# Patient Record
Sex: Female | Born: 1990 | Race: White | Hispanic: No | Marital: Single | Smoking: Never smoker
Health system: Southern US, Community
[De-identification: ages and names within clinical notes are randomized; demographics above are authoritative.]

## PROBLEM LIST (undated history)

## (undated) ENCOUNTER — Inpatient Hospital Stay (HOSPITAL_COMMUNITY): Payer: Self-pay

## (undated) DIAGNOSIS — E119 Type 2 diabetes mellitus without complications: Secondary | ICD-10-CM

## (undated) DIAGNOSIS — F32A Depression, unspecified: Secondary | ICD-10-CM

## (undated) DIAGNOSIS — F419 Anxiety disorder, unspecified: Secondary | ICD-10-CM

## (undated) DIAGNOSIS — I10 Essential (primary) hypertension: Secondary | ICD-10-CM

## (undated) DIAGNOSIS — F319 Bipolar disorder, unspecified: Secondary | ICD-10-CM

## (undated) HISTORY — PX: ADENOIDECTOMY: SUR15

## (undated) HISTORY — PX: TONSILLECTOMY: SUR1361

## (undated) HISTORY — DX: Depression, unspecified: F32.A

## (undated) HISTORY — DX: Bipolar disorder, unspecified: F31.9

---
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
## 2013-11-16 ENCOUNTER — Ambulatory Visit: Payer: Self-pay | Admitting: *Deleted

## 2014-05-12 ENCOUNTER — Encounter (HOSPITAL_COMMUNITY): Payer: Self-pay | Admitting: Emergency Medicine

## 2014-05-12 ENCOUNTER — Emergency Department (HOSPITAL_COMMUNITY)
Admission: EM | Admit: 2014-05-12 | Discharge: 2014-05-12 | Disposition: A | Discharge status: Home / Self Care | Payer: BC Managed Care – PPO | Attending: Emergency Medicine | Admitting: Emergency Medicine

## 2014-05-12 ENCOUNTER — Emergency Department (HOSPITAL_COMMUNITY): Payer: BC Managed Care – PPO

## 2014-05-12 DIAGNOSIS — R059 Cough, unspecified: Secondary | ICD-10-CM

## 2014-05-12 DIAGNOSIS — J45909 Unspecified asthma, uncomplicated: Secondary | ICD-10-CM | POA: Insufficient documentation

## 2014-05-12 DIAGNOSIS — J159 Unspecified bacterial pneumonia: Secondary | ICD-10-CM | POA: Diagnosis not present

## 2014-05-12 DIAGNOSIS — I10 Essential (primary) hypertension: Secondary | ICD-10-CM

## 2014-05-12 DIAGNOSIS — E119 Type 2 diabetes mellitus without complications: Secondary | ICD-10-CM | POA: Insufficient documentation

## 2014-05-12 DIAGNOSIS — R197 Diarrhea, unspecified: Secondary | ICD-10-CM | POA: Diagnosis not present

## 2014-05-12 DIAGNOSIS — J189 Pneumonia, unspecified organism: Secondary | ICD-10-CM

## 2014-05-12 DIAGNOSIS — R05 Cough: Secondary | ICD-10-CM

## 2014-05-12 DIAGNOSIS — R1110 Vomiting, unspecified: Secondary | ICD-10-CM | POA: Insufficient documentation

## 2014-05-12 DIAGNOSIS — R590 Localized enlarged lymph nodes: Secondary | ICD-10-CM | POA: Insufficient documentation

## 2014-05-12 DIAGNOSIS — R509 Fever, unspecified: Secondary | ICD-10-CM | POA: Diagnosis present

## 2014-05-12 HISTORY — DX: Type 2 diabetes mellitus without complications: E11.9

## 2014-05-12 HISTORY — DX: Essential (primary) hypertension: I10

## 2014-05-12 MED ORDER — ALBUTEROL SULFATE HFA 108 (90 BASE) MCG/ACT IN AERS
2.0000 | INHALATION_SPRAY | Freq: Once | RESPIRATORY_TRACT | Status: AC
  Administered 2014-05-12: 2 via RESPIRATORY_TRACT
  Filled 2014-05-12: qty 6.7

## 2014-05-12 MED ORDER — AMOXICILLIN-POT CLAVULANATE 875-125 MG PO TABS: 2.0000 | ORAL_TABLET | Freq: Two times a day (BID) | ORAL | Status: DC

## 2014-05-12 MED ORDER — KETOROLAC TROMETHAMINE 30 MG/ML IJ SOLN
30.0000 mg | Freq: Once | INTRAMUSCULAR | Status: AC
  Administered 2014-05-12: 30 mg via INTRAVENOUS
  Filled 2014-05-12: qty 1

## 2014-05-12 MED ORDER — DOXYCYCLINE HYCLATE 100 MG PO TABS
100.0000 mg | ORAL_TABLET | Freq: Once | ORAL | Status: AC
  Administered 2014-05-12: 100 mg via ORAL
  Filled 2014-05-12: qty 1

## 2014-05-12 MED ORDER — SODIUM CHLORIDE 0.9 % IV BOLUS (SEPSIS)
1000.0000 mL | Freq: Once | INTRAVENOUS | Status: AC
  Administered 2014-05-12: 1000 mL via INTRAVENOUS

## 2014-05-12 MED ORDER — AMOXICILLIN-POT CLAVULANATE 875-125 MG PO TABS
2.0000 | ORAL_TABLET | Freq: Once | ORAL | Status: AC
  Administered 2014-05-12: 2 via ORAL
  Filled 2014-05-12: qty 2

## 2014-05-12 MED ORDER — DOXYCYCLINE HYCLATE 100 MG PO CAPS: 100.0000 mg | ORAL_CAPSULE | Freq: Two times a day (BID) | ORAL | Status: DC

## 2014-05-12 NOTE — Discharge Instructions (Signed)
Return to the emergency room with worsening of symptoms, new symptoms or with symptoms that are concerning , especially getting worse not better, developed chest pain, fevers, stiff neck, worsening headache, nausea/vomiting, visual changes or slurred speech, chest pain, shortness of breath, cough with thick colored mucous or blood Drink plenty of fluids with electrolytes especially Gatorade. OTC cold medications such as mucinex, nyquil, dayquil are recommended. Chloraseptic for sore throat. Make appointment with her primary care provider and one week for recheck and hypertension management. Take your lisinopril when you get home.

## 2014-05-12 NOTE — ED Provider Notes (Signed)
CSN: [id number]     Arrival date & time 05/12/14  [id number] History   First MD Initiated Contact with Patient 05/12/14 [phone]     Chief Complaint  Patient presents with  . Fever  . Emesis     (Consider location/radiation/quality/duration/timing/severity/associated sxs/prior Treatment) HPI  Summer Campbell is a 23 y.o. female with PMH of hypertension, diabetes presenting with 2 days of tactile fevers, chills, emesis and diarrhea. Emesis and diarrhea nonbloody. Stools non-tarry. No hematemesis. She also notes productive cough of thick mucus. No hemoptysis. Mother was similar symptoms. Patient has taken over-the-counter cold medicines with minimal relief. Patient with remote history of asthma but none of COPD and patient does not smoke. Patient denies chest pain, shortness of breath, abdominal pain. No abdominal surgeries. She has chronic back pain without acute worsening. Patient also found to be hypertensive. She is on lisinopril but does not take it because she forgets.   Past Medical History  Diagnosis Date  . Hypertension   . Diabetes mellitus without complication    Past Surgical History  Procedure Laterality Date  . Tonsillectomy    . Adenoidectomy     No family history on file. History  Substance Use Topics  . Smoking status: Never Smoker   . Smokeless tobacco: Not on file  . Alcohol Use: Yes     Comment: occassional   OB History    No data available     Review of Systems  Constitutional: Positive for fever and chills.  HENT: Positive for congestion and rhinorrhea.   Eyes: Negative for visual disturbance.  Respiratory: Positive for cough.   Cardiovascular: Negative for chest pain.  Gastrointestinal: Positive for nausea, vomiting and diarrhea.  Genitourinary: Negative for dysuria and hematuria.  Musculoskeletal: Negative for back pain and gait problem.  Skin: Negative for rash.  Neurological: Negative for weakness and headaches.      Allergies  Review of patient's  allergies indicates no known allergies.  Home Medications   Prior to Admission medications   Medication Sig Start Date End Date Taking? Authorizing Provider  cyclobenzaprine (FLEXERIL) 10 MG tablet Take 10 mg by mouth 3 (three) times daily as needed for muscle spasms.   Yes Historical Provider, MD  HYDROcodone-acetaminophen (NORCO/VICODIN) 5-325 MG per tablet Take 1 tablet by mouth every 6 (six) hours as needed for moderate pain.   Yes Historical Provider, MD  amoxicillin-clavulanate (AUGMENTIN) 875-125 MG per tablet Take 2 tablets by mouth 2 (two) times daily. 05/12/14   Louann SjogrenVictoria L Jarone Ostergaard, PA-C  doxycycline (VIBRAMYCIN) 100 MG capsule Take 1 capsule (100 mg total) by mouth 2 (two) times daily. 05/12/14   Benetta SparVictoria L Francois Elk, PA-C   BP 169/105 mmHg  Pulse 84  Temp(Src) 99.3 F (37.4 C) (Oral)  Resp 15  Ht 5\' 4"  (1.626 m)  Wt 357 lb (161.934 kg)  BMI 61.25 kg/m2  SpO2 96%  LMP 05/01/2014 Physical Exam  Constitutional: She appears well-developed and well-nourished. No distress.  HENT:  Head: Normocephalic and atraumatic.  Mouth/Throat: Mucous membranes are normal. Posterior oropharyngeal erythema present. No oropharyngeal exudate or posterior oropharyngeal edema.  Eyes: Conjunctivae and EOM are normal. Right eye exhibits no discharge. Left eye exhibits no discharge.  Neck: Normal range of motion. Neck supple.  Cardiovascular: Normal rate, regular rhythm and normal heart sounds.   Pulmonary/Chest: Effort normal and breath sounds normal. No respiratory distress. She has no wheezes. She has no rales.  Abdominal: Soft. Bowel sounds are normal. She exhibits no distension. There is no  tenderness.  Lymphadenopathy:    She has cervical adenopathy.  Neurological: She is alert.  Skin: Skin is warm and dry. She is not diaphoretic.  Nursing note and vitals reviewed.   ED Course  Procedures (including critical care time) Labs Review Labs Reviewed - No data to display  Imaging Review Dg  Chest 2 View  05/12/2014   CLINICAL DATA:  Cough.  EXAM: CHEST  2 VIEW  COMPARISON:  None.  FINDINGS: The heart size and mediastinal contours are within normal limits. No pneumothorax or pleural effusion is noted. Left lung is clear. Mild right middle lobe opacity is noted concerning for pneumonia. The visualized skeletal structures are unremarkable.  IMPRESSION: Probable mild right middle lobe pneumonia. Followup radiographs are recommended until resolution.   Electronically Signed   By: Roque LiasJames  Green M.D.   On: 05/12/2014 10:18     EKG Interpretation None      Meds given in ED:  Medications  ketorolac (TORADOL) 30 MG/ML injection 30 mg (30 mg Intravenous Given 05/12/14 0929)  sodium chloride 0.9 % bolus 1,000 mL (0 mLs Intravenous Stopped 05/12/14 1015)  albuterol (PROVENTIL HFA;VENTOLIN HFA) 108 (90 BASE) MCG/ACT inhaler 2 puff (2 puffs Inhalation Given 05/12/14 1052)  doxycycline (VIBRA-TABS) tablet 100 mg (100 mg Oral Given 05/12/14 1050)  amoxicillin-clavulanate (AUGMENTIN) 875-125 MG per tablet 2 tablet (2 tablets Oral Given 05/12/14 1050)    New Prescriptions   AMOXICILLIN-CLAVULANATE (AUGMENTIN) 875-125 MG PER TABLET    Take 2 tablets by mouth 2 (two) times daily.   DOXYCYCLINE (VIBRAMYCIN) 100 MG CAPSULE    Take 1 capsule (100 mg total) by mouth 2 (two) times daily.      MDM   Final diagnoses:  Cough  CAP (community acquired pneumonia)  Essential hypertension   Patient has been diagnosed with CAP via chest xray. Pt is not ill appearing, immunocompromised and therefore I feel like the they can be treated as an OP with abx therapy. Pt with DM and HTN. Will teat for comorbidities with augmentin and doxycycline. Pt has been advised to return to the ED if symptoms worsen or they do not improve. Pt verbalizes understanding and is agreeable with plan. Pt to follow up with PCP. Dose of doxycycline and augmentin in ED and albuterol for cough.  Patient noted to be hypertensive in the  emergency department.  No signs of hypertensive urgency. Pt to take her lisinopril when she gets home. Discussed with patient the need for close follow-up and management by their primary care physician.   Discussed return precautions with patient. Discussed all results and patient verbalizes understanding and agrees with plan.  Case has been discussed with Dr. Criss AlvineGoldston who agrees with the above plan and to discharge.        Louann SjogrenVictoria L Tola Meas, PA-C 05/12/14 1059  Audree CamelScott T Goldston, MD 05/13/14 1550

## 2014-05-12 NOTE — ED Notes (Signed)
Patient coming from home with fever, chills, vomiting and diarrhea with symptoms starting on Tuesday.

## 2015-05-15 ENCOUNTER — Encounter (HOSPITAL_COMMUNITY): Payer: Self-pay | Admitting: *Deleted

## 2015-05-15 DIAGNOSIS — Z792 Long term (current) use of antibiotics: Secondary | ICD-10-CM | POA: Diagnosis not present

## 2015-05-15 DIAGNOSIS — E119 Type 2 diabetes mellitus without complications: Secondary | ICD-10-CM | POA: Insufficient documentation

## 2015-05-15 DIAGNOSIS — Z3202 Encounter for pregnancy test, result negative: Secondary | ICD-10-CM | POA: Insufficient documentation

## 2015-05-15 DIAGNOSIS — N200 Calculus of kidney: Secondary | ICD-10-CM | POA: Diagnosis not present

## 2015-05-15 DIAGNOSIS — I10 Essential (primary) hypertension: Secondary | ICD-10-CM | POA: Insufficient documentation

## 2015-05-15 DIAGNOSIS — R109 Unspecified abdominal pain: Secondary | ICD-10-CM | POA: Diagnosis present

## 2015-05-15 LAB — URINALYSIS, ROUTINE W REFLEX MICROSCOPIC
BILIRUBIN URINE: NEGATIVE
Hgb urine dipstick: NEGATIVE
Ketones, ur: NEGATIVE mg/dL
Leukocytes, UA: NEGATIVE
Nitrite: NEGATIVE
PH: 6 (ref 5.0–8.0)
Protein, ur: NEGATIVE mg/dL
SPECIFIC GRAVITY, URINE: 1.03 (ref 1.005–1.030)

## 2015-05-15 LAB — COMPREHENSIVE METABOLIC PANEL
ALT: 23 U/L (ref 14–54)
ANION GAP: 8 (ref 5–15)
AST: 19 U/L (ref 15–41)
Albumin: 3.4 g/dL — ABNORMAL LOW (ref 3.5–5.0)
Alkaline Phosphatase: 90 U/L (ref 38–126)
BUN: 11 mg/dL (ref 6–20)
CHLORIDE: 102 mmol/L (ref 101–111)
CO2: 28 mmol/L (ref 22–32)
Calcium: 9 mg/dL (ref 8.9–10.3)
Creatinine, Ser: 1.28 mg/dL — ABNORMAL HIGH (ref 0.44–1.00)
GFR calc non Af Amer: 58 mL/min — ABNORMAL LOW (ref >60)
Glucose, Bld: 395 mg/dL — ABNORMAL HIGH (ref 65–99)
Potassium: 4.6 mmol/L (ref 3.5–5.1)
SODIUM: 138 mmol/L (ref 135–145)
Total Bilirubin: 0.3 mg/dL (ref 0.3–1.2)
Total Protein: 7.1 g/dL (ref 6.5–8.1)

## 2015-05-15 LAB — CBC
HCT: 43.2 % (ref 36.0–46.0)
HEMOGLOBIN: 14.2 g/dL (ref 12.0–15.0)
MCH: 28.4 pg (ref 26.0–34.0)
MCHC: 32.9 g/dL (ref 30.0–36.0)
MCV: 86.4 fL (ref 78.0–100.0)
Platelets: 269 10*3/uL (ref 150–400)
RBC: 5 MIL/uL (ref 3.87–5.11)
RDW: 12.7 % (ref 11.5–15.5)
WBC: 12.6 10*3/uL — ABNORMAL HIGH (ref 4.0–10.5)

## 2015-05-15 LAB — URINE MICROSCOPIC-ADD ON

## 2015-05-15 LAB — POC URINE PREG, ED: PREG TEST UR: NEGATIVE

## 2015-05-15 NOTE — ED Notes (Signed)
Patient presents with c/o left flank pain that travels around to the left lower front.  Denies urinary symptoms.  Also has not been taking her BP meds or diabetic meds

## 2015-05-16 ENCOUNTER — Encounter (HOSPITAL_COMMUNITY): Payer: Self-pay | Admitting: Emergency Medicine

## 2015-05-16 ENCOUNTER — Emergency Department (HOSPITAL_COMMUNITY): Payer: BLUE CROSS/BLUE SHIELD

## 2015-05-16 ENCOUNTER — Emergency Department (HOSPITAL_COMMUNITY)
Admission: EM | Admit: 2015-05-16 | Discharge: 2015-05-16 | Disposition: A | Discharge status: Home / Self Care | Payer: BLUE CROSS/BLUE SHIELD | Attending: Emergency Medicine | Admitting: Emergency Medicine

## 2015-05-16 DIAGNOSIS — R52 Pain, unspecified: Secondary | ICD-10-CM

## 2015-05-16 DIAGNOSIS — N200 Calculus of kidney: Secondary | ICD-10-CM

## 2015-05-16 MED ORDER — ONDANSETRON 8 MG PO TBDP: ORAL_TABLET | ORAL | Status: DC

## 2015-05-16 MED ORDER — FENTANYL CITRATE (PF) 100 MCG/2ML IJ SOLN
100.0000 ug | Freq: Once | INTRAMUSCULAR | Status: DC
  Filled 2015-05-16: qty 2

## 2015-05-16 MED ORDER — OXYCODONE-ACETAMINOPHEN 5-325 MG PO TABS
1.0000 | ORAL_TABLET | Freq: Once | ORAL | Status: AC
  Administered 2015-05-16: 1 via ORAL
  Filled 2015-05-16: qty 1

## 2015-05-16 MED ORDER — OXYCODONE-ACETAMINOPHEN 5-325 MG PO TABS: 1.0000 | ORAL_TABLET | Freq: Four times a day (QID) | ORAL | Status: DC | PRN

## 2015-05-16 MED ORDER — TAMSULOSIN HCL 0.4 MG PO CAPS
0.4000 mg | ORAL_CAPSULE | Freq: Every day | ORAL | Status: DC
  Administered 2015-05-16: 0.4 mg via ORAL
  Filled 2015-05-16: qty 1

## 2015-05-16 MED ORDER — DICLOFENAC SODIUM ER 100 MG PO TB24: 100.0000 mg | ORAL_TABLET | Freq: Every day | ORAL | Status: DC

## 2015-05-16 MED ORDER — TAMSULOSIN HCL 0.4 MG PO CAPS: 0.4000 mg | ORAL_CAPSULE | Freq: Every day | ORAL | Status: DC

## 2015-05-16 MED ORDER — AMLODIPINE BESYLATE 5 MG PO TABS
5.0000 mg | ORAL_TABLET | Freq: Once | ORAL | Status: AC
Start: 2015-05-16 — End: 2015-05-16
  Administered 2015-05-16: 5 mg via ORAL
  Filled 2015-05-16: qty 1

## 2015-05-16 MED ORDER — KETOROLAC TROMETHAMINE 60 MG/2ML IM SOLN
60.0000 mg | Freq: Once | INTRAMUSCULAR | Status: AC
  Administered 2015-05-16: 60 mg via INTRAMUSCULAR
  Filled 2015-05-16: qty 2

## 2015-05-16 NOTE — ED Notes (Signed)
Dr. Palumbo at the bedside.  

## 2015-05-16 NOTE — ED Provider Notes (Signed)
CSN: [id number]     Arrival date & time 05/15/15  2132 History  By signing my name below, I, Summer Campbell, attest that this documentation has been prepared under the direction and in the presence of Vinicio Lynk, MD . Electronically Signed: [name], Scribe. 05/16/2015. 1:51 AM.     Chief Complaint  Patient presents with  . Abdominal Pain  . Back Pain   Patient is a 24 y.o. female presenting with back pain. The history is provided by the patient. No language interpreter was used.  Back Pain Location:  Lumbar spine (Left sided) Quality:  Stabbing Radiates to: left groin. Pain severity:  Moderate Onset quality:  Gradual Timing:  Constant Progression:  Unchanged Chronicity:  New Context: not MVA   Relieved by:  Nothing Worsened by:  Nothing tried Ineffective treatments:  Ibuprofen, cold packs and heating pad Associated symptoms: no bladder incontinence, no bowel incontinence, no dysuria, no fever and no paresthesias   Risk factors: obesity      HPI Comments:  Summer Campbell is a 24 y.o. female with a history of HTN and DM, who presents to the Emergency Department complaining of moderate left sided back pain for ~ 4 days. She has applied a heating pad and ice  without relief. She has also taken ibuprofen without relief. Pt reports h/o back pain but notes pain today is dissimilar to past episodes due to the intensity and the localization of the pain. She denies diarrhea, constipation, hematuria, dysuria, and vaginal discharge. LNMP ~ 1 week ago. Last normal BM ~ 2300. No alleviating factors noted. Pt is supposed to be taking metformin and lisinopril but is non-complaint.  Past Medical History  Diagnosis Date  . Hypertension   . Diabetes mellitus without complication [facility])    Past Surgical History  Procedure Laterality Date  . Tonsillectomy    . Adenoidectomy     History reviewed. No pertinent family history. Social History  Substance Use Topics  . Smoking status: Never  Smoker   . Smokeless tobacco: Never Used  . Alcohol Use: Yes     Comment: occassional   OB History    No data available     Review of Systems  Constitutional: Negative for fever.  Gastrointestinal: Negative for bowel incontinence.  Genitourinary: Positive for flank pain. Negative for bladder incontinence, dysuria and hematuria.  Musculoskeletal: Positive for back pain.  Neurological: Negative for paresthesias.  All other systems reviewed and are negative.   Allergies  Review of patient's allergies indicates no known allergies.  Home Medications   Prior to Admission medications   Medication Sig Start Date End Date Taking? Authorizing Provider  amoxicillin-clavulanate (AUGMENTIN) 875-125 MG per tablet Take 2 tablets by mouth 2 (two) times daily. 05/12/14   Oswaldo Conroy, PA-C  cyclobenzaprine (FLEXERIL) 10 MG tablet Take 10 mg by mouth 3 (three) times daily as needed for muscle spasms.    Historical Provider, MD  doxycycline (VIBRAMYCIN) 100 MG capsule Take 1 capsule (100 mg total) by mouth 2 (two) times daily. 05/12/14   Oswaldo Conroy, PA-C  HYDROcodone-acetaminophen (NORCO/VICODIN) 5-325 MG per tablet Take 1 tablet by mouth every 6 (six) hours as needed for moderate pain.    Historical Provider, MD   BP 160/97 mmHg  Pulse 81  Temp(Src) 98.6 F (37 C) (Oral)  Resp 16  Ht  (1.626 m)  Wt 336 lb 1 oz (152.437 kg)  BMI 57.66 kg/m2  SpO2 98%  LMP 05/05/2015 Physical Exam  Constitutional: She  is oriented to person, place, and time. She appears well-developed and well-nourished. No distress.  HENT:  Head: Normocephalic and atraumatic.  Mouth/Throat: Oropharynx is clear and moist. No oropharyngeal exudate.  Moist mucous membranes   Eyes: Conjunctivae are normal. Pupils are equal, round, and reactive to light.  Neck: Normal range of motion. Neck supple. No JVD present.  Trachea midline  Cardiovascular: Normal rate, regular rhythm and normal heart sounds.    Pulmonary/Chest: Effort normal and breath sounds normal. No respiratory distress.  Abdominal: Soft. She exhibits no distension. There is no tenderness. There is no rebound and no guarding.  Hyperactive BS Gassy throughout   Musculoskeletal: Normal range of motion.  Neurological: She is alert and oriented to person, place, and time. She has normal reflexes.  Skin: Skin is warm and dry.  Psychiatric: She has a normal mood and affect. Her behavior is normal.  Nursing note and vitals reviewed.   ED Course  Procedures   DIAGNOSTIC STUDIES:  Oxygen Saturation is 100% on RA, normal by my interpretation.    COORDINATION OF CARE:  1:28 AM Discussed treatment plan with pt at bedside and pt agreed to plan.  Labs Review Labs Reviewed  COMPREHENSIVE METABOLIC PANEL - Abnormal; Notable for the following:    Glucose, Bld 395 (*)    Creatinine, Ser 1.28 (*)    Albumin 3.4 (*)    GFR calc non Af Amer 58 (*)    All other components within normal limits  CBC - Abnormal; Notable for the following:    WBC 12.6 (*)    All other components within normal limits  URINALYSIS, ROUTINE W REFLEX MICROSCOPIC (NOT AT [facility]) - Abnormal; Notable for the following:    Glucose, UA >1000 (*)    All other components within normal limits  URINE MICROSCOPIC-ADD ON - Abnormal; Notable for the following:    Squamous Epithelial / LPF 0-5 (*)    Bacteria, UA FEW (*)    All other components within normal limits  POC URINE PREG, ED    Imaging Review Ct Renal Stone Study  05/16/2015  CLINICAL DATA:  left flank pain radiating into back w/ n/v started 2 days ago and has been intermittent but got really sharp and stabbing this afternoon no h/o stones in past. EXAM: CT ABDOMEN AND PELVIS WITHOUT CONTRAST TECHNIQUE: Multidetector CT imaging of the abdomen and pelvis was performed following the standard protocol without IV contrast. COMPARISON:  None. FINDINGS: The lung bases are clear. Left kidney is enlarged. Mild  hydronephrosis and hydroureter on the left. Stone in the mid left ureter at the level of the sacrum measuring 6 mm diameter. There is infiltration around the ureter. Right kidney in ureter is decompressed and no stones are demonstrated on the right. No bladder stones or bladder wall thickening. The unenhanced appearance of the gallbladder, spleen, adrenal glands, abdominal aorta, inferior vena cava, and retroperitoneal lymph nodes is unremarkable. Mild diffuse fatty infiltration of the liver. Multiple accessory spleens. Stomach, small bowel, and colon are mostly decompressed. Pelvis: Uterus and ovaries are not enlarged. Appendix is normal. No free or loculated pelvic fluid collections. No pelvic mass or lymphadenopathy. No evidence of diverticulitis. Mild degenerative changes in the spine. No destructive bone lesions. IMPRESSION: 6 mm stone in the mid left ureter with moderate proximal obstruction. Electronically Signed   By: Burman Nieves M.D.   On: 05/16/2015 03:07   I have personally reviewed and evaluated these images and lab results as part of my medical  decision-making.   EKG Interpretation None      MDM   Final diagnoses:  Pain    Results for orders placed or performed during the hospital encounter of 05/16/15  Comprehensive metabolic panel  Result Value Ref Range   Sodium 138 135 - 145 mmol/L   Potassium 4.6 3.5 - 5.1 mmol/L   Chloride 102 101 - 111 mmol/L   CO2 28 22 - 32 mmol/L   Glucose, Bld 395 (H) 65 - 99 mg/dL   BUN 11 6 - 20 mg/dL   Creatinine, Ser 1.61 (H) 0.44 - 1.00 mg/dL   Calcium 9.0 8.9 - 09.6 mg/dL   Total Protein 7.1 6.5 - 8.1 g/dL   Albumin 3.4 (L) 3.5 - 5.0 g/dL   AST 19 15 - 41 U/L   ALT 23 14 - 54 U/L   Alkaline Phosphatase 90 38 - 126 U/L   Total Bilirubin 0.3 0.3 - 1.2 mg/dL   GFR calc non Af Amer 58 (L) >60 mL/min   GFR calc Af Amer >60 >60 mL/min   Anion gap 8 5 - 15  CBC  Result Value Ref Range   WBC 12.6 (H) 4.0 - 10.5 K/uL   RBC 5.00 3.87 -  5.11 MIL/uL   Hemoglobin 14.2 12.0 - 15.0 g/dL   HCT 04.5 40.9 - 81.1 %   MCV 86.4 78.0 - 100.0 fL   MCH 28.4 26.0 - 34.0 pg   MCHC 32.9 30.0 - 36.0 g/dL   RDW 91.4 78.2 - 95.6 %   Platelets 269 150 - 400 K/uL  Urinalysis, Routine w reflex microscopic (not at [facility])  Result Value Ref Range   Color, Urine YELLOW YELLOW   APPearance CLEAR CLEAR   Specific Gravity, Urine 1.030 1.005 - 1.030   pH 6.0 5.0 - 8.0   Glucose, UA >1000 (A) NEGATIVE mg/dL   Hgb urine dipstick NEGATIVE NEGATIVE   Bilirubin Urine NEGATIVE NEGATIVE   Ketones, ur NEGATIVE NEGATIVE mg/dL   Protein, ur NEGATIVE NEGATIVE mg/dL   Nitrite NEGATIVE NEGATIVE   Leukocytes, UA NEGATIVE NEGATIVE  Urine microscopic-add on  Result Value Ref Range   Squamous Epithelial / LPF 0-5 (A) NONE SEEN   WBC, UA 0-5 0 - 5 WBC/hpf   RBC / HPF 0-5 0 - 5 RBC/hpf   Bacteria, UA FEW (A) NONE SEEN  POC urine preg, ED (not at [facility])  Result Value Ref Range   Preg Test, Ur NEGATIVE NEGATIVE   Ct Renal Stone Study  05/16/2015  CLINICAL DATA:  left flank pain radiating into back w/ n/v started 2 days ago and has been intermittent but got really sharp and stabbing this afternoon no h/o stones in past. EXAM: CT ABDOMEN AND PELVIS WITHOUT CONTRAST TECHNIQUE: Multidetector CT imaging of the abdomen and pelvis was performed following the standard protocol without IV contrast. COMPARISON:  None. FINDINGS: The lung bases are clear. Left kidney is enlarged. Mild hydronephrosis and hydroureter on the left. Stone in the mid left ureter at the level of the sacrum measuring 6 mm diameter. There is infiltration around the ureter. Right kidney in ureter is decompressed and no stones are demonstrated on the right. No bladder stones or bladder wall thickening. The unenhanced appearance of the gallbladder, spleen, adrenal glands, abdominal aorta, inferior vena cava, and retroperitoneal lymph nodes is unremarkable. Mild diffuse fatty infiltration of the liver.  Multiple accessory spleens. Stomach, small bowel, and colon are mostly decompressed. Pelvis: Uterus and ovaries are not enlarged. Appendix  is normal. No free or loculated pelvic fluid collections. No pelvic mass or lymphadenopathy. No evidence of diverticulitis. Mild degenerative changes in the spine. No destructive bone lesions. IMPRESSION: 6 mm stone in the mid left ureter with moderate proximal obstruction. Electronically Signed   By: Burman NievesWilliam  Stevens M.D.   On: 05/16/2015 03:07    Medications  tamsulosin (FLOMAX) capsule 0.4 mg (0.4 mg Oral Given 05/16/15 0409)  fentaNYL (SUBLIMAZE) injection 100 mcg (100 mcg Intravenous Not Given 05/16/15 0407)  ketorolac (TORADOL) injection 60 mg (60 mg Intramuscular Given 05/16/15 0132)  amLODipine (NORVASC) tablet 5 mg (5 mg Oral Given 05/16/15 0132)  oxyCODONE-acetaminophen (PERCOCET/ROXICET) 5-325 MG per tablet 1 tablet (1 tablet Oral Given 05/16/15 0415)   Kidney stone: Strain all urine follow up in 7 days with urology.  Restart your metformin and lisinopril.  Zofran prn nausea voltaren, flomax and percocet.  No driving or drinking alcohol while on this medication.  Strict pain precautions given   I personally performed the services described in this documentation, which was scribed in my presence. The recorded information has been reviewed and is accurate.     Cy BlamerApril Simone Tuckey, MD 05/16/15 [phone]

## 2015-05-16 NOTE — ED Notes (Signed)
Pt also does not take her metformin

## 2015-05-16 NOTE — ED Notes (Signed)
Pt left with all her belongings and ambulated out of the treatment area.  

## 2015-05-16 NOTE — ED Notes (Signed)
Pt is on lisionpril but does not take it. Lengthy conversation about this with her

## 2016-07-08 DIAGNOSIS — E1165 Type 2 diabetes mellitus with hyperglycemia: Secondary | ICD-10-CM | POA: Diagnosis not present

## 2016-07-08 DIAGNOSIS — I10 Essential (primary) hypertension: Secondary | ICD-10-CM | POA: Diagnosis not present

## 2016-07-08 DIAGNOSIS — M79671 Pain in right foot: Secondary | ICD-10-CM | POA: Diagnosis not present

## 2016-07-18 ENCOUNTER — Ambulatory Visit (INDEPENDENT_AMBULATORY_CARE_PROVIDER_SITE_OTHER): Payer: Commercial Managed Care - HMO | Admitting: Podiatry

## 2016-07-18 ENCOUNTER — Telehealth: Payer: Self-pay | Admitting: *Deleted

## 2016-07-18 ENCOUNTER — Encounter: Payer: Self-pay | Admitting: Podiatry

## 2016-07-18 ENCOUNTER — Ambulatory Visit (INDEPENDENT_AMBULATORY_CARE_PROVIDER_SITE_OTHER): Payer: Commercial Managed Care - HMO

## 2016-07-18 VITALS — Ht 65.0 in | Wt 323.0 lb

## 2016-07-18 DIAGNOSIS — E118 Type 2 diabetes mellitus with unspecified complications: Secondary | ICD-10-CM

## 2016-07-18 DIAGNOSIS — E119 Type 2 diabetes mellitus without complications: Secondary | ICD-10-CM | POA: Diagnosis not present

## 2016-07-18 DIAGNOSIS — M779 Enthesopathy, unspecified: Secondary | ICD-10-CM | POA: Diagnosis not present

## 2016-07-18 DIAGNOSIS — M722 Plantar fascial fibromatosis: Secondary | ICD-10-CM | POA: Diagnosis not present

## 2016-07-18 NOTE — Patient Instructions (Addendum)
Plantar Fasciitis Rehab Ask your health care provider which exercises are safe for you. Do exercises exactly as told by your health care provider and adjust them as directed. It is normal to feel mild stretching, pulling, tightness, or discomfort as you do these exercises, but you should stop right away if you feel sudden pain or your pain gets worse. Do not begin these exercises until told by your health care provider. Stretching and range of motion exercises These exercises warm up your muscles and joints and improve the movement and flexibility of your foot. These exercises also help to relieve pain. Exercise A: Plantar fascia stretch 1. Sit with your left / right leg crossed over your opposite knee. 2. Hold your heel with one hand with that thumb near your arch. With your other hand, hold your toes and gently pull them back toward the top of your foot. You should feel a stretch on the bottom of your toes or your foot or both. 3. Hold this stretch for__________ seconds. 4. Slowly release your toes and return to the starting position. Repeat __________ times. Complete this exercise __________ times a day. Exercise B: Gastroc, standing 1. Stand with your hands against a wall. 2. Extend your left / right leg behind you, and bend your front knee slightly. 3. Keeping your heels on the floor and keeping your back knee straight, shift your weight toward the wall without arching your back. You should feel a gentle stretch in your left / right calf. 4. Hold this position for __________ seconds. Repeat __________ times. Complete this exercise __________ times a day. Exercise C: Soleus, standing 1. Stand with your hands against a wall. 2. Extend your left / right leg behind you, and bend your front knee slightly. 3. Keeping your heels on the floor, bend your back knee and slightly shift your weight over the back leg. You should feel a gentle stretch deep in your calf. 4. Hold this position for __________  seconds. Repeat __________ times. Complete this exercise __________ times a day. Exercise D: Gastrocsoleus, standing 1. Stand with the ball of your left / right foot on a step. The ball of your foot is on the walking surface, right under your toes. 2. Keep your other foot firmly on the same step. 3. Hold onto the wall or a railing for balance. 4. Slowly lift your other foot, allowing your body weight to press your heel down over the edge of the step. You should feel a stretch in your left / right calf. 5. Hold this position for __________ seconds. 6. Return both feet to the step. 7. Repeat this exercise with a slight bend in your left / right knee. Repeat __________ times with your left / right knee straight and __________ times with your left / right knee bent. Complete this exercise __________ times a day. Balance exercise This exercise builds your balance and strength control of your arch to help take pressure off your plantar fascia. Exercise E: Single leg stand 1. Without shoes, stand near a railing or in a doorway. You may hold onto the railing or door frame as needed. 2. Stand on your left / right foot. Keep your big toe down on the floor and try to keep your arch lifted. Do not let your foot roll inward. 3. Hold this position for __________ seconds. 4. If this exercise is too easy, you can try it with your eyes closed or while standing on a pillow. Repeat __________ times. Complete this exercise   __________ times a day. This information is not intended to replace advice given to you by your health care provider. Make sure you discuss any questions you have with your health care provider. Document Released: 05/20/2005 Document Revised: 01/23/2016 Document Reviewed: 04/03/2015 Elsevier Interactive Patient Education  2017 [employer]    Diabetes and Foot Care Diabetes may cause you to have problems because of poor blood supply (circulation) to your feet and legs. This may cause the  skin on your feet to become thinner, break easier, and heal more slowly. Your skin may become dry, and the skin may peel and crack. You may also have nerve damage in your legs and feet causing decreased feeling in them. You may not notice minor injuries to your feet that could lead to infections or more serious problems. Taking care of your feet is one of the most important things you can do for yourself. Follow these instructions at home:  Wear shoes at all times, even in the house. Do not go barefoot. Bare feet are easily injured.  Check your feet daily for blisters, cuts, and redness. If you cannot see the bottom of your feet, use a mirror or ask someone for help.  Wash your feet with warm water (do not use hot water) and mild soap. Then pat your feet and the areas between your toes until they are completely dry. Do not soak your feet as this can dry your skin.  Apply a moisturizing lotion or petroleum jelly (that does not contain alcohol and is unscented) to the skin on your feet and to dry, brittle toenails. Do not apply lotion between your toes.  Trim your toenails straight across. Do not dig under them or around the cuticle. File the edges of your nails with an emery board or nail file.  Do not cut corns or calluses or try to remove them with medicine.  Wear clean socks or stockings every day. Make sure they are not too tight. Do not wear knee-high stockings since they may decrease blood flow to your legs.  Wear shoes that fit properly and have enough cushioning. To break in new shoes, wear them for just a few hours a day. This prevents you from injuring your feet. Always look in your shoes before you put them on to be sure there are no objects inside.  Do not cross your legs. This may decrease the blood flow to your feet.  If you find a minor scrape, cut, or break in the skin on your feet, keep it and the skin around it clean and dry. These areas may be cleansed with mild soap and water.  Do not cleanse the area with peroxide, alcohol, or iodine.  When you remove an adhesive bandage, be sure not to damage the skin around it.  If you have a wound, look at it several times a day to make sure it is healing.  Do not use heating pads or hot water bottles. They may burn your skin. If you have lost feeling in your feet or legs, you may not know it is happening until it is too late.  Make sure your health care provider performs a complete foot exam at least annually or more often if you have foot problems. Report any cuts, sores, or bruises to your health care provider immediately. Contact a health care provider if:  You have an injury that is not healing.  You have cuts or breaks in the skin.  You have an ingrown  nail.  You notice redness on your legs or feet.  You feel burning or tingling in your legs or feet.  You have pain or cramps in your legs and feet.  Your legs or feet are numb.  Your feet always feel cold. Get help right away if:  There is increasing redness, swelling, or pain in or around a wound.  There is a red line that goes up your leg.  Pus is coming from a wound.  You develop a fever or as directed by your health care provider.  You notice a bad smell coming from an ulcer or wound. This information is not intended to replace advice given to you by your health care provider. Make sure you discuss any questions you have with your health care provider. Document Released: 05/17/2000 Document Revised: 10/26/2015 Document Reviewed: 10/27/2012 Elsevier Interactive Patient Education  2017 [employer].

## 2016-07-18 NOTE — Progress Notes (Signed)
   Subjective:    Patient ID: Summer Campbell, female    DOB: 07/16/1990, 26 y.o.   MRN: [id number]  HPI  26 year old female presents the office today for concerns her right foot pain, a mass in the bottom of her right foot which is been ongoing for several years. She states that she is diabetic and she is trying start taking better care of herself. She states that she's clustered dad and other family members had multiple complications diabetes and should to start to do things to help prevent this from happening. Her blood sugars have run in the mid 200s. She denies any recent injury or trauma to her right foot. She has noticed a knot on the arch which does get painful at times with pressure. Denies any recent treatment. No injury. No other complaints.  Review of Systems  HENT: Positive for sinus pressure, sneezing and sore throat.   Musculoskeletal: Positive for back pain.  All other systems reviewed and are negative.      Objective:   Physical Exam General: AAO x3, NAD  Dermatological: Skin is warm, dry and supple bilateral. Nails x 10 are well manicured; remaining integument appears unremarkable at this time. There are no open sores, no preulcerative lesions, no rash or signs of infection present.  Vascular: Dorsalis Pedis artery and Posterior Tibial artery pedal pulses are 2/4 bilateral with immedate capillary fill time. Pedal hair growth present.  There is no pain with calf compression, swelling, warmth, erythema.   Neruologic: Grossly intact via light touch bilateral. Vibratory intact via tuning fork bilateral. Protective threshold with Semmes Wienstein monofilament intact to all pedal sites bilateral.  Musculoskeletal: Firm palpable non-mobile soft tissue masses present on the right foot on the medial arch of the foot on the medial band of the plantar fascia. There is no pain to palpation to the area today however she states it only hurts occasionally with walking and pressure. There is  no overlying edema, erythema or any skin change. Muscular strength 5/5 in all groups tested bilateral.  Gait: Unassisted, Nonantalgic.     Assessment & Plan:  26 year old female right foot plantar fibroma; diabetes mellitus -Treatment options discussed including all alternatives, risks, and complications -Etiology of symptoms were discussed -X-rays were obtained and reviewed with the patient. No evidence of acute fracture. No evidence for body identified today. -Order to compound cream to include verapamil for the plantar fibroma on the right foot. Also discussed stretching exercises and icing. -Discussed a diabetic education class for her and she is willing to go. We will put ina referral for her. Also discussed exercise and the importance of maintaining a healthy lifestyle. She states that she needs help with this and some guidance.   Ovid CurdMatthew Wagoner, DPM

## 2016-07-19 MED ORDER — NONFORMULARY OR COMPOUNDED ITEM: 2 refills | Status: DC

## 2016-07-19 NOTE — Telephone Encounter (Addendum)
[facility], clinicals, and demographics. Had difficulty faxing referral. Britta MccreedyBarbara - [facility] states she received the pt's referral through the que.

## 2016-07-19 NOTE — Addendum Note (Signed)
Addended by: Alphia Kava'CONNELL, Verble Styron D on: 07/19/2016 09:12 AM   Modules accepted: Orders

## 2016-08-05 DIAGNOSIS — E1165 Type 2 diabetes mellitus with hyperglycemia: Secondary | ICD-10-CM | POA: Diagnosis not present

## 2016-08-05 DIAGNOSIS — I10 Essential (primary) hypertension: Secondary | ICD-10-CM | POA: Diagnosis not present

## 2016-08-05 DIAGNOSIS — E785 Hyperlipidemia, unspecified: Secondary | ICD-10-CM | POA: Diagnosis not present

## 2017-01-16 DIAGNOSIS — I10 Essential (primary) hypertension: Secondary | ICD-10-CM | POA: Diagnosis not present

## 2017-01-16 DIAGNOSIS — E785 Hyperlipidemia, unspecified: Secondary | ICD-10-CM | POA: Diagnosis not present

## 2017-01-16 DIAGNOSIS — Z23 Encounter for immunization: Secondary | ICD-10-CM | POA: Diagnosis not present

## 2017-01-16 DIAGNOSIS — E1165 Type 2 diabetes mellitus with hyperglycemia: Secondary | ICD-10-CM | POA: Diagnosis not present

## 2017-02-24 DIAGNOSIS — Z23 Encounter for immunization: Secondary | ICD-10-CM | POA: Diagnosis not present

## 2017-02-24 DIAGNOSIS — E1165 Type 2 diabetes mellitus with hyperglycemia: Secondary | ICD-10-CM | POA: Diagnosis not present

## 2017-02-24 DIAGNOSIS — I10 Essential (primary) hypertension: Secondary | ICD-10-CM | POA: Diagnosis not present

## 2017-03-05 DIAGNOSIS — E1165 Type 2 diabetes mellitus with hyperglycemia: Secondary | ICD-10-CM | POA: Diagnosis not present

## 2017-04-04 ENCOUNTER — Emergency Department (HOSPITAL_COMMUNITY)
Admission: EM | Admit: 2017-04-04 | Discharge: 2017-04-04 | Disposition: A | Discharge status: Home / Self Care | Payer: Worker's Compensation | Attending: Emergency Medicine | Admitting: Emergency Medicine

## 2017-04-04 ENCOUNTER — Encounter (HOSPITAL_COMMUNITY): Payer: Self-pay

## 2017-04-04 ENCOUNTER — Emergency Department (HOSPITAL_COMMUNITY): Payer: Worker's Compensation

## 2017-04-04 DIAGNOSIS — Z79899 Other long term (current) drug therapy: Secondary | ICD-10-CM | POA: Diagnosis not present

## 2017-04-04 DIAGNOSIS — E119 Type 2 diabetes mellitus without complications: Secondary | ICD-10-CM | POA: Insufficient documentation

## 2017-04-04 DIAGNOSIS — W19XXXA Unspecified fall, initial encounter: Secondary | ICD-10-CM

## 2017-04-04 DIAGNOSIS — M791 Myalgia, unspecified site: Secondary | ICD-10-CM

## 2017-04-04 DIAGNOSIS — I10 Essential (primary) hypertension: Secondary | ICD-10-CM | POA: Insufficient documentation

## 2017-04-04 DIAGNOSIS — M545 Low back pain, unspecified: Secondary | ICD-10-CM

## 2017-04-04 LAB — POC URINE PREG, ED: PREG TEST UR: NEGATIVE

## 2017-04-04 MED ORDER — CYCLOBENZAPRINE HCL 5 MG PO TABS: 5.0000 mg | ORAL_TABLET | Freq: Two times a day (BID) | ORAL | 0 refills | Status: DC | PRN

## 2017-04-04 MED ORDER — NAPROXEN 500 MG PO TABS: 500.0000 mg | ORAL_TABLET | Freq: Two times a day (BID) | ORAL | 0 refills | Status: DC

## 2017-04-04 NOTE — ED Triage Notes (Signed)
Pt reports tailbone and lower back pain secondary to a fall yesterday morning in which she slipped and landed on her bottom. She denies head injury, LOC. Pt is ambulatory.

## 2017-04-04 NOTE — ED Notes (Signed)
Patient verbalized understanding of discharge instructions and denies any further needs or questions at this time. VS stable. Patient ambulatory with steady gait.  

## 2017-04-04 NOTE — ED Provider Notes (Signed)
[facility] EMERGENCY DEPARTMENT Provider Note   CSN: [id number] Arrival date & time: 04/04/17  1009     History   Chief Complaint Chief Complaint  Patient presents with  . Fall    HPI Summer Campbell is a 26 y.o. female presenting with coccyx pain after a fall.  Patient states she was at work yesterday when she slipped on the ramp, landing on her tailbone and hitting her upper back.  She twisted so she did not hit her head, and landed on her arm.  She reports persistent coccyx pain since the fall.  It is worse when she is sitting or changing position, improved with standing.  She currently denies pain elsewhere including her upper back and arm.  She denies hitting her head.  Denies head or neck pain.  She denies numbness or tingling.  She denies loss of bowel or bladder control.  She has not taking anything for pain today.  She is ambulatory.   HPI  Past Medical History:  Diagnosis Date  . Diabetes mellitus without complication (HCC)   . Hypertension     There are no active problems to display for this patient.   Past Surgical History:  Procedure Laterality Date  . ADENOIDECTOMY    . TONSILLECTOMY      OB History    No data available       Home Medications    Prior to Admission medications   Medication Sig Start Date End Date Taking? Authorizing Provider  cyclobenzaprine (FLEXERIL) 5 MG tablet Take 1 tablet (5 mg total) by mouth 2 (two) times daily as needed for muscle spasms. 04/04/17   Mistie Adney, PA-C  Diclofenac Sodium CR (VOLTAREN-XR) 100 MG 24 hr tablet Take 1 tablet (100 mg total) by mouth daily. 05/16/15   Palumbo, April, MD  HYDROcodone-acetaminophen (NORCO/VICODIN) 5-325 MG per tablet Take 1 tablet by mouth every 6 (six) hours as needed for moderate pain.    [provider]  lisinopril-hydrochlorothiazide (PRINZIDE,ZESTORETIC) 10-12.5 MG tablet Take 1 tablet by mouth daily.    [provider]  metFORMIN  (GLUCOPHAGE) 500 MG tablet Take by mouth 2 (two) times daily with a meal.    [provider]  naproxen (NAPROSYN) 500 MG tablet Take 1 tablet (500 mg total) by mouth 2 (two) times daily with a meal. 04/04/17   Nickole Adamek, PA-C  NONFORMULARY OR COMPOUNDED ITEM [facility]: Scar Cream - Verapamil 10%, Pentoxifylline 5%, apply 1-2 grams to affected area 3-4 times daily. 07/19/16   Vivi Barrack, DPM  ondansetron (ZOFRAN ODT) 8 MG disintegrating tablet 8mg  ODT q8 hours prn nausea Patient not taking: Reported on 07/18/2016 05/16/15   Palumbo, April, MD  oxyCODONE-acetaminophen (PERCOCET) 5-325 MG tablet Take 1 tablet by mouth every 6 (six) hours as needed. Patient not taking: Reported on 07/18/2016 05/16/15   Palumbo, April, MD  rosuvastatin (CRESTOR) 10 MG tablet Take 10 mg by mouth daily.    [provider]  tamsulosin (FLOMAX) 0.4 MG CAPS capsule Take 1 capsule (0.4 mg total) by mouth daily. Patient not taking: Reported on 07/18/2016 05/16/15   Palumbo, April, MD  tiZANidine (ZANAFLEX) 2 MG tablet Take by mouth every 6 (six) hours as needed for muscle spasms.    [provider]    Family History No family history on file.  Social History Social History  Substance Use Topics  . Smoking status: Never Smoker  . Smokeless tobacco: Never Used  . Alcohol use Yes  Comment: occassional     Allergies   Patient has no known allergies.   Review of Systems Review of Systems  Musculoskeletal: Positive for back pain. Negative for gait problem.  Neurological: Negative for numbness.     Physical Exam Updated Vital Signs BP (!) 146/98 (BP Location: Left Arm)   Pulse 73   Temp 98 F (36.7 C) (Oral)   Resp 18   LMP 03/12/2017   SpO2 100%   Physical Exam  Constitutional: She is oriented to person, place, and time. She appears well-developed and well-nourished. No distress.  HENT:  Head: Normocephalic and atraumatic.  Eyes: EOM are normal.    Neck: Normal range of motion.  No tenderness to palpation of midline cervical spine.  Full active range of motion of head without pain.  Cardiovascular: Normal rate, regular rhythm and intact distal pulses.   Pulmonary/Chest: Effort normal and breath sounds normal. No respiratory distress. She has no wheezes. She exhibits no tenderness.  Abdominal: Soft. She exhibits no distension. There is no tenderness.  Musculoskeletal: She exhibits tenderness.  Tenderness to palpation of low back midline and bilaterally.  Patient is ambulatory.  Strength of lower extremities intact bilaterally.  Sensation intact bilaterally.  Pedal pulses intact bilaterally.  No obvious deformity, contusion, or injury.  No pain of upper extremities.  Neurological: She is alert and oriented to person, place, and time. No sensory deficit.  Skin: Skin is warm and dry.  Psychiatric: She has a normal mood and affect.  Nursing note and vitals reviewed.    ED Treatments / Results  Labs (all labs ordered are listed, but only abnormal results are displayed) Labs Reviewed  POC URINE PREG, ED    EKG  EKG Interpretation None       Radiology Dg Lumbar Spine Complete  Result Date: 04/04/2017 CLINICAL DATA:  Acute low back pain following fall today. Initial encounter. EXAM: LUMBAR SPINE - COMPLETE 4+ VIEW COMPARISON:  05/16/2015 CT FINDINGS: There is no evidence of fracture or subluxation. Lumbar disc spaces are maintained. Mild degenerative disc disease at T11-12 and T12-L1 again noted. No focal bony lesions or spondylolysis noted. IMPRESSION: No evidence of acute abnormality. Electronically Signed   By: Harmon PierJeffrey  Hu M.D.   On: 04/04/2017 15:15   Dg Sacrum/coccyx  Result Date: 04/04/2017 CLINICAL DATA:  Acute sacral and coccyx pain following fall today. Initial encounter. EXAM: SACRUM AND COCCYX - 2+ VIEW COMPARISON:  05/16/2015 CT FINDINGS: There is no evidence of fracture or other focal bone lesions. IMPRESSION: Negative.  Electronically Signed   By: Harmon PierJeffrey  Hu M.D.   On: 04/04/2017 15:16    Procedures Procedures (including critical care time)  Medications Ordered in ED Medications - No data to display   Initial Impression / Assessment and Plan / ED Course  I have reviewed the triage vital signs and the nursing notes.  Pertinent labs & imaging results that were available during my care of the patient were reviewed by me and considered in my medical decision making (see chart for details).     Patient presenting with low back/coccyx pain after a fall yesterday.  She is neurovascularly intact without obvious neurologic deficits.  Will order x-ray for further evaluation.  As she is 25 and getting x-ray pelvis, will obtain urine pregnancy prior to imaging.  Pregnancy negative.  X-ray without acute abnormality including fracture or dislocation.  Discussed findings with patient.  Discussed likely muscular pain.  We will treat conservatively with anti-inflammatories and muscle relaxers.  Patient to follow-up with primary care if symptoms are not improving.  At this time, patient appears to be discharged.  Return precautions given.  Patient states she understands and agrees to plan.   Final Clinical Impressions(s) / ED Diagnoses   Final diagnoses:  Fall  Acute bilateral low back pain without sciatica  Muscle pain    New Prescriptions Discharge Medication List as of 04/04/2017  3:54 PM    START taking these medications   Details  naproxen (NAPROSYN) 500 MG tablet Take 1 tablet (500 mg total) by mouth 2 (two) times daily with a meal., Starting Fri 04/04/2017, Print         Lockhart, [place], PA-C 04/04/17 1825    Bethann Berkshire, MD 04/05/17 1200

## 2017-04-04 NOTE — Discharge Instructions (Signed)
Take naproxen twice a day with meals.  Do not take other anti-inflammatories at the same time (Advil, Motrin, ibuprofen, Aleve).  You may supplement with Tylenol if you need further pain control. Use Flexeril twice a day as needed for muscle stiffness or soreness.  Have caution, as this may make you tired.  Do not drive or operate heavy machinery until you know how this affects you. You may use ice or heat if this helps control your pain. Follow-up with your primary care doctor in 1 week if your pain is not improving. Return to the emergency room if you develop loss of bowel or bladder control, numbness, or any new or worsening symptoms.

## 2018-02-28 IMAGING — DX DG SACRUM/COCCYX 2+V
3 series · 3 of 3 positions shown · non-contrast
Comparison: 05/16/2015 CT

CLINICAL DATA: Acute sacral and coccyx pain following fall today.
Initial encounter.

EXAM:
SACRUM AND COCCYX - 2+ VIEW

[coccyx ap]
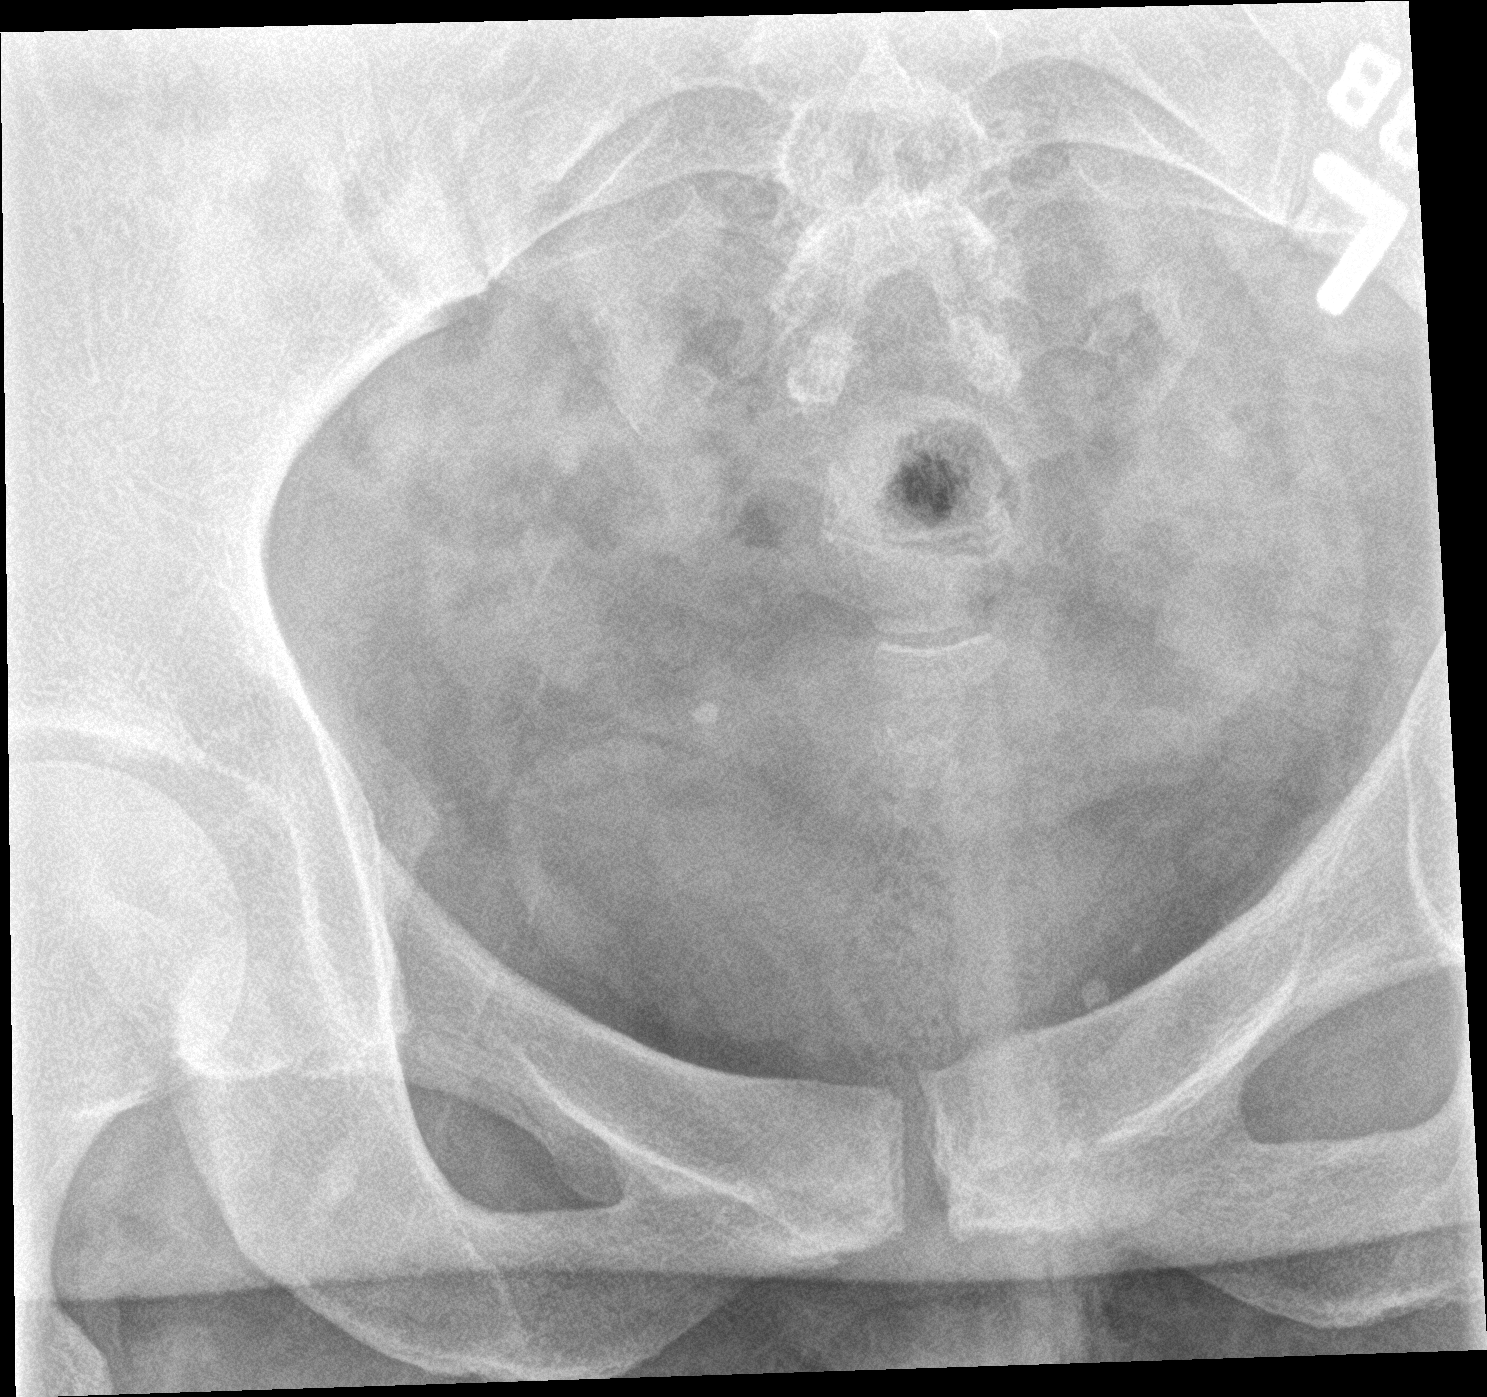

[sacrum ap]
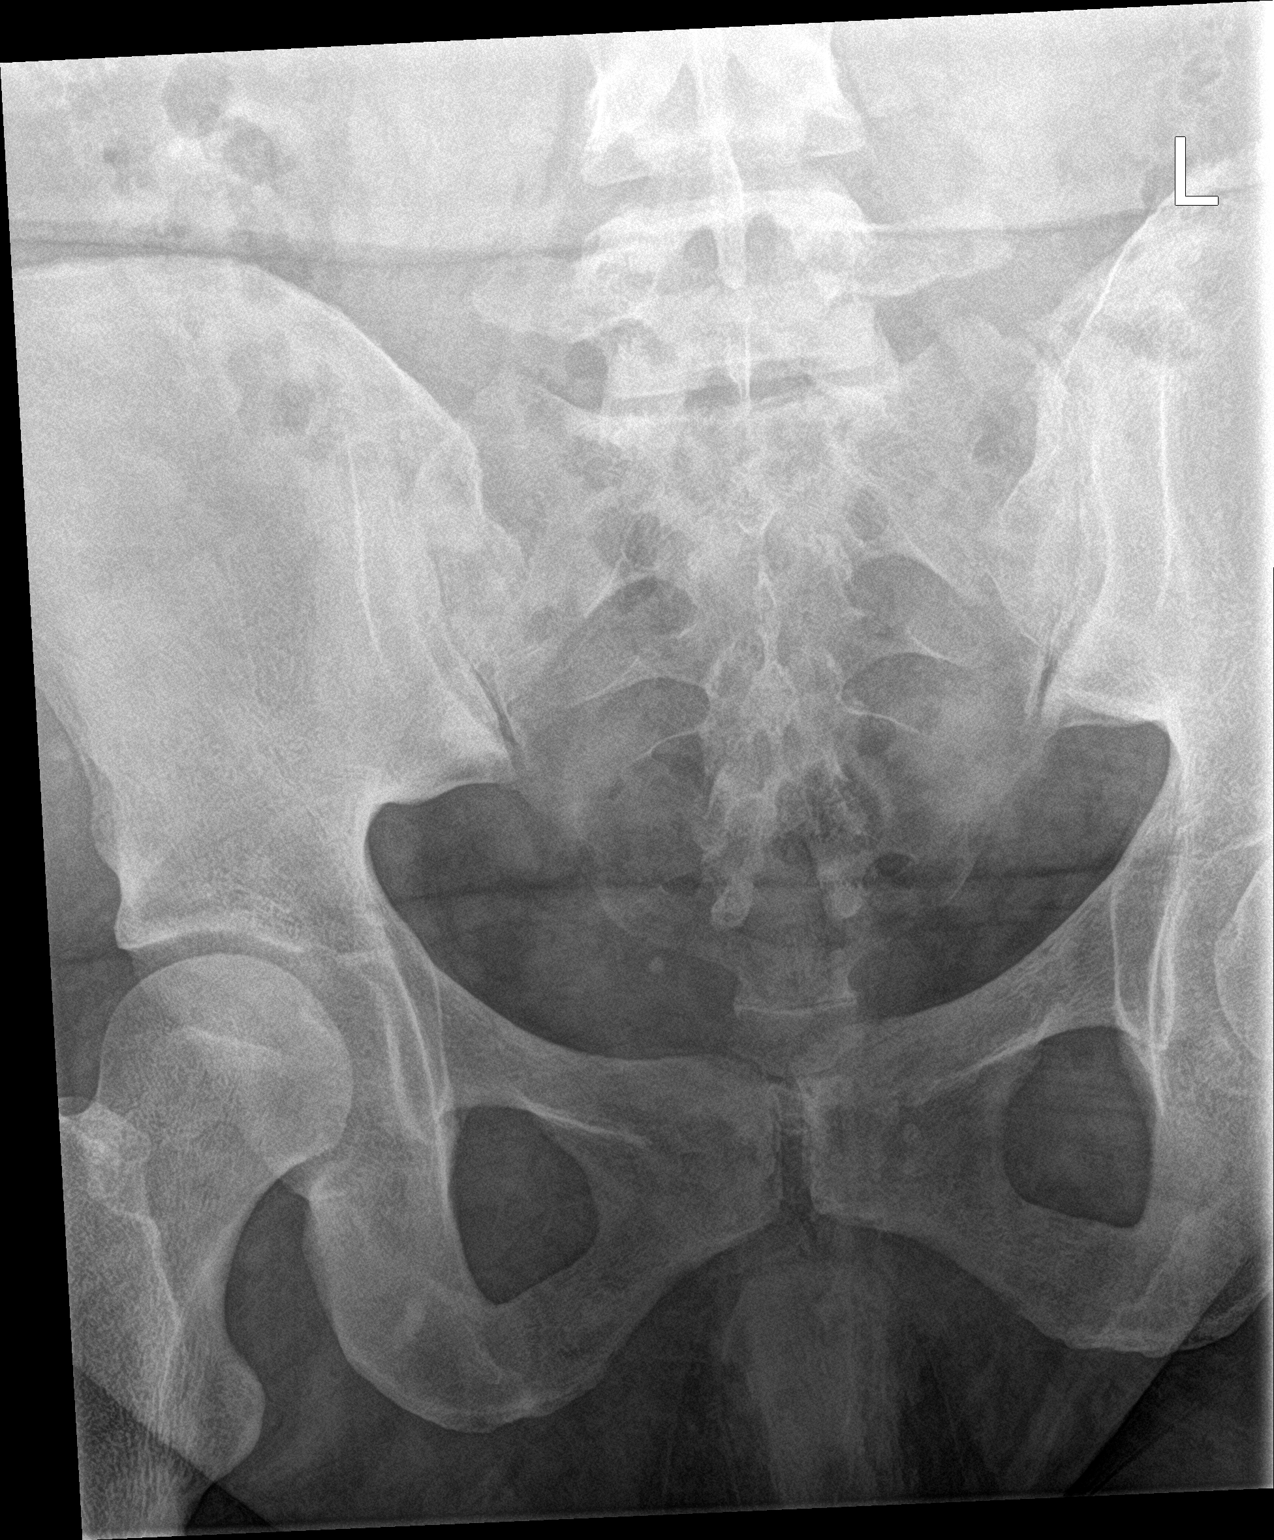

[sacrum lat]
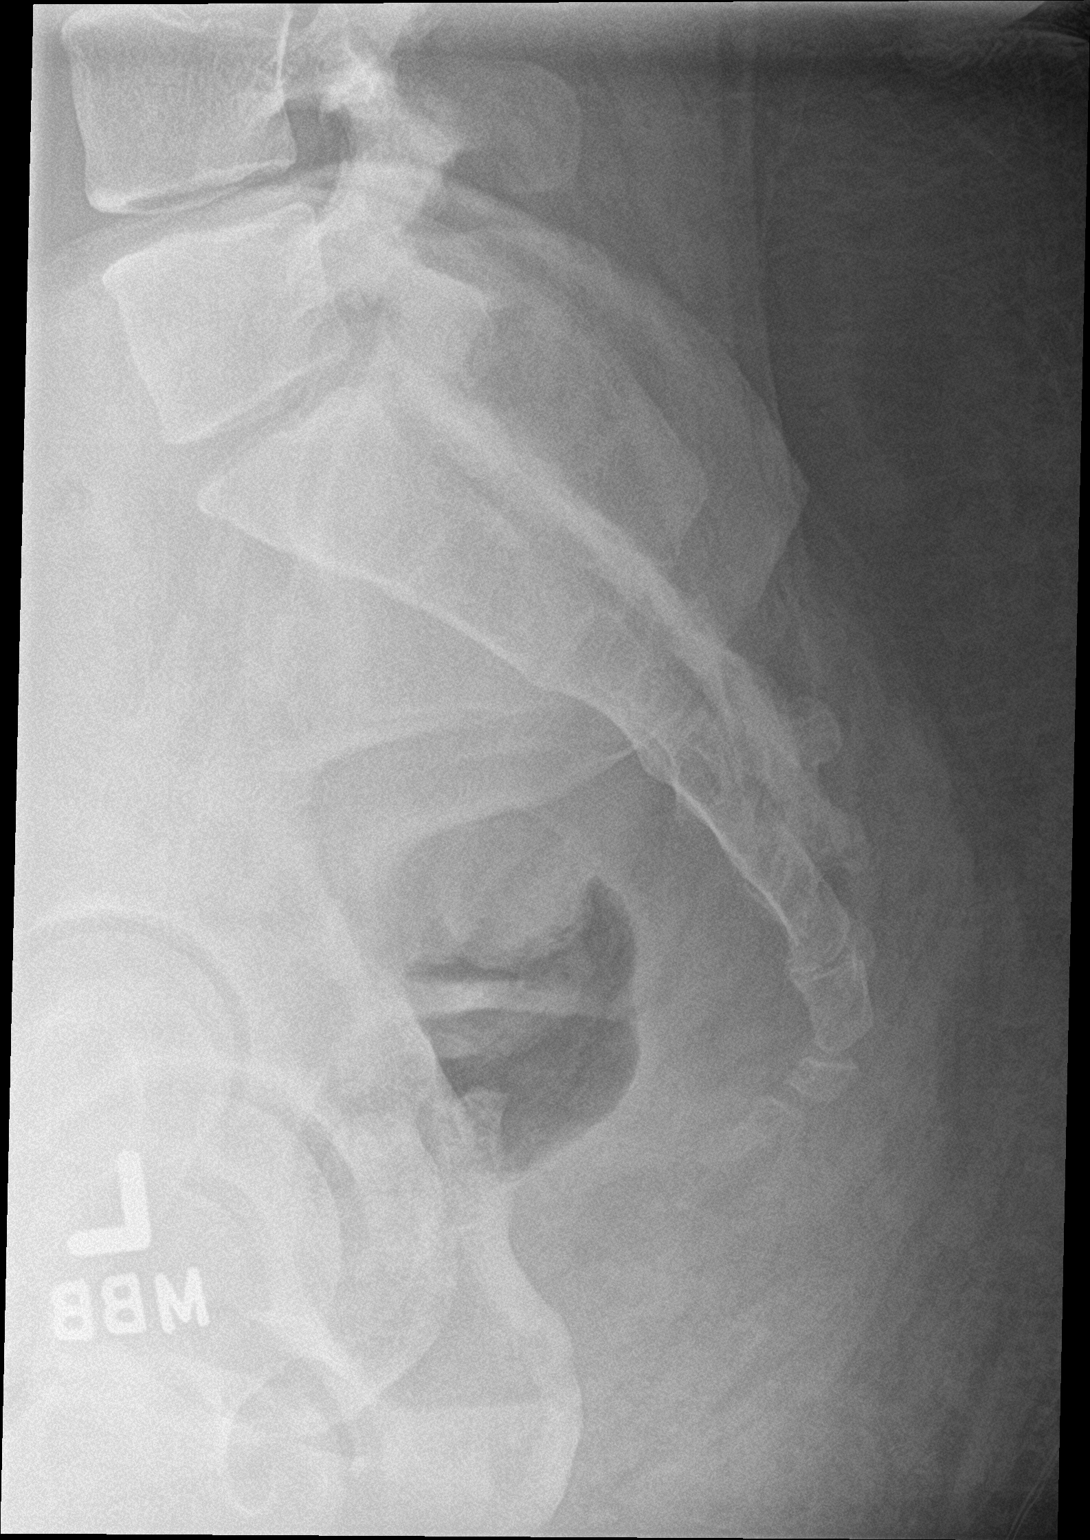

[3 of 3 positions shown; findings below may reference images not displayed]

FINDINGS: There is no evidence of fracture or other focal bone lesions.
IMPRESSION: Negative.

## 2018-02-28 IMAGING — DX DG LUMBAR SPINE COMPLETE 4+V
5 series · 5 of 5 positions shown · non-contrast
Comparison: 05/16/2015 CT

CLINICAL DATA: Acute low back pain following fall today. Initial
encounter.

EXAM:
LUMBAR SPINE - COMPLETE 4+ VIEW

[l-spine ap]
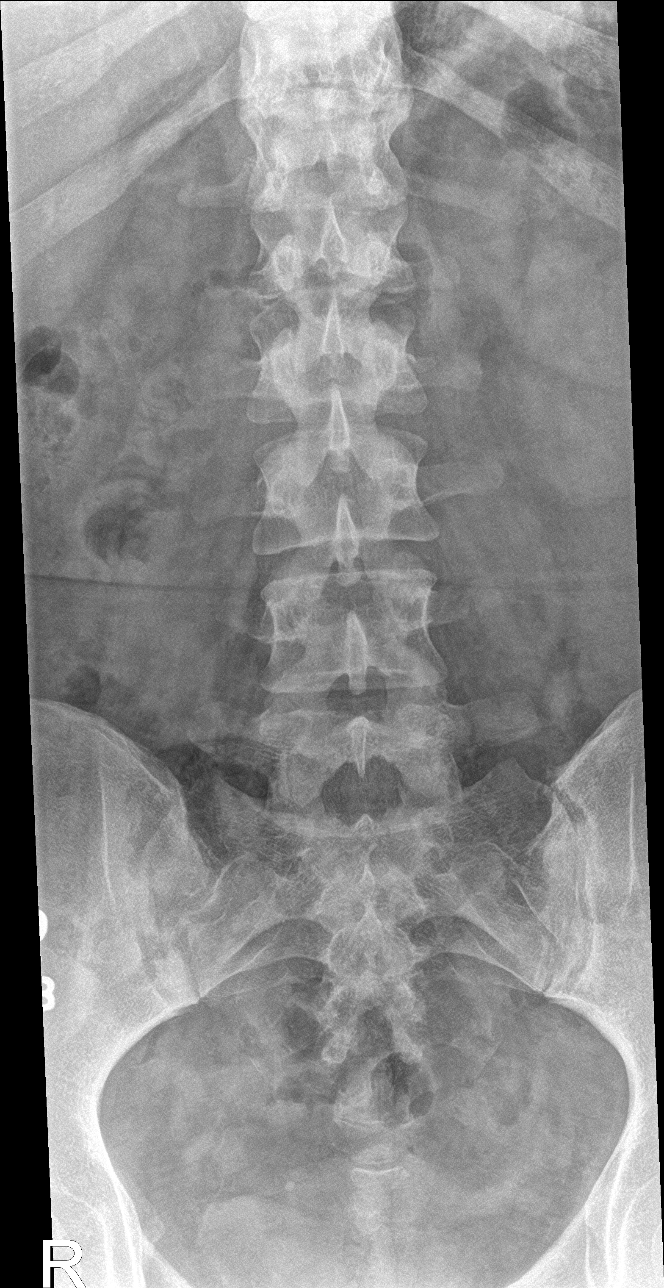

[l-spine obl (1 of 2)]
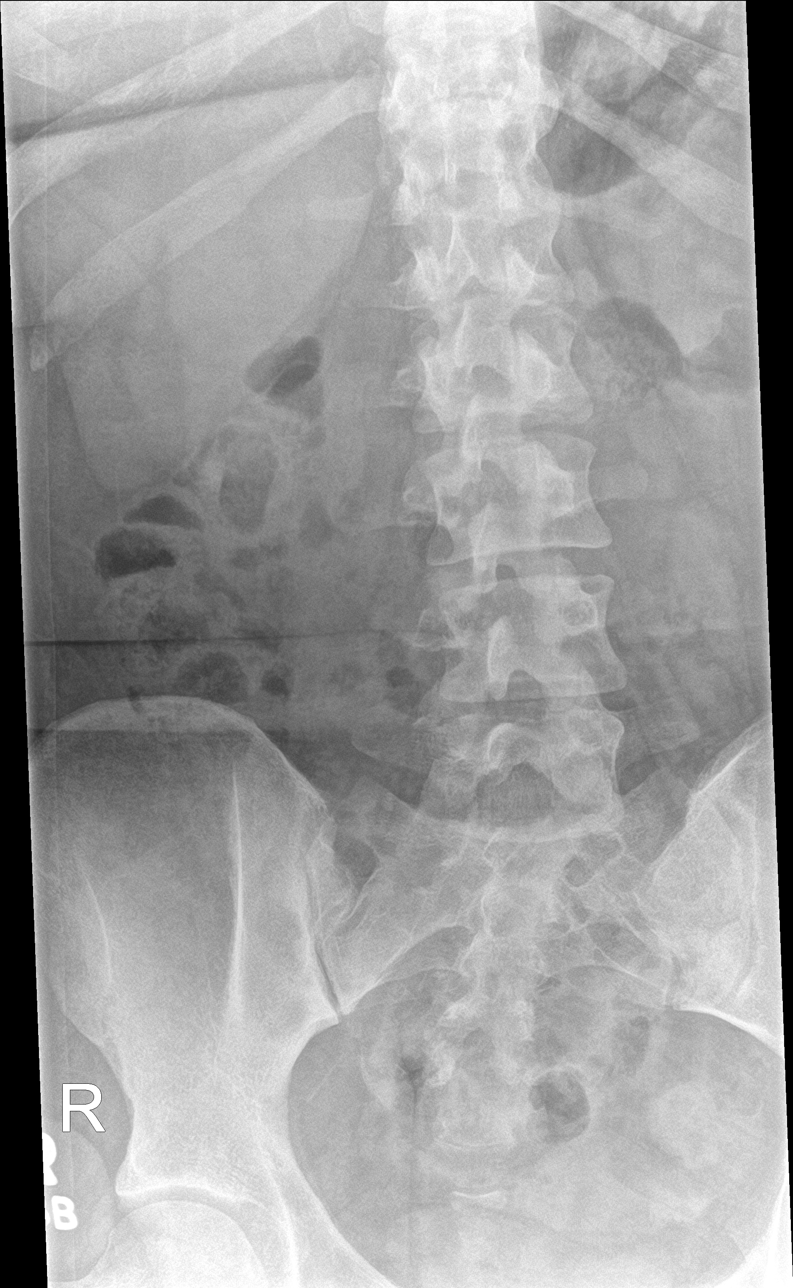

[l-spine obl (2 of 2)]
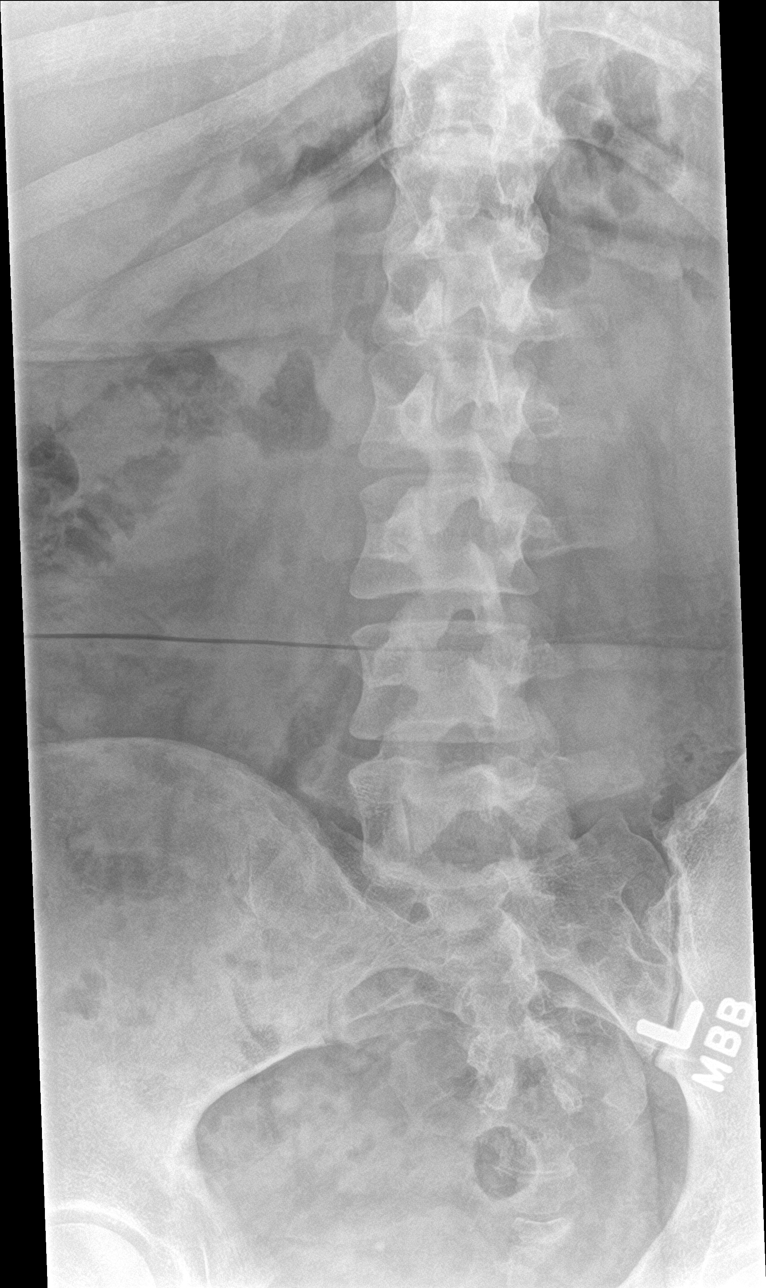

[l-spine lat]
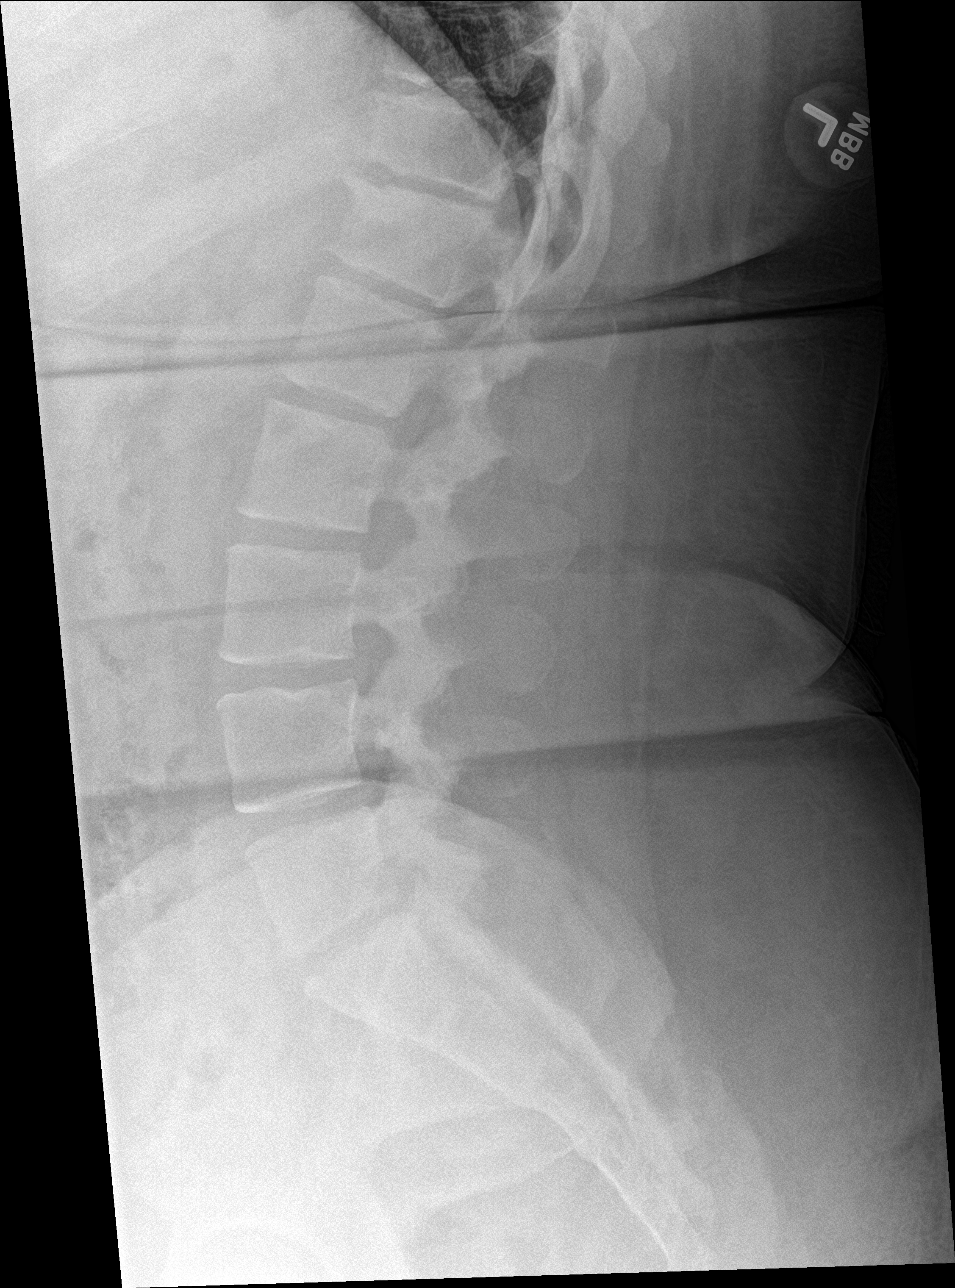

[l-spine spot]
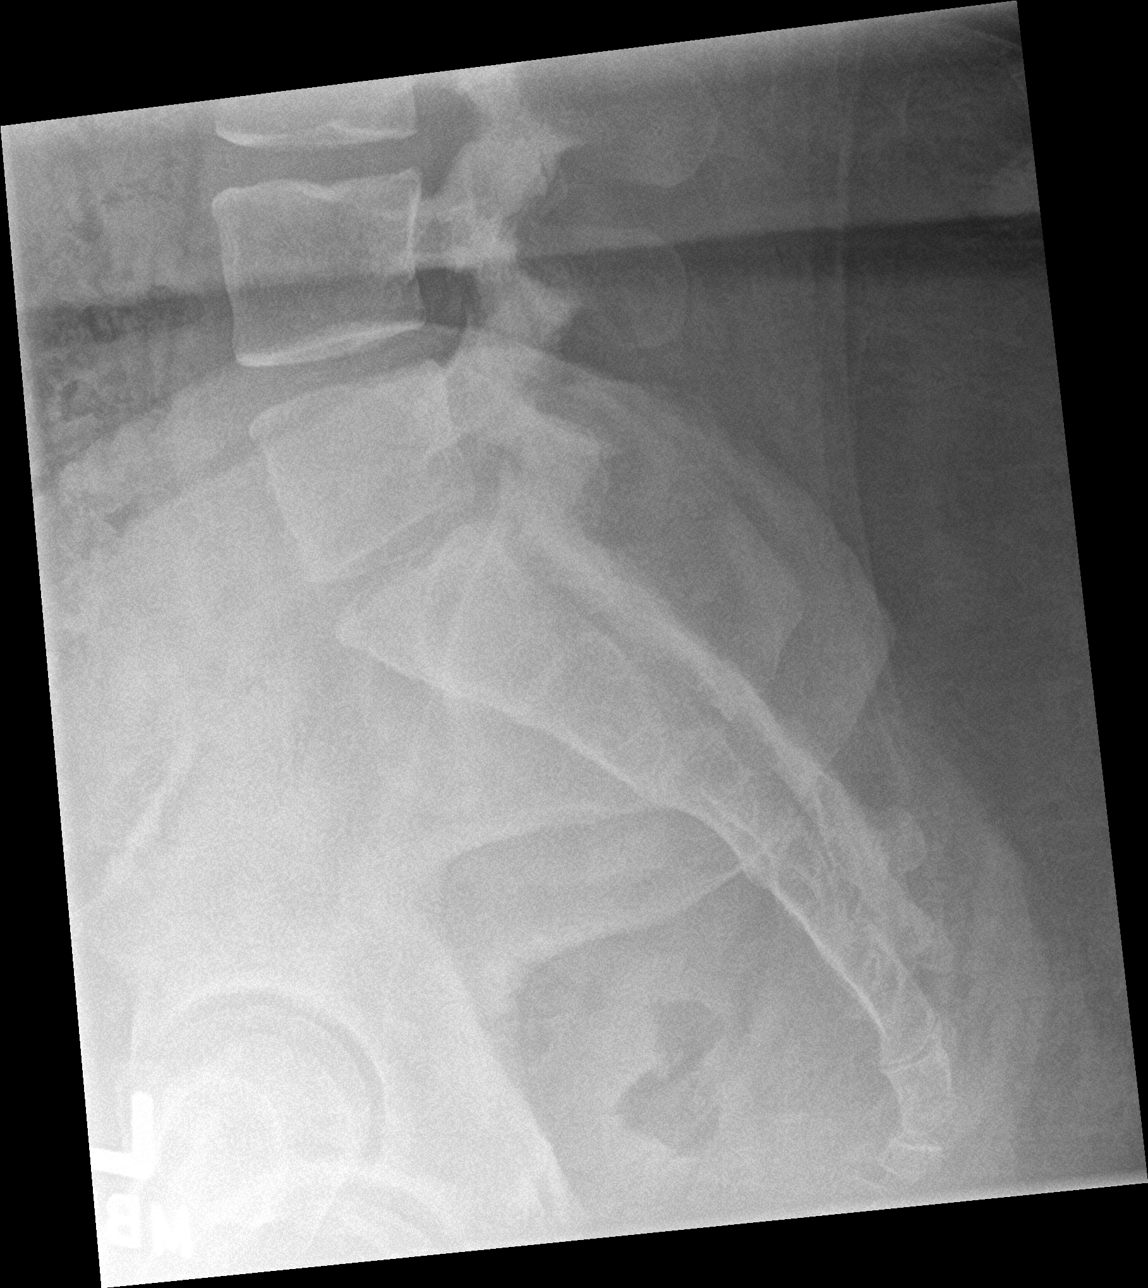

[5 of 5 positions shown; findings below may reference images not displayed]

FINDINGS: There is no evidence of fracture or subluxation.

Lumbar disc spaces are maintained.

Mild degenerative disc disease at T11-12 and T12-L1 again noted.

No focal bony lesions or spondylolysis noted.
IMPRESSION: No evidence of acute abnormality.

## 2018-07-01 DIAGNOSIS — E785 Hyperlipidemia, unspecified: Secondary | ICD-10-CM | POA: Diagnosis not present

## 2018-07-01 DIAGNOSIS — I10 Essential (primary) hypertension: Secondary | ICD-10-CM | POA: Diagnosis not present

## 2018-07-01 DIAGNOSIS — E1165 Type 2 diabetes mellitus with hyperglycemia: Secondary | ICD-10-CM | POA: Diagnosis not present

## 2018-08-04 ENCOUNTER — Encounter: Payer: 59 | Attending: Family Medicine | Admitting: *Deleted

## 2018-08-04 DIAGNOSIS — E118 Type 2 diabetes mellitus with unspecified complications: Secondary | ICD-10-CM | POA: Diagnosis present

## 2018-08-04 DIAGNOSIS — E1165 Type 2 diabetes mellitus with hyperglycemia: Secondary | ICD-10-CM | POA: Diagnosis not present

## 2018-08-04 DIAGNOSIS — IMO0002 Reserved for concepts with insufficient information to code with codable children: Secondary | ICD-10-CM

## 2018-08-04 NOTE — Patient Instructions (Signed)
Plan:  Aim for 3 Carb Choices per meal (45 grams) +/- 1 either way  Aim for 0-2 Carbs per snack if hungry  Include protein in moderation with your meals and snacks Continue with your activity level by walking at work daily as tolerated Consider checking BG once every day or so to monitor your successes with food choices and taking your insulin  Consider taking medication at 6 PM instead of 8 PM when you get ready to cook supper so you might take it more regularly

## 2018-08-11 NOTE — Progress Notes (Signed)
Diabetes Self-Management Education  Visit Type: First/Initial  Appt. Start Time: 1600 Appt. End Time: 1730  08/11/2018  [name], identified by name and date of birth, is a 28 y.o. female with a diagnosis of Diabetes: Type 2. Patient states she now has health insurance so she can afford her diabetes medications including Basaglar insulin, which she wasn't able to afford before. 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 She also works a 2nd job on the weekends at [employer], where she is also standing all day. She is taking on line college courses to major in Psychology as well.   ASSESSMENT  Weight (!) 313 lb (142 kg). Body mass index is 52.09 kg/m.  Diabetes Self-Management Education - 08/04/18 1604      Visit Information   Visit Type  First/Initial      Initial Visit   Diabetes Type  Type 2    Are you currently following a meal plan?  No    Are you taking your medications as prescribed?  No    Date Diagnosed  2008      Health Coping   How would you rate your overall health?  Fair      Psychosocial Assessment   Patient Belief/Attitude about Diabetes  Motivated to manage diabetes    Self-care barriers  Lack of material resources    Self-management support  Friends   lives boyfriend   Other persons present  Patient    Patient Concerns  Nutrition/Meal planning;Weight Control;Glycemic Control    Preferred Learning Style  No preference indicated    Learning Readiness  Contemplating    How often do you need to have someone help you when you read instructions, pamphlets, or other written materials from your doctor or pharmacy?  1 - Never    What is the last grade level you completed in school?  in college      Pre-Education Assessment   Patient understands the diabetes disease and treatment process.  Needs Review    Patient understands incorporating nutritional management into lifestyle.  Needs Review    Patient undertands incorporating physical  activity into lifestyle.  Needs Review    Patient understands using medications safely.  Needs Review    Patient understands monitoring blood glucose, interpreting and using results  Needs Review    Patient understands prevention, detection, and treatment of acute complications.  Needs Review    Patient understands prevention, detection, and treatment of chronic complications.  Needs Review    Patient understands how to develop strategies to address psychosocial issues.  Needs Review    Patient understands how to develop strategies to promote health/change behavior.  Needs Review      Complications   Last HgB A1C per patient/outside source  13.7 %    How often do you check your blood sugar?  0 times/day (not testing)    Have you had a dilated eye exam in the past 12 months?  No    Have you had a dental exam in the past 12 months?  No    Are you checking your feet?  Yes    How many days per week are you checking your feet?  7      Dietary Intake   Breakfast  biscuit from [employer] with hash brown OR nutrigrain bar OR PNB crackers    Snack (morning)  same as breakfast along with sugar free jello OR [place] wrap OR fruit cup OR fresh  fruit    Lunch  brings from home - left overs from dinner OR same as breakfast OR sandwich and small bag of chips     Snack (afternoon)  [employer], mozzarella sticks or double cheese burger    Dinner  she cooks evening meal- lean meat on sale and vegetable with starch, usually rice. No bread    Snack (evening)  cup of sugar free jello or fruit cup or left overs from dinner, or Jiffy popcorn    Beverage(s)  Coke zero, energy drink, diet green tea, occasionally water      Exercise   Exercise Type  Light (walking / raking leaves)   works in [employer], on her feet all day   How many days per week to you exercise?  5    How many minutes per day do you exercise?  30    Total minutes per week of exercise  150      Patient Education    Previous Diabetes Education  No    Disease state   Factors that contribute to the development of diabetes;Definition of diabetes, type 1 and 2, and the diagnosis of diabetes    Nutrition management   Role of diet in the treatment of diabetes and the relationship between the three main macronutrients and blood glucose level;Food label reading, portion sizes and measuring food.;Carbohydrate counting;Reviewed blood glucose goals for pre and post meals and how to evaluate the patients' food intake on their blood glucose level.    Physical activity and exercise   Role of exercise on diabetes management, blood pressure control and cardiac health.    Medications  Reviewed patients medication for diabetes, action, purpose, timing of dose and side effects.    Monitoring  Identified appropriate SMBG and/or A1C goals.    Acute complications  Taught treatment of hypoglycemia - the 15 rule.    Psychosocial adjustment  Role of stress on diabetes    Personal strategies to promote health  Helped patient develop diabetes management plan for (enter comment)      Individualized Goals (developed by patient)   Nutrition  Follow meal plan discussed    Physical Activity  Exercise 3-5 times per week    Medications  take my medication as prescribed    Monitoring   test blood glucose pre and post meals as discussed      Post-Education Assessment   Patient understands the diabetes disease and treatment process.  Demonstrates understanding / competency    Patient understands incorporating nutritional management into lifestyle.  Demonstrates understanding / competency    Patient undertands incorporating physical activity into lifestyle.  Demonstrates understanding / competency    Patient understands using medications safely.  Demonstrates understanding / competency    Patient understands monitoring blood glucose, interpreting and using results  Demonstrates understanding / competency    Patient understands prevention,  detection, and treatment of acute complications.  Demonstrates understanding / competency    Patient understands prevention, detection, and treatment of chronic complications.  Demonstrates understanding / competency    Patient understands how to develop strategies to address psychosocial issues.  Demonstrates understanding / competency    Patient understands how to develop strategies to promote health/change behavior.  Demonstrates understanding / competency      Outcomes   Expected Outcomes  Demonstrated interest in learning. Expect positive outcomes    Future DMSE  PRN    Program Status  Completed       Individualized Plan for Diabetes  Self-Management Training:   Learning Objective:  Patient will have a greater understanding of diabetes self-management. Patient education plan is to attend individual and/or group sessions per assessed needs and concerns.   Plan:   Patient Instructions  Plan:  Aim for 3 Carb Choices per meal (45 grams) +/- 1 either way  Aim for 0-2 Carbs per snack if hungry  Include protein in moderation with your meals and snacks Continue with your activity level by walking at work daily as tolerated Consider checking BG once every day or so to monitor your successes with food choices and taking your insulin  Consider taking medication at 6 PM instead of 8 PM when you get ready to cook supper so you might take it more regularly  Expected Outcomes:  Demonstrated interest in learning. Expect positive outcomes  Education material provided: A1C conversion sheet, Meal plan card and Carbohydrate counting sheet  If problems or questions, patient to contact team via:  Phone  Future DSME appointment: PRN

## 2018-12-24 ENCOUNTER — Other Ambulatory Visit: Payer: Self-pay

## 2018-12-24 DIAGNOSIS — J09X2 Influenza due to identified novel influenza A virus with other respiratory manifestations: Secondary | ICD-10-CM

## 2018-12-28 LAB — NOVEL CORONAVIRUS, NAA: SARS-CoV-2, NAA: NOT DETECTED

## 2020-06-08 ENCOUNTER — Other Ambulatory Visit: Payer: 59

## 2020-10-24 DIAGNOSIS — Z794 Long term (current) use of insulin: Secondary | ICD-10-CM | POA: Insufficient documentation

## 2020-10-24 DIAGNOSIS — E1159 Type 2 diabetes mellitus with other circulatory complications: Secondary | ICD-10-CM | POA: Insufficient documentation

## 2020-10-24 DIAGNOSIS — E1169 Type 2 diabetes mellitus with other specified complication: Secondary | ICD-10-CM | POA: Insufficient documentation

## 2020-10-24 DIAGNOSIS — A601 Herpesviral infection of perianal skin and rectum: Secondary | ICD-10-CM | POA: Insufficient documentation

## 2022-06-21 DIAGNOSIS — R87612 Low grade squamous intraepithelial lesion on cytologic smear of cervix (LGSIL): Secondary | ICD-10-CM | POA: Insufficient documentation

## 2023-02-17 ENCOUNTER — Other Ambulatory Visit: Payer: Self-pay

## 2023-02-17 MED ORDER — TRULICITY 1.5 MG/0.5ML ~~LOC~~ SOAJ
1.5000 mg | SUBCUTANEOUS | 2 refills | Status: DC
  Filled 2023-02-17: qty 2, 28d supply, fill #0
  Filled 2023-03-14: qty 2, 28d supply, fill #1

## 2023-03-19 ENCOUNTER — Other Ambulatory Visit: Payer: Self-pay

## 2023-03-20 ENCOUNTER — Other Ambulatory Visit: Payer: Self-pay

## 2023-04-17 ENCOUNTER — Other Ambulatory Visit: Payer: Self-pay

## 2023-04-17 MED ORDER — TRULICITY 3 MG/0.5ML ~~LOC~~ SOAJ
3.0000 mg | SUBCUTANEOUS | 2 refills | Status: DC
  Filled 2023-04-17: qty 2, 28d supply, fill #0

## 2023-04-18 ENCOUNTER — Other Ambulatory Visit: Payer: Self-pay

## 2023-04-29 ENCOUNTER — Other Ambulatory Visit: Payer: Self-pay

## 2023-09-06 ENCOUNTER — Encounter (HOSPITAL_COMMUNITY): Payer: Self-pay

## 2023-09-06 ENCOUNTER — Ambulatory Visit (HOSPITAL_COMMUNITY)
Admission: EM | Admit: 2023-09-06 | Discharge: 2023-09-06 | Disposition: A | Discharge status: Home / Self Care | Attending: Physician Assistant | Admitting: Physician Assistant

## 2023-09-06 DIAGNOSIS — T161XXA Foreign body in right ear, initial encounter: Secondary | ICD-10-CM | POA: Diagnosis not present

## 2023-09-06 NOTE — Discharge Instructions (Signed)
 You are seen today for concerns of a foreign body in your right ear.  We were able to irrigate the ear and extricate the foreign body without damage to your inner ear.  On recheck there does not appear to be any retained foreign bodies in your ear.  If you start to have any more pain, drainage from the ear or bleeding please return to urgent care or see your primary care provider for further evaluation. Please do not insert anything in your ear such as a Q-tip or cotton swab as these can break and become lodged in the ear.  Also placing small items in the ear can lead to potential puncture risk of the eardrum. You can use a mix of warm water and peroxide in the ear to help cleanse it.  I recommend allowing this to sit in the ear for about 30 seconds to 1 minute and then pouring it out.  You can wipe the area down with a gentle warm cloth for further cleansing.  If you feel like you are having a lot of earwax buildup you can also go to your PCP office and ask for an ear lavage.

## 2023-09-06 NOTE — ED Triage Notes (Signed)
 Left ear pain after patient stuck a cotton ball in her ear today.

## 2023-09-06 NOTE — ED Provider Notes (Signed)
 [facility]    CSN: [id number] Arrival date & time: 09/06/23  1732      History   Chief Complaint No chief complaint on file.   HPI Summer Campbell is a 33 y.o. female.   HPI  She reports she was cleaning her ear with a cotton swab and thinks there is a piece stuck in her ear She tried to get it out with tweezers and water but is still having sensation that there is something in there She reports some slight discomfort (1/10)    Past Medical History:  Diagnosis Date   Diabetes mellitus without complication (HCC)    Hypertension     There are no active problems to display for this patient.   Past Surgical History:  Procedure Laterality Date   ADENOIDECTOMY     TONSILLECTOMY      OB History   No obstetric history on file.      Home Medications    Prior to Admission medications   Medication Sig Start Date End Date Taking? Authorizing Provider  Diclofenac Sodium CR (VOLTAREN-XR) 100 MG 24 hr tablet Take 1 tablet (100 mg total) by mouth daily. 05/16/15  Yes Palumbo, April, MD  Dulaglutide (TRULICITY) 3 MG/0.5ML SOAJ Inject 3 mg into the skin once a week. 04/17/23  Yes   Insulin Glargine (BASAGLAR KWIKPEN) 100 UNIT/ML SOPN Inject 18 Units into the skin at bedtime.   Yes [provider]  lisinopril-hydrochlorothiazide (PRINZIDE,ZESTORETIC) 10-12.5 MG tablet Take 1 tablet by mouth daily.   Yes [provider]  metFORMIN (GLUCOPHAGE) 500 MG tablet Take 1,000 mg by mouth 2 (two) times daily with a meal.    Yes [provider]  naproxen (NAPROSYN) 500 MG tablet Take 1 tablet (500 mg total) by mouth 2 (two) times daily with a meal. 04/04/17  Yes Caccavale, Sophia, PA-C  NONFORMULARY OR COMPOUNDED ITEM [facility]: Scar Cream - Verapamil 10%, Pentoxifylline 5%, apply 1-2 grams to affected area 3-4 times daily. 07/19/16  Yes Vivi Barrack, DPM  ondansetron (ZOFRAN ODT) 8 MG disintegrating tablet 8mg  ODT q8 hours prn nausea  05/16/15  Yes Palumbo, April, MD  rosuvastatin (CRESTOR) 10 MG tablet Take 10 mg by mouth daily.   Yes [provider]  tamsulosin (FLOMAX) 0.4 MG CAPS capsule Take 1 capsule (0.4 mg total) by mouth daily. 05/16/15  Yes Palumbo, April, MD  cyclobenzaprine (FLEXERIL) 5 MG tablet Take 1 tablet (5 mg total) by mouth 2 (two) times daily as needed for muscle spasms. 04/04/17   Caccavale, Sophia, PA-C  Dulaglutide (TRULICITY) 1.5 MG/0.5ML SOPN Inject 1.5 mg into the skin once a week. 02/14/23     HYDROcodone-acetaminophen (NORCO/VICODIN) 5-325 MG per tablet Take 1 tablet by mouth every 6 (six) hours as needed for moderate pain.    [provider]  oxyCODONE-acetaminophen (PERCOCET) 5-325 MG tablet Take 1 tablet by mouth every 6 (six) hours as needed. Patient not taking: Reported on 07/18/2016 05/16/15   Palumbo, April, MD  tiZANidine (ZANAFLEX) 2 MG tablet Take by mouth every 6 (six) hours as needed for muscle spasms.    [provider]    Family History History reviewed. No pertinent family history.  Social History Social History   Tobacco Use   Smoking status: Never   Smokeless tobacco: Never  Substance Use Topics   Alcohol use: Yes    Comment: occassional   Drug use: No     Allergies   Patient has no known allergies.  Review of Systems Review of Systems  HENT:         Concern for foreign body in right ear      Physical Exam Triage Vital Signs ED Triage Vitals  Encounter Vitals Group     BP 09/06/23 1805 (!) 154/98     Systolic BP Percentile --      Diastolic BP Percentile --      Pulse Rate 09/06/23 1803 90     Resp 09/06/23 1803 16     Temp 09/06/23 1803 98.3 F (36.8 C)     Temp Source 09/06/23 1803 Oral     SpO2 09/06/23 1803 97 %     Weight --      Height --      Head Circumference --      Peak Flow --      Pain Score 09/06/23 1805 4     Pain Loc --      Pain Education --      Exclude from Growth Chart --    No data  found.  Updated Vital Signs BP (!) 154/98   Pulse 90   Temp 98.3 F (36.8 C) (Oral)   Resp 16   LMP 08/15/2023 (Approximate)   SpO2 97%   Visual Acuity Right Eye Distance:   Left Eye Distance:   Bilateral Distance:    Right Eye Near:   Left Eye Near:    Bilateral Near:     Physical Exam Vitals reviewed.  Constitutional:      General: She is awake.     Appearance: Normal appearance. She is well-developed and well-groomed.  HENT:     Head: Normocephalic and atraumatic.     Right Ear: Hearing and tympanic membrane normal. No tenderness. A foreign body is present.     Left Ear: Hearing, tympanic membrane and ear canal normal.     Ears:     Comments: Patient has a white, gauzy appearing foreign body in the right ear canal.  No signs of trauma, laceration, bleeding noted to the canal.  Tympanic membrane appears intact and normal Neurological:     Mental Status: She is alert.  Psychiatric:        Mood and Affect: Mood normal.        Behavior: Behavior normal. Behavior is cooperative.      UC Treatments / Results  Labs (all labs ordered are listed, but only abnormal results are displayed) Labs Reviewed - No data to display  EKG   Radiology No results found.  Procedures Procedures (including critical care time)  Medications Ordered in UC Medications - No data to display  Initial Impression / Assessment and Plan / UC Course  I have reviewed the triage vital signs and the nursing notes.  Pertinent labs & imaging results that were available during my care of the patient were reviewed by me and considered in my medical decision making (see chart for details).      Final Clinical Impressions(s) / UC Diagnoses   Final diagnoses:  Foreign body of right ear, initial encounter   Patient presents today with concerns for foreign body in the right ear canal.  She reports that she was trying to cleanse her ear with a cotton swab and part of it came off in her ear.  She  tried to irrigate the canal and use peroxide but these did not provide relief.  Physical exam is notable for a white, wet foreign body in the ear canal with  intact appearing tympanic membrane.  Ear lavage was performed and the foreign body was removed by nursing staff.  On repeat exam the right tympanic membrane appears intact and ear canal does not show signs of trauma, bleeding.  Reviewed home measures that are safe and effective for cleansing the ear canal that did not involve inserting objects into the canal.  Patient voiced agreement and understanding with recommendations.  ED and return precautions were reviewed and provided in after visit summary.  Follow-up as needed.    Discharge Instructions      You are seen today for concerns of a foreign body in your right ear.  We were able to irrigate the ear and extricate the foreign body without damage to your inner ear.  On recheck there does not appear to be any retained foreign bodies in your ear.  If you start to have any more pain, drainage from the ear or bleeding please return to urgent care or see your primary care provider for further evaluation. Please do not insert anything in your ear such as a Q-tip or cotton swab as these can break and become lodged in the ear.  Also placing small items in the ear can lead to potential puncture risk of the eardrum. You can use a mix of warm water and peroxide in the ear to help cleanse it.  I recommend allowing this to sit in the ear for about 30 seconds to 1 minute and then pouring it out.  You can wipe the area down with a gentle warm cloth for further cleansing.  If you feel like you are having a lot of earwax buildup you can also go to your PCP office and ask for an ear lavage.     ED Prescriptions   None    PDMP not reviewed this encounter.   Roselind Messier 09/06/23 1914

## 2023-10-26 ENCOUNTER — Inpatient Hospital Stay (HOSPITAL_COMMUNITY)
Admission: AD | Admit: 2023-10-26 | Discharge: 2023-10-26 | Disposition: A | Discharge status: Home / Self Care | Attending: Family Medicine | Admitting: Family Medicine

## 2023-10-26 ENCOUNTER — Inpatient Hospital Stay (HOSPITAL_COMMUNITY)

## 2023-10-26 ENCOUNTER — Encounter (HOSPITAL_COMMUNITY): Payer: Self-pay | Admitting: Obstetrics and Gynecology

## 2023-10-26 ENCOUNTER — Other Ambulatory Visit: Payer: Self-pay

## 2023-10-26 ENCOUNTER — Other Ambulatory Visit: Payer: Self-pay | Admitting: Advanced Practice Midwife

## 2023-10-26 DIAGNOSIS — Z794 Long term (current) use of insulin: Secondary | ICD-10-CM | POA: Diagnosis not present

## 2023-10-26 DIAGNOSIS — Z7984 Long term (current) use of oral hypoglycemic drugs: Secondary | ICD-10-CM | POA: Insufficient documentation

## 2023-10-26 DIAGNOSIS — Z3A08 8 weeks gestation of pregnancy: Secondary | ICD-10-CM

## 2023-10-26 DIAGNOSIS — R1030 Lower abdominal pain, unspecified: Secondary | ICD-10-CM | POA: Diagnosis present

## 2023-10-26 DIAGNOSIS — O24111 Pre-existing diabetes mellitus, type 2, in pregnancy, first trimester: Secondary | ICD-10-CM | POA: Diagnosis not present

## 2023-10-26 DIAGNOSIS — Z3491 Encounter for supervision of normal pregnancy, unspecified, first trimester: Secondary | ICD-10-CM

## 2023-10-26 DIAGNOSIS — Z113 Encounter for screening for infections with a predominantly sexual mode of transmission: Secondary | ICD-10-CM | POA: Diagnosis present

## 2023-10-26 LAB — URINALYSIS, ROUTINE W REFLEX MICROSCOPIC
Bilirubin Urine: NEGATIVE
Glucose, UA: >=500 mg/dL — AB
Hgb urine dipstick: NEGATIVE
Ketones, ur: NEGATIVE mg/dL
Leukocytes,Ua: NEGATIVE
Nitrite: NEGATIVE
Protein, ur: NEGATIVE mg/dL
Specific Gravity, Urine: 1.035 — ABNORMAL HIGH (ref 1.005–1.030)
pH: 5 (ref 5.0–8.0)

## 2023-10-26 LAB — CBC
HCT: 42.3 % (ref 36.0–46.0)
Hemoglobin: 13.9 g/dL (ref 12.0–15.0)
MCH: 28.4 pg (ref 26.0–34.0)
MCHC: 32.9 g/dL (ref 30.0–36.0)
MCV: 86.5 fL (ref 80.0–100.0)
Platelets: 266 10*3/uL (ref 150–400)
RBC: 4.89 MIL/uL (ref 3.87–5.11)
RDW: 12.8 % (ref 11.5–15.5)
WBC: 13.6 10*3/uL — ABNORMAL HIGH (ref 4.0–10.5)
nRBC: 0 % (ref 0.0–0.2)

## 2023-10-26 LAB — WET PREP, GENITAL
Sperm: NONE SEEN
Trich, Wet Prep: NONE SEEN
WBC, Wet Prep HPF POC: >=10 — AB (ref <10)
Yeast Wet Prep HPF POC: NONE SEEN

## 2023-10-26 LAB — HCG, QUANTITATIVE, PREGNANCY: hCG, Beta Chain, Quant, S: 45339 m[IU]/mL — ABNORMAL HIGH (ref <5)

## 2023-10-26 MED ORDER — INSULIN GLARGINE 100 UNIT/ML SOLOSTAR PEN: 30.0000 [IU] | PEN_INJECTOR | Freq: Every day | SUBCUTANEOUS | 5 refills | Status: DC

## 2023-10-26 MED ORDER — METFORMIN HCL 500 MG PO TABS: 1000.0000 mg | ORAL_TABLET | Freq: Two times a day (BID) | ORAL | 2 refills | Status: DC

## 2023-10-26 MED ORDER — NOVOLOG FLEXPEN 100 UNIT/ML ~~LOC~~ SOPN
20.0000 [IU] | PEN_INJECTOR | Freq: Three times a day (TID) | SUBCUTANEOUS | 2 refills | Status: AC
Start: 2023-10-26 — End: ?

## 2023-10-26 NOTE — MAU Provider Note (Signed)
 Chief Complaint: Abdominal Pain   None     SUBJECTIVE HPI: Summer Campbell is a 33 y.o. G1P0 at 4-5 weeks by LMP who presents to maternity admissions reporting lower abdominal pain x 4 days. Menses are irregular. She had one day of light bleeding May 6 but no normal menses x 4 months.  She had a negative pregnancy test 1 month ago at an [facility] facility.  She reports nausea if she does not eat regularly, but denies any n/v today.     Hx is significant for Type 2 DM, and she is taking Trulicity  and Metformin .  Her A1C was 14+ and is now down to 10.3.     HPI  Past Medical History:  Diagnosis Date   Diabetes mellitus without complication (HCC)    Hypertension    Past Surgical History:  Procedure Laterality Date   ADENOIDECTOMY     TONSILLECTOMY     Social History   Socioeconomic History   Marital status: Single    Spouse name: Not on file   Number of children: Not on file   Years of education: Not on file   Highest education level: Not on file  Occupational History   Not on file  Tobacco Use   Smoking status: Never   Smokeless tobacco: Never  Substance and Sexual Activity   Alcohol use: Yes    Comment: occassional   Drug use: No   Sexual activity: Not on file  Other Topics Concern   Not on file  Social History Narrative   Not on file   Social Drivers of Health   Financial Resource Strain: Medium Risk (10/14/2023)   Received from [facility]   Overall Financial Resource Strain (CARDIA)    Difficulty of Paying Living Expenses: Somewhat hard  Food Insecurity: Food Insecurity Present (10/14/2023)   Received from [facility]   Hunger Vital Sign    Worried About Running Out of Food in the Last Year: Sometimes true    Ran Out of Food in the Last Year: Sometimes true  Transportation Needs: No Transportation Needs (10/14/2023)   Received from [facility] - Transportation    Lack of Transportation (Medical): No    Lack of Transportation (Non-Medical): No   Physical Activity: Sufficiently Active (10/14/2023)   Received from [facility]   Exercise Vital Sign    Days of Exercise per Week: 6 days    Minutes of Exercise per Session: 150+ min  Stress: Stress Concern Present (10/14/2023)   Received from [facility] - Occupational Stress Questionnaire    Feeling of Stress : Rather much  Social Connections: Socially Isolated (10/14/2023)   Received from [facility]   Social Network    How would you rate your social network (family, work, friends)?: Little participation, lonely and socially isolated  Intimate Partner Violence: Not At Risk (10/14/2023)   Received from [facility]   HITS    Over the last 12 months how often did your partner physically hurt you?: Never    Over the last 12 months how often did your partner insult you or talk down to you?: Never    Over the last 12 months how often did your partner threaten you with physical harm?: Never    Over the last 12 months how often did your partner scream or curse at you?: Never   No current facility-administered medications on file prior to encounter.   Current Outpatient Medications  on File Prior to Encounter  Medication Sig Dispense Refill   ondansetron  (ZOFRAN  ODT) 8 MG disintegrating tablet 8mg  ODT q8 hours prn nausea 10 tablet 0   No Known Allergies  ROS:  Review of Systems  Constitutional:  Negative for chills, fatigue and fever.  Respiratory:  Negative for shortness of breath.   Cardiovascular:  Negative for chest pain.  Gastrointestinal:  Positive for abdominal pain.  Genitourinary:  Negative for difficulty urinating, dysuria, flank pain, pelvic pain, vaginal bleeding, vaginal discharge and vaginal pain.  Neurological:  Negative for dizziness and headaches.  Psychiatric/Behavioral: Negative.       I have reviewed patient's Past Medical Hx, Surgical Hx, Family Hx, Social Hx, medications and allergies.   Physical Exam   Patient Vitals for the past 24 hrs:  BP Temp Pulse Resp SpO2 Weight  10/26/23 1458 139/74 98.3 F (36.8 C) 78 16 100 % --  10/26/23 1454 -- -- -- -- -- 129.3 kg   Constitutional: Well-developed, well-nourished female in no acute distress.  Cardiovascular: normal rate Respiratory: normal effort GI: Abd soft, non-tender. Pos BS x 4 MS: Extremities nontender, no edema, normal ROM Neurologic: Alert and oriented x 4.  GU: Neg CVAT.  PELVIC EXAM: Deferred  LAB RESULTS Results for orders placed or performed during the hospital encounter of 10/26/23 (from the past 24 hours)  Urinalysis, Routine w reflex microscopic -Urine, Clean Catch     Status: Abnormal   Collection Time: 10/26/23  3:19 PM  Result Value Ref Range   Color, Urine YELLOW YELLOW   APPearance CLEAR CLEAR   Specific Gravity, Urine 1.035 (H) 1.005 - 1.030   pH 5.0 5.0 - 8.0   Glucose, UA >=500 (A) NEGATIVE mg/dL   Hgb urine dipstick NEGATIVE NEGATIVE   Bilirubin Urine NEGATIVE NEGATIVE   Ketones, ur NEGATIVE NEGATIVE mg/dL   Protein, ur NEGATIVE NEGATIVE mg/dL   Nitrite NEGATIVE NEGATIVE   Leukocytes,Ua NEGATIVE NEGATIVE   RBC / HPF 0-5 0 - 5 RBC/hpf   WBC, UA 0-5 0 - 5 WBC/hpf   Bacteria, UA RARE (A) NONE SEEN   Squamous Epithelial / HPF 0-5 0 - 5 /HPF  CBC     Status: Abnormal   Collection Time: 10/26/23  3:31 PM  Result Value Ref Range   WBC 13.6 (H) 4.0 - 10.5 K/uL   RBC 4.89 3.87 - 5.11 MIL/uL   Hemoglobin 13.9 12.0 - 15.0 g/dL   HCT 16.1 09.6 - 04.5 %   MCV 86.5 80.0 - 100.0 fL   MCH 28.4 26.0 - 34.0 pg   MCHC 32.9 30.0 - 36.0 g/dL   RDW 40.9 81.1 - 91.4 %   Platelets 266 150 - 400 K/uL   nRBC 0.0 0.0 - 0.2 %  hCG, quantitative, pregnancy     Status: Abnormal   Collection Time: 10/26/23  3:31 PM  Result Value Ref Range   hCG, Beta Chain, Quant, S 45,339 (H) <5 mIU/mL  ABO/Rh     Status: None   Collection Time: 10/26/23  3:31 PM  Result Value Ref Range   ABO/RH(D)      A POS Performed at [facility], [address], [place], [place] [address]   Wet prep, genital     Status: Abnormal   Collection Time: 10/26/23  4:01 PM  Result Value Ref Range   Yeast Wet Prep HPF POC NONE SEEN NONE SEEN   Trich, Wet Prep NONE SEEN NONE SEEN   Clue Cells  Wet Prep HPF POC PRESENT (A) NONE SEEN   WBC, Wet Prep HPF POC >=10 (A) <10   Sperm NONE SEEN     --/--/A POS Performed at [facility], [address], [place], [place] [address]  [phone])  IMAGING US  OB LESS THAN 14 WEEKS WITH OB TRANSVAGINAL Result Date: 10/26/2023 CLINICAL DATA:  Pain for several days. EXAM: OBSTETRIC <14 WK US  AND TRANSVAGINAL OB US  TECHNIQUE: Both transabdominal and transvaginal ultrasound examinations were performed for complete evaluation of the gestation as well as the maternal uterus, adnexal regions, and pelvic cul-de-sac. Transvaginal technique was performed to assess early pregnancy. COMPARISON:  None Available. FINDINGS: Intrauterine gestational sac: Single Yolk sac:  Yes Embryo:  Yes Cardiac Activity: Yes Heart Rate: 179 bpm CRL:  18.9 mm   8 w   3 d                  US  EDC: 06/03/2024. Subchorionic hemorrhage:  None visualized. Maternal uterus/adnexae: Corpus luteal cyst is noted on the left. The ovaries are otherwise within normal limits. No free fluid is seen in the pelvis. Examination is limited due to patient's body habitus. IMPRESSION: A single live intrauterine pregnancy with estimated gestational age of [redacted] weeks 3 days. EDC: 06/03/2024. Electronically Signed   By: Wyvonnia Heimlich M.D.   On: 10/26/2023 18:14    MAU Management/MDM: Orders Placed This Encounter  Procedures   Wet prep, genital   US  OB LESS THAN 14 WEEKS WITH OB TRANSVAGINAL   Urinalysis, Routine w reflex microscopic -Urine, Clean Catch   CBC   hCG, quantitative, pregnancy   ABO/Rh   Discharge patient Discharge disposition: 01-Home or Self Care; Discharge patient date: 10/26/2023    Meds ordered this encounter  Medications    insulin glargine (LANTUS) 100 UNIT/ML Solostar Pen    Sig: Inject 30 Units into the skin at bedtime.    Dispense:  15 mL    Refill:  5    Supervising Provider:   PRATT, TANYA S [[id number]]   insulin aspart (NOVOLOG FLEXPEN) 100 UNIT/ML FlexPen    Sig: Inject 20 Units into the skin 3 (three) times daily with meals.    Dispense:  30 mL    Refill:  2    Supervising Provider:   PRATT, TANYA S [[id number]]   metFORMIN (GLUCOPHAGE) 500 MG tablet    Sig: Take 2 tablets (1,000 mg total) by mouth 2 (two) times daily with a meal.    Dispense:  120 tablet    Refill:  2    Supervising Provider:   PRATT, TANYA S [[id number]]    IUP on today's US  dating pregnancy to [redacted]w[redacted]d.  Reviewed pt medications and discontinued Trulicity .  Consult Dr Adriana Hopping. Initiated insulin at weight based dosing, Lantus 30 U at bedtime and Novolog 20 U with meals.  Pt has meter at home and will start taking glucose values.  Logs provided in MAU today.  F/U with PCP as scheduled, and [facility]     ASSESSMENT 1. Pregnancy with type 2 diabetes mellitus in first trimester   2. [redacted] weeks gestation of pregnancy   3. Normal IUP (intrauterine pregnancy) on prenatal ultrasound, first trimester     PLAN Discharge home Allergies as of 10/26/2023   No Known Allergies      Medication List     STOP taking these medications    cyclobenzaprine  5 MG tablet Commonly known as: FLEXERIL   Diclofenac  Sodium CR 100 MG 24 hr tablet Commonly known as: Voltaren -XR   HYDROcodone-acetaminophen  5-325 MG tablet Commonly known as: NORCO/VICODIN   lisinopril-hydrochlorothiazide 10-12.5 MG tablet Commonly known as: ZESTORETIC   naproxen  500 MG tablet Commonly known as: NAPROSYN    NONFORMULARY OR COMPOUNDED ITEM   oxyCODONE -acetaminophen  5-325 MG tablet Commonly known as: Percocet   rosuvastatin 10 MG tablet Commonly known as: CRESTOR   tamsulosin  0.4 MG Caps capsule Commonly known as: Flomax    tiZANidine  2 MG tablet Commonly known as: ZANAFLEX   Trulicity  1.5 MG/0.5ML Soaj Generic drug: Dulaglutide    Trulicity  3 MG/0.5ML Soaj Generic drug: Dulaglutide        TAKE these medications    insulin glargine 100 UNIT/ML Solostar Pen Commonly known as: LANTUS Inject 30 Units into the skin at bedtime. What changed: how much to take   metFORMIN 500 MG tablet Commonly known as: GLUCOPHAGE Take 2 tablets (1,000 mg total) by mouth 2 (two) times daily with a meal.   NovoLOG FlexPen 100 UNIT/ML FlexPen Generic drug: insulin aspart Inject 20 Units into the skin 3 (three) times daily with meals.   ondansetron  8 MG disintegrating tablet Commonly known as: Zofran  ODT 8mg  ODT q8 hours prn nausea        Follow-up Information     [place] Follow up.   Why: As scheduled Contact information: [address] [place] [address] [phone]         [facility] Follow up.   Specialty: Obstetrics and Gynecology Why: As needed for emergencies Contact information: [address] [place]  [phone]                Arlester Bence Certified Nurse-Midwife 10/26/2023  8:41 PM

## 2023-10-26 NOTE — MAU Note (Signed)
.  Summer Campbell is a 33 y.o. at Unknown here in MAU reporting: Patient reports pain started on Wednesday and it comes and goes.  Last had 1 day of bleeding on may 6 has irregular periods and hasn't had a true period in 4 months.  Onset of complaint: last Wednesday  Pain score: 3/10 There were no vitals filed for this visit.    Lab orders placed from triage:   ua

## 2023-10-27 LAB — ABO/RH: ABO/RH(D): A POS

## 2023-10-28 LAB — GC/CHLAMYDIA PROBE AMP (~~LOC~~) NOT AT ARMC
Chlamydia: NEGATIVE
Comment: NEGATIVE
Comment: NORMAL
Neisseria Gonorrhea: NEGATIVE

## 2023-12-06 ENCOUNTER — Other Ambulatory Visit: Payer: Self-pay

## 2023-12-06 ENCOUNTER — Inpatient Hospital Stay (HOSPITAL_COMMUNITY)
Admission: AD | Admit: 2023-12-06 | Discharge: 2023-12-06 | Disposition: A | Discharge status: Home / Self Care | Attending: Obstetrics & Gynecology | Admitting: Obstetrics & Gynecology

## 2023-12-06 DIAGNOSIS — Z79899 Other long term (current) drug therapy: Secondary | ICD-10-CM | POA: Diagnosis not present

## 2023-12-06 DIAGNOSIS — O24112 Pre-existing diabetes mellitus, type 2, in pregnancy, second trimester: Secondary | ICD-10-CM | POA: Diagnosis not present

## 2023-12-06 DIAGNOSIS — Z3A14 14 weeks gestation of pregnancy: Secondary | ICD-10-CM | POA: Diagnosis not present

## 2023-12-06 DIAGNOSIS — Z6841 Body Mass Index (BMI) 40.0 and over, adult: Secondary | ICD-10-CM

## 2023-12-06 DIAGNOSIS — O99212 Obesity complicating pregnancy, second trimester: Secondary | ICD-10-CM | POA: Insufficient documentation

## 2023-12-06 DIAGNOSIS — Z794 Long term (current) use of insulin: Secondary | ICD-10-CM | POA: Insufficient documentation

## 2023-12-06 DIAGNOSIS — O10012 Pre-existing essential hypertension complicating pregnancy, second trimester: Secondary | ICD-10-CM

## 2023-12-06 LAB — URINALYSIS, ROUTINE W REFLEX MICROSCOPIC
Bilirubin Urine: NEGATIVE
Glucose, UA: >=500 mg/dL — AB
Hgb urine dipstick: NEGATIVE
Ketones, ur: NEGATIVE mg/dL
Leukocytes,Ua: NEGATIVE
Nitrite: NEGATIVE
Protein, ur: 30 mg/dL — AB
Specific Gravity, Urine: 1.027 (ref 1.005–1.030)
pH: 5 (ref 5.0–8.0)

## 2023-12-06 MED ORDER — NIFEDIPINE ER OSMOTIC RELEASE 30 MG PO TB24: 30.0000 mg | ORAL_TABLET | Freq: Every day | ORAL | 0 refills | Status: DC

## 2023-12-06 NOTE — MAU Note (Signed)
 Summer Campbell is a 33 y.o. at [redacted]w[redacted]d here in MAU reporting: [facility] - tried to discuss hypertension with them and they disregarded her. States she wanted to get a new OB and seek care here concerning her blood pressures and start medications. States blood pressures having been elevated for the past week. Reports lower back pain that has been ongoing, but new onset of lower abdominal pain. Denies VB or abnormal discharge.    LMP: NA Onset of complaint: on going Pain score: 5 - back; 5 - abdomen  Vitals:   12/06/23 1946  BP: (!) 168/94  Pulse: 90  Resp: 18  Temp: 99.2 F (37.3 C)  SpO2: 100%     FHT: unable to hear with doppler   Lab orders placed from triage: UA

## 2023-12-06 NOTE — MAU Provider Note (Signed)
 S [name] is a 32 y.o. G1P0 patient who presents to MAU today with complaint of concern for her chronic hypertension. She reports that she had went to [facility] but was unhappy since they did not address her cHTN and she has no medications. She denies any VB, LOF, or cramping at this time.  Patient's pregnancy is complicated by Morbid obesity , T 2 DM on insulin  without blood sugar logs ( Desires Dexcom if possible)    O BP (!) 168/94 (BP Location: Right Arm)   Pulse 90   Temp 99.2 F (37.3 C) (Oral)   Resp 18   Ht 5' 4 (1.626 m)   Wt (!) 136.2 kg   LMP  (LMP Unknown)   SpO2 100%   BMI 51.53 kg/m  Physical Exam Vitals and nursing note reviewed.  Constitutional:      General: She is not in acute distress.    Appearance: Normal appearance. She is obese. She is not ill-appearing.  HENT:     Head: Normocephalic.     Nose: Nose normal.     Mouth/Throat:     Mouth: Mucous membranes are moist.  Cardiovascular:     Rate and Rhythm: Normal rate.  Pulmonary:     Effort: Pulmonary effort is normal.  Abdominal:     Palpations: Abdomen is soft.  Musculoskeletal:        General: Normal range of motion.     Cervical back: Normal range of motion.  Skin:    General: Skin is warm.  Neurological:     Mental Status: She is alert and oriented to person, place, and time.  Psychiatric:        Mood and Affect: Mood normal.        Behavior: Behavior normal.     MDM  MODERATE  Pt informed that the ultrasound is considered a limited OB ultrasound and is not intended to be a complete ultrasound exam.  Patient also informed that the ultrasound is not being completed with the intent of assessing for fetal or placental anomalies or any pelvic abnormalities.  Explained that the purpose of today's ultrasound is to assess for  FHR due to increased maternal habitus .  Patient acknowledges the purpose of the exam and the limitations of the study.   FHR via ultrasound  doppler was 146 bpm   - RX sent to pharmacy to start patient on Procardia  30 XL daily and f/u BP check advised for 7/7 or 7/8 ( message sent to the office for appointment) - T2 DM in pregnancy on insulin  ( Message sent to Summer Emms NP for additional support and management)   Orders Placed This Encounter  Procedures   Urinalysis, Routine w reflex microscopic -Urine, Clean Catch    Standing Status:   Standing    Number of Occurrences:   1    Specimen Source:   Urine, Clean Catch [76]   Discharge patient Discharge disposition: 01-Home or Self Care; Discharge patient date: 12/06/2023    Standing Status:   Standing    Number of Occurrences:   1    Discharge disposition:   01-Home or Self Care [1]    Discharge patient date:   12/06/2023      Results for orders placed or performed during the hospital encounter of 12/06/23 (from the past 24 hours)  Urinalysis, Routine w reflex microscopic -Urine, Clean Catch     Status: Abnormal   Collection Time: 12/06/23  7:54  PM  Result Value Ref Range   Color, Urine YELLOW YELLOW   APPearance HAZY (A) CLEAR   Specific Gravity, Urine 1.027 1.005 - 1.030   pH 5.0 5.0 - 8.0   Glucose, UA >=500 (A) NEGATIVE mg/dL   Hgb urine dipstick NEGATIVE NEGATIVE   Bilirubin Urine NEGATIVE NEGATIVE   Ketones, ur NEGATIVE NEGATIVE mg/dL   Protein, ur 30 (A) NEGATIVE mg/dL   Nitrite NEGATIVE NEGATIVE   Leukocytes,Ua NEGATIVE NEGATIVE   RBC / HPF 0-5 0 - 5 RBC/hpf   WBC, UA 0-5 0 - 5 WBC/hpf   Bacteria, UA RARE (A) NONE SEEN   Squamous Epithelial / HPF 0-5 0 - 5 /HPF   Mucus PRESENT        I have reviewed the patient chart and performed the physical exam . Medications ordered as stated below.  A/P as described below.  Counseling and education provided and patient agreeable  with plan as described below. Verbalized understanding.    ASSESSMENT Medical screening exam complete 1. Essential hypertension affecting pregnancy in second trimester (Primary)  2.  BMI 50.0-59.9, adult (HCC)  3. [redacted] weeks gestation of pregnancy     PLAN Future Appointments  Date Time Provider Department Center  12/18/2023  1:15 PM [facility]NEW OB INTAKE [facility]  01/01/2024  9:15 AM Anyanwu, Gloris LABOR, MD [facility]    Discharge from MAU in stable condition  See AVS for full description of educational information and instructions provided to the patient at time of discharge  Warning signs for worsening condition that would warrant emergency follow-up discussed Patient may return to MAU as needed   Littie Olam LABOR, NP 12/06/2023 8:47 PM

## 2023-12-10 ENCOUNTER — Ambulatory Visit

## 2023-12-10 ENCOUNTER — Other Ambulatory Visit: Payer: Self-pay

## 2023-12-10 VITALS — BP 138/84 | HR 86 | Ht 64.0 in | Wt 295.8 lb

## 2023-12-10 DIAGNOSIS — Z3A15 15 weeks gestation of pregnancy: Secondary | ICD-10-CM

## 2023-12-10 DIAGNOSIS — O0990 Supervision of high risk pregnancy, unspecified, unspecified trimester: Secondary | ICD-10-CM

## 2023-12-10 DIAGNOSIS — O24112 Pre-existing diabetes mellitus, type 2, in pregnancy, second trimester: Secondary | ICD-10-CM | POA: Diagnosis not present

## 2023-12-10 DIAGNOSIS — Z0130 Encounter for examination of blood pressure without abnormal findings: Secondary | ICD-10-CM

## 2023-12-10 DIAGNOSIS — O10012 Pre-existing essential hypertension complicating pregnancy, second trimester: Secondary | ICD-10-CM

## 2023-12-10 LAB — GLUCOSE, CAPILLARY: Glucose-Capillary: 157 mg/dL — ABNORMAL HIGH (ref 70–99)

## 2023-12-10 NOTE — Progress Notes (Signed)
 Pt presents for BP check following initiation of Procardia  on 7/5 during MAU visit. She is transferring prenatal care to this office from [facility] and has New Ob intake appt scheduled on 7/17. BP - 138/84, P - 86. CBG was also requested for this visit.  Pt ate lunch 2 hrs ago (large serving of pasta and tuna fish).  CBG - 157. Pt was cautioned to limit carb servings per ADA recommendations. Per consult with Dr. Fredirick, pt was advised to continue taking Procardia  as prescribed. She will also need Diabetes Education appt which was scheduled on 12/23/23.  Pr voiced understanding of all information and instructions given.

## 2023-12-18 ENCOUNTER — Telehealth: Admitting: *Deleted

## 2023-12-18 DIAGNOSIS — E1169 Type 2 diabetes mellitus with other specified complication: Secondary | ICD-10-CM | POA: Diagnosis not present

## 2023-12-18 DIAGNOSIS — O99212 Obesity complicating pregnancy, second trimester: Secondary | ICD-10-CM

## 2023-12-18 DIAGNOSIS — E119 Type 2 diabetes mellitus without complications: Secondary | ICD-10-CM

## 2023-12-18 DIAGNOSIS — Z3A16 16 weeks gestation of pregnancy: Secondary | ICD-10-CM

## 2023-12-18 DIAGNOSIS — Z794 Long term (current) use of insulin: Secondary | ICD-10-CM

## 2023-12-18 DIAGNOSIS — E1159 Type 2 diabetes mellitus with other circulatory complications: Secondary | ICD-10-CM

## 2023-12-18 DIAGNOSIS — A601 Herpesviral infection of perianal skin and rectum: Secondary | ICD-10-CM

## 2023-12-18 DIAGNOSIS — E785 Hyperlipidemia, unspecified: Secondary | ICD-10-CM

## 2023-12-18 DIAGNOSIS — R87612 Low grade squamous intraepithelial lesion on cytologic smear of cervix (LGSIL): Secondary | ICD-10-CM

## 2023-12-18 DIAGNOSIS — O99210 Obesity complicating pregnancy, unspecified trimester: Secondary | ICD-10-CM | POA: Insufficient documentation

## 2023-12-18 DIAGNOSIS — O0992 Supervision of high risk pregnancy, unspecified, second trimester: Secondary | ICD-10-CM

## 2023-12-18 DIAGNOSIS — I152 Hypertension secondary to endocrine disorders: Secondary | ICD-10-CM

## 2023-12-18 DIAGNOSIS — O0990 Supervision of high risk pregnancy, unspecified, unspecified trimester: Secondary | ICD-10-CM | POA: Insufficient documentation

## 2023-12-18 NOTE — Progress Notes (Signed)
 New OB Intake  I connected with Summer Campbell  on 12/18/23 at  1:15 PM EDT by MyChart Video Visit and verified that I am speaking with the correct person using two identifiers. Nurse is located at [facility] and pt is located at home.  I discussed the limitations, risks, security and privacy concerns of performing an evaluation and management service by telephone and the availability of in person appointments. I also discussed with the patient that there may be a patient responsible charge related to this service. The patient expressed understanding and agreed to proceed.  I explained I am completing New OB Intake today. We discussed EDD of 06/01/24 based on US  at 8.3 weeks. Pt is G2P0010. I reviewed her allergies, medications and Medical/Surgical/OB history.    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 Discussed the nature of our practice with multiple providers including residents and students. Due to the size of the practice, the delivering provider may not be the same as those providing prenatal care.   Patient is not a candidate for  water birth.  MyChart/Babyscripts MyChart access verified. I explained pt will have some visits in office and some virtually. Babyscripts instructions given and order placed.   Blood Pressure Cuff/Weight Scale She has a blood pressure cuff . Explained after first prenatal appt pt will check weekly and document in Babyscripts. Patient does have weight  scale.  Anatomy US  Explained next scheduled US  will be around 19 weeks. Anatomy US  will be scheduled and patent notified by MyChart.   Is patient a CenteringPregnancy candidate?  Not a Candidate Not a candidate due to DM and CHTN   Is patient a Mom+Baby Combined Care candidate?  Accepted   Confirmed patient does not intend to move from the area for at least 12 months, and notified Mom+Baby staff  Is patient a candidate for Babyscripts Optimization? No, due to [facility], DM Type 2.   First visit review I reviewed new OB appt with patient. Explained pt will be seen by Dr. Herchel at first visit. Discussed [place] genetic screening with patient. She would like both Panorama and Horizon drawn with routine prenatal labs at new ob visit.    Last Pap No results found for: EDMON Rock Skip OBIE 12/18/2023  2:09 PM

## 2023-12-23 ENCOUNTER — Other Ambulatory Visit

## 2023-12-29 NOTE — Progress Notes (Unsigned)
 Patient was seen for Pre-existing Diabetes During Pregnancy on 01/01/2024  Start time 1118 and End time 1210   Estimated due date: 06/01/2024; 106w2d  Clinical: Medications:  Current Outpatient Medications:    aspirin  EC 81 MG tablet, Take 2 tablets (162 mg total) by mouth at bedtime. Start taking when you are [redacted] weeks pregnant for rest of pregnancy for prevention of preeclampsia, Disp: 300 tablet, Rfl: 2   cyanocobalamin (VITAMIN B12) 1000 MCG tablet, Take 1,000 mcg by mouth daily., Disp: , Rfl:    escitalopram (LEXAPRO) 20 MG tablet, Take 20 mg by mouth daily., Disp: , Rfl:    famotidine (PEPCID) 20 MG tablet, Take 20 mg by mouth 2 (two) times daily., Disp: , Rfl:    insulin  aspart (NOVOLOG  FLEXPEN) 100 UNIT/ML FlexPen, Inject 20 Units into the skin 3 (three) times daily with meals. (Patient taking differently: Inject 20 Units into the skin 3 (three) times daily with meals. Pt is taking 4 units after a single meal once daily), Disp: 30 mL, Rfl: 2   insulin  degludec (TRESIBA) 200 UNIT/ML FlexTouch Pen, Inject 30 Units into the skin daily., Disp: , Rfl:    Insulin  Pen Needle (PEN NEEDLES) 32G X 4 MM MISC, 1 each by Does not apply route., Disp: , Rfl:    metFORMIN  (GLUCOPHAGE ) 500 MG tablet, Take 2 tablets (1,000 mg total) by mouth 2 (two) times daily with a meal., Disp: 120 tablet, Rfl: 2   NIFEdipine  (PROCARDIA  XL/NIFEDICAL XL) 60 MG 24 hr tablet, Take 1 tablet (60 mg total) by mouth daily., Disp: 90 tablet, Rfl: 1   ondansetron  (ZOFRAN  ODT) 8 MG disintegrating tablet, 8mg  ODT q8 hours prn nausea, Disp: 10 tablet, Rfl: 0   Prenatal MV & Min w/FA-DHA (PRENATAL GUMMIES PO), Take 2 tablets by mouth daily at 6 (six) AM., Disp: , Rfl:    ALPHA LIPOIC ACID PO, Take 1 tablet by mouth daily at 6 (six) AM., Disp: , Rfl:    Calcium Carb-Cholecalciferol (CALCIUM 600+D3) 600-20 MG-MCG TABS, Take 1 tablet by mouth daily at 6 (six) AM., Disp: , Rfl:    Cholecalciferol (VITAMIN D-3) 125 MCG (5000 UT) TABS,  Take 1 tablet by mouth daily at 6 (six) AM., Disp: , Rfl:    Continuous Glucose Sensor (DEXCOM G7 SENSOR) MISC, Use one sensor every 10 days as instructed (Patient not taking: Reported on 01/01/2024), Disp: 9 each, Rfl: 3  Medical History:  Past Medical History:  Diagnosis Date   Bipolar affective disorder (HCC)    Depression    Diabetes mellitus without complication (HCC)    Hypertension     Labs: OGTT: none No results found for: HGBA1C 10.3% obtained 09/25/2023 per chart  Dietary and Lifestyle History: Pt presents today with her significant other. Pt reports she does not have her meter with her. Pt reports testing blood sugar twice daily upon waking and 1-2 hours after dinner. Pt reports taking 4 units of novolog  after dinner and 30 units of tresiba once daily near dinner. Pt reports this working full time mostly standing. Pt reports she will not take her novolog  if her pre meal blood sugar is less than 120 mg/dL. Pt encouraged to aim for consistent carbohydrate intake with meals and snacks. Pt was able to accurately determine carbohydrate content using a nutrition label during today's encounter. Pt verbalized understanding to take [place] near the same hour each daily and to take novolog  before the first bite of meals with pre meal blood sugars over 100 mg/dL.  Pt encouraged to purchase and carry fast acting sugar source and avoid skipping meals. All Pt's questions were answered during this encounter  Physical Activity: Sedentary  Stress: low self care read, draw Sleep: ok   24 hr Recall:  First Meal:  home fries, breaded chicken nuggets, water, gatorade zero (110 mg/dL, reported as 2 hour post prandial) no novolog   Snack:  banana or orange, water  Second meal:  potatoes, chicken, corn, water Snack:  cashews Third meal:  ~4-5 pm: french fries, breaded chicken nuggets (156 mg/dL, reported as 2 hour post prandial or beef roast, potato salad, rice with gravy or chicken, baby corn,  carrots, broccoli, snap pea, onions, water Snack:  fruit or carrots or banana Beverages:  water, sprite zero, gatorade zero   NUTRITION INTERVENTION  Nutrition education (E-1) on the following topics:   Initial Follow-up  [x]  []  Definition of Gestational Diabetes [x]  []  Why dietary management is important in controlling blood glucose [x]  []  Effects each nutrient has on blood glucose levels [x]  []  Simple carbohydrates vs complex carbohydrates [x]  []  Fluid intake [x]  []  Creating a balanced meal plan [x]  []  Carbohydrate counting  [x]  []  When to check blood glucose levels [x]  []  Proper blood glucose monitoring techniques [x]  []  Effect of stress and stress reduction techniques  [x]  []  Exercise effect on blood glucose levels, appropriate exercise during pregnancy [x]  []  Importance of limiting caffeine and abstaining from alcohol and smoking [x]  []  Medications used for blood sugar control during pregnancy [x]  []  Hypoglycemia and rule of 15 []  []  Postpartum self care  Patient has a meter prior to visit. Patient is instructed to begin testing pre meal and 2 hours after each meal. Patient instructed to monitor glucose levels: QID FBS: 60 - <= 95 mg/dL; 2 hour: <= 879 mg/dL    Patient received handouts: Nutrition Diabetes and Pregnancy Carbohydrate Counting List Blood glucose log Snack ideas for diabetes during pregnancy Plate Planner  Patient will be seen for follow-up: 02/05/2024

## 2023-12-30 ENCOUNTER — Other Ambulatory Visit: Payer: Self-pay

## 2023-12-30 DIAGNOSIS — Z794 Long term (current) use of insulin: Secondary | ICD-10-CM

## 2024-01-01 ENCOUNTER — Other Ambulatory Visit (HOSPITAL_COMMUNITY)
Admission: RE | Admit: 2024-01-01 | Discharge: 2024-01-01 | Disposition: A | Discharge status: Home / Self Care | Source: Ambulatory Visit | Attending: Obstetrics & Gynecology | Admitting: Obstetrics & Gynecology

## 2024-01-01 ENCOUNTER — Encounter: Attending: Family Medicine | Admitting: Dietician

## 2024-01-01 ENCOUNTER — Ambulatory Visit: Payer: Self-pay | Admitting: Obstetrics & Gynecology

## 2024-01-01 ENCOUNTER — Other Ambulatory Visit: Payer: Self-pay

## 2024-01-01 ENCOUNTER — Ambulatory Visit

## 2024-01-01 VITALS — BP 158/92 | HR 80 | Wt 301.0 lb

## 2024-01-01 DIAGNOSIS — Z3A18 18 weeks gestation of pregnancy: Secondary | ICD-10-CM

## 2024-01-01 DIAGNOSIS — O24312 Unspecified pre-existing diabetes mellitus in pregnancy, second trimester: Secondary | ICD-10-CM

## 2024-01-01 DIAGNOSIS — O24319 Unspecified pre-existing diabetes mellitus in pregnancy, unspecified trimester: Secondary | ICD-10-CM | POA: Insufficient documentation

## 2024-01-01 DIAGNOSIS — O10919 Unspecified pre-existing hypertension complicating pregnancy, unspecified trimester: Secondary | ICD-10-CM | POA: Diagnosis present

## 2024-01-01 DIAGNOSIS — E119 Type 2 diabetes mellitus without complications: Secondary | ICD-10-CM | POA: Diagnosis present

## 2024-01-01 DIAGNOSIS — O0992 Supervision of high risk pregnancy, unspecified, second trimester: Secondary | ICD-10-CM | POA: Diagnosis not present

## 2024-01-01 DIAGNOSIS — O10912 Unspecified pre-existing hypertension complicating pregnancy, second trimester: Secondary | ICD-10-CM | POA: Diagnosis not present

## 2024-01-01 DIAGNOSIS — O0990 Supervision of high risk pregnancy, unspecified, unspecified trimester: Secondary | ICD-10-CM | POA: Diagnosis present

## 2024-01-01 DIAGNOSIS — Z713 Dietary counseling and surveillance: Secondary | ICD-10-CM | POA: Diagnosis not present

## 2024-01-01 DIAGNOSIS — Z794 Long term (current) use of insulin: Secondary | ICD-10-CM | POA: Diagnosis not present

## 2024-01-01 DIAGNOSIS — O24112 Pre-existing diabetes mellitus, type 2, in pregnancy, second trimester: Secondary | ICD-10-CM

## 2024-01-01 DIAGNOSIS — O99212 Obesity complicating pregnancy, second trimester: Secondary | ICD-10-CM

## 2024-01-01 LAB — GC/CHLAMYDIA PROBE AMP (~~LOC~~) NOT AT ARMC
Chlamydia: NEGATIVE
Comment: NEGATIVE
Comment: NORMAL
Neisseria Gonorrhea: NEGATIVE

## 2024-01-01 MED ORDER — DEXCOM G7 SENSOR MISC: 3 refills | Status: DC

## 2024-01-01 MED ORDER — ASPIRIN 81 MG PO TBEC: 162.0000 mg | DELAYED_RELEASE_TABLET | Freq: Every day | ORAL | 2 refills | Status: DC

## 2024-01-01 MED ORDER — NIFEDIPINE ER OSMOTIC RELEASE 60 MG PO TB24: 60.0000 mg | ORAL_TABLET | Freq: Every day | ORAL | 1 refills | Status: DC

## 2024-01-01 NOTE — Progress Notes (Unsigned)
 INITIAL PRENATAL VISIT  History:  Summer Campbell is a 33 y.o. G2P0010 at [redacted]w[redacted]d by early ultrasound being seen today for her first obstetrical visit.  Her obstetrical history is significant for one early SAB.  Her medical history is significant for insulin  dependent Type 2 DM and CHTN on medications.  Not checking blood sugars, has no log today.  She is scheduled to meet with DM education today.  Patient reports no complaints.  Here with FOB.  HISTORY: OB History  Gravida Para Term Preterm AB Living  2 0 0 0 1 0  SAB IAB Ectopic Multiple Live Births  1 0 0 0 0    # Outcome Date GA Lbr Len/2nd Weight Sex Type Anes PTL Lv  2 Current           1 SAB 02/15/14 [redacted]w[redacted]d            Birth Comments: had + pregnancy test at home and passed tissue/ blood before saw provider.    Last pap smear was done 10/16/2023 and was normal  Past Medical History:  Diagnosis Date   Bipolar affective disorder (HCC)    Depression    Diabetes mellitus without complication (HCC)    Hypertension    Past Surgical History:  Procedure Laterality Date   ADENOIDECTOMY     TONSILLECTOMY     Family History  Problem Relation Age of Onset   Drug abuse Mother    Alcohol abuse Mother    Depression Mother    Hypertension Mother    Diabetes Mother    Bipolar disorder Mother    Schizophrenia Mother    Pancreatitis Mother    Alcohol abuse Father    Stroke Father    Depression Father    Hypertension Father    Diabetes Father    Prostate cancer Father    Liver disease Father    Kidney disease Father    Suicidality Father    Social History   Tobacco Use   Smoking status: Never   Smokeless tobacco: Never  Vaping Use   Vaping status: Never Used  Substance Use Topics   Alcohol use: Not Currently    Comment: hx of alcohol abuse but stopped drinking heavily 01/22/2018, then socially   Drug use: Not Currently    Types: Marijuana    Comment: just after Dad passed away, not since 01/22/22   No Known  Allergies Current Outpatient Medications on File Prior to Visit  Medication Sig Dispense Refill   ALPHA LIPOIC ACID PO Take 1 tablet by mouth daily at 6 (six) AM.     Calcium Carb-Cholecalciferol (CALCIUM 600+D3) 600-20 MG-MCG TABS Take 1 tablet by mouth daily at 6 (six) AM.     Cholecalciferol (VITAMIN D-3) 125 MCG (5000 UT) TABS Take 1 tablet by mouth daily at 6 (six) AM.     cyanocobalamin (VITAMIN B12) 1000 MCG tablet Take 1,000 mcg by mouth daily.     escitalopram (LEXAPRO) 20 MG tablet Take 20 mg by mouth daily.     famotidine (PEPCID) 20 MG tablet Take 20 mg by mouth 2 (two) times daily.     insulin  aspart (NOVOLOG  FLEXPEN) 100 UNIT/ML FlexPen Inject 20 Units into the skin 3 (three) times daily with meals. (Patient taking differently: Inject 20 Units into the skin 3 (three) times daily with meals. Pt is taking 4 units after a single meal once daily) 30 mL 2   insulin  degludec (TRESIBA) 200 UNIT/ML FlexTouch Pen Inject 30 Units  into the skin daily.     Insulin  Pen Needle (PEN NEEDLES) 32G X 4 MM MISC 1 each by Does not apply route.     metFORMIN  (GLUCOPHAGE ) 500 MG tablet Take 2 tablets (1,000 mg total) by mouth 2 (two) times daily with a meal. 120 tablet 2   ondansetron  (ZOFRAN  ODT) 8 MG disintegrating tablet 8mg  ODT q8 hours prn nausea 10 tablet 0   Prenatal MV & Min w/FA-DHA (PRENATAL GUMMIES PO) Take 2 tablets by mouth daily at 6 (six) AM.     No current facility-administered medications on file prior to visit.   Review of Systems Pertinent items noted in HPI and remainder of comprehensive ROS otherwise negative.  Indications for ASA therapy (per UpToDate) One of the following: (Needs 162 mg daily) Chronic hypertension Yes Type 1 or 2 diabetes mellitus Yes  Physical Exam:   Vitals:   01/01/24 0931  BP: (!) 158/92  Pulse: 80  Weight: (!) 301 lb (136.5 kg)   Fetal Heart Rate (bpm): 155   General: well-developed, well-nourished female in no acute distress  Breasts:   deferred  Skin: normal coloration and turgor, no rashes  Neurologic: oriented, normal, negative, normal mood  Extremities: normal strength, tone, and muscle mass, ROM of all joints is normal  HEENT PERRLA, extraocular movement intact and sclera clear, anicteric  Neck supple and no masses  Cardiovascular: regular rate and rhythm  Respiratory:  no respiratory distress, normal breath sounds  Abdomen: soft, non-tender; bowel sounds normal; no masses,  no organomegaly  Pelvic: deferred   Assessment:  Pregnancy: G2P0010 Patient Active Problem List   Diagnosis Date Noted   Preexisting diabetes complicating pregnancy, antepartum 01/01/2024   Preexisting hypertension complicating pregnancy, antepartum 01/01/2024   Supervision of high risk pregnancy, antepartum 12/18/2023   Maternal morbid obesity, antepartum (HCC) 12/18/2023   LGSIL on Pap smear of cervix 06/21/2022   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/24/2020   Hyperlipidemia associated with type 2 diabetes mellitus (HCC) 10/24/2020   Hypertension associated with diabetes (HCC) 10/24/2020   Type 2 diabetes mellitus without complication, with long-term current use of insulin  (HCC) 10/24/2020    Plan:  1. Preexisting diabetes complicating pregnancy, antepartum (Primary) Discussed implications of DM in pregnancy, need for optimizing glycemic control to decrease DM associated maternal-fetal morbidity and mortality, need for antenatal testing and frequent ultrasounds/prenatal visits. Will check baseline labs today, get DM education and DM testing supplies today. Will check log in 2 weeks and change insulin  therapy accordingly.  Of note, patient reports having an eye exam done two months ago that was not concerning. - AFP, Serum, Open Spina Bifida - Culture, OB Urine - CBC/D/Plt+RPR+Rh+ABO+RubIgG... - PANORAMA PRENATAL TEST - HORIZON Basic Panel - Comprehensive metabolic panel with GFR - Protein / creatinine ratio, urine -  GC/Chlamydia probe amp ([place])not at [facility] - Hemoglobin A1c - aspirin  EC 81 MG tablet; Take 2 tablets (162 mg total) by mouth at bedtime. Start taking when you are [redacted] weeks pregnant for rest of pregnancy for prevention of preeclampsia  Dispense: 300 tablet; Refill: 2 - AMB Referral to Cardio Obstetrics - TSH Rfx on Abnormal to Free T4 - Ambulatory referral to Pediatric Cardiology - Continuous Glucose Sensor (DEXCOM G7 SENSOR) MISC; Use one sensor every 10 days as instructed (Patient not taking: Reported on 01/01/2024)  Dispense: 9 each; Refill: 3  2. Preexisting hypertension complicating pregnancy, antepartum Discussed implications of CHTN in pregnancy, need for antenatal testing and frequent ultrasounds/prenatal visits, need for  optimizing BP control to decrease CHTN/preeclampsia associated maternal-fetal morbidity and mortality. Will check baseline labs today. - AFP, Serum, Open Spina Bifida - Culture, OB Urine - CBC/D/Plt+RPR+Rh+ABO+RubIgG... - PANORAMA PRENATAL TEST - HORIZON Basic Panel - Comprehensive metabolic panel with GFR - Protein / creatinine ratio, urine - GC/Chlamydia probe amp ([place])not at [facility] - Hemoglobin A1c - aspirin  EC 81 MG tablet; Take 2 tablets (162 mg total) by mouth at bedtime. Start taking when you are [redacted] weeks pregnant for rest of pregnancy for prevention of preeclampsia  Dispense: 300 tablet; Refill: 2 - AMB Referral to Cardio Obstetrics - TSH Rfx on Abnormal to Free T4 - NIFEdipine  (PROCARDIA  XL/NIFEDICAL XL) 60 MG 24 hr tablet; Take 1 tablet (60 mg total) by mouth daily.  Dispense: 90 tablet; Refill: 1  3. Maternal morbid obesity, antepartum (HCC) TWG 11-20 lbs recommended.  4. [redacted] weeks gestation of pregnancy 5. Supervision of high risk pregnancy, antepartum - AFP, Serum, Open Spina Bifida - Culture, OB Urine - CBC/D/Plt+RPR+Rh+ABO+RubIgG... - PANORAMA PRENATAL TEST - HORIZON Basic Panel - Comprehensive metabolic panel with GFR - Protein /  creatinine ratio, urine - GC/Chlamydia probe amp ([place])not at [facility] - Hemoglobin A1c - aspirin  EC 81 MG tablet; Take 2 tablets (162 mg total) by mouth at bedtime. Start taking when you are [redacted] weeks pregnant for rest of pregnancy for prevention of preeclampsia  Dispense: 300 tablet; Refill: 2 - AMB Referral to Cardio Obstetrics - TSH Rfx on Abnormal to Free T4 - Ambulatory referral to Pediatric Cardiology - NIFEdipine  (PROCARDIA  XL/NIFEDICAL XL) 60 MG 24 hr tablet; Take 1 tablet (60 mg total) by mouth daily.  Dispense: 90 tablet; Refill: 1 - Continuous Glucose Sensor (DEXCOM G7 SENSOR) MISC; Use one sensor every 10 days as instructed (Patient not taking: Reported on 01/01/2024)  Dispense: 9 each; Refill: 3   Initial labs drawn. Continue prenatal vitamins. Problem list reviewed and updated. Genetic Screening discussed, Panorama and Horizon: ordered. Ultrasound discussed; fetal anatomic survey: scheduled. Anticipatory guidance about prenatal visits given including labs, ultrasounds, and testing. Discussed usage of the Babyscripts app for more information about pregnancy, and to track blood pressures. Also discussed usage of virtual visits as additional source of managing and completing prenatal visits.  Patient was encouraged to use MyChart to review results, send requests, and have questions addressed.   The nature of Center for [facility] with multiple MDs and Advanced Practice Providers was explained to patient; also emphasized that residents, students are part of our team. Routine obstetric precautions reviewed. Encouraged to seek out care at our office or emergency room [facility] preferred) for urgent and/or emergent concerns. Return in about 2 weeks (around 01/15/2024) for OFFICE OB VISIT (MD only) and blood sugar review.     GLORIS HUGGER, MD, FACOG Obstetrician & Gynecologist, [facility] for [employer], [facility]

## 2024-01-02 ENCOUNTER — Ambulatory Visit: Payer: Self-pay | Admitting: Obstetrics & Gynecology

## 2024-01-02 LAB — TSH RFX ON ABNORMAL TO FREE T4: TSH: 1.67 u[IU]/mL (ref 0.450–4.500)

## 2024-01-02 LAB — PROTEIN / CREATININE RATIO, URINE
Creatinine, Urine: 62.1 mg/dL
Protein, Ur: 13.2 mg/dL
Protein/Creat Ratio: 213 mg/g{creat} — ABNORMAL HIGH (ref 0–200)

## 2024-01-03 LAB — COMPREHENSIVE METABOLIC PANEL WITH GFR
ALT: 17 IU/L (ref 0–32)
AST: 12 IU/L (ref 0–40)
Albumin: 3.4 g/dL — ABNORMAL LOW (ref 3.9–4.9)
Alkaline Phosphatase: 61 IU/L (ref 44–121)
BUN/Creatinine Ratio: 18 (ref 9–23)
BUN: 10 mg/dL (ref 6–20)
Bilirubin Total: <0.2 mg/dL (ref 0.0–1.2)
CO2: 20 mmol/L (ref 20–29)
Calcium: 8.9 mg/dL (ref 8.7–10.2)
Chloride: 103 mmol/L (ref 96–106)
Creatinine, Ser: 0.56 mg/dL — ABNORMAL LOW (ref 0.57–1.00)
Globulin, Total: 2.4 g/dL (ref 1.5–4.5)
Glucose: 72 mg/dL (ref 70–99)
Potassium: 4.7 mmol/L (ref 3.5–5.2)
Sodium: 137 mmol/L (ref 134–144)
Total Protein: 5.8 g/dL — ABNORMAL LOW (ref 6.0–8.5)
eGFR: 124 mL/min/1.73 (ref >59)

## 2024-01-03 LAB — CBC/D/PLT+RPR+RH+ABO+RUBIGG...
Antibody Screen: NEGATIVE
Basophils Absolute: 0 x10E3/uL (ref 0.0–0.2)
Basos: 0 %
EOS (ABSOLUTE): 0.2 x10E3/uL (ref 0.0–0.4)
Eos: 2 %
HCV Ab: NONREACTIVE
HIV Screen 4th Generation wRfx: NONREACTIVE
Hematocrit: 42.5 % (ref 34.0–46.6)
Hemoglobin: 13.3 g/dL (ref 11.1–15.9)
Hepatitis B Surface Ag: NEGATIVE
Immature Grans (Abs): 0 x10E3/uL (ref 0.0–0.1)
Immature Granulocytes: 0 %
Lymphocytes Absolute: 2.4 x10E3/uL (ref 0.7–3.1)
Lymphs: 20 %
MCH: 28.7 pg (ref 26.6–33.0)
MCHC: 31.3 g/dL — ABNORMAL LOW (ref 31.5–35.7)
MCV: 92 fL (ref 79–97)
Monocytes Absolute: 0.7 x10E3/uL (ref 0.1–0.9)
Monocytes: 5 %
Neutrophils Absolute: 8.7 x10E3/uL — ABNORMAL HIGH (ref 1.4–7.0)
Neutrophils: 73 %
Platelets: 316 x10E3/uL (ref 150–450)
RBC: 4.63 x10E6/uL (ref 3.77–5.28)
RDW: 12.5 % (ref 11.7–15.4)
RPR Ser Ql: NONREACTIVE
Rh Factor: POSITIVE
Rubella Antibodies, IGG: 1.47 {index} (ref >0.99)
WBC: 12 x10E3/uL — ABNORMAL HIGH (ref 3.4–10.8)

## 2024-01-03 LAB — AFP, SERUM, OPEN SPINA BIFIDA
AFP MoM: 1.26
AFP Value: 30.1 ng/mL
Gest. Age on Collection Date: 18.2 wk
Maternal Age At EDD: 33.1 a
OSBR Risk 1 IN: 1615
Test Results:: NEGATIVE
Weight: 301 [lb_av]

## 2024-01-03 LAB — HEMOGLOBIN A1C
Est. average glucose Bld gHb Est-mCnc: 148 mg/dL
Hgb A1c MFr Bld: 6.8 % — ABNORMAL HIGH (ref 4.8–5.6)

## 2024-01-03 LAB — HCV INTERPRETATION

## 2024-01-03 LAB — CULTURE, OB URINE

## 2024-01-03 LAB — URINE CULTURE, OB REFLEX: Organism ID, Bacteria: NO GROWTH

## 2024-01-05 ENCOUNTER — Encounter: Payer: Self-pay | Admitting: *Deleted

## 2024-01-07 LAB — PANORAMA PRENATAL TEST FULL PANEL:PANORAMA TEST PLUS 5 ADDITIONAL MICRODELETIONS: FETAL FRACTION: 2.2

## 2024-01-07 NOTE — Telephone Encounter (Addendum)
-----   Message from Gloris Hugger sent at 01/07/2024  7:58 AM EDT ----- Insufficient fetal DNA,  recollection recommended. Please call to inform patient of results and recommendations.  ----- Message ----- From: Interface, Lab In [facility] [place] Seven Sent: 01/01/2024   9:14 PM EDT To: Gloris DELENA Hugger, MD  Pt notified of results and the need for redraw of Panorama testing.  Pt stated that she will need to send a MyChart message to be able to schedule lab appt.  I advised pt that I would be expecting her message to schedule her lab appt.  Pt verbalized understanding with no further questions.  Lashonne Shull,RN

## 2024-01-08 ENCOUNTER — Other Ambulatory Visit: Payer: Self-pay

## 2024-01-08 ENCOUNTER — Other Ambulatory Visit

## 2024-01-08 DIAGNOSIS — Z31430 Encounter of female for testing for genetic disease carrier status for procreative management: Secondary | ICD-10-CM

## 2024-01-08 DIAGNOSIS — Z3490 Encounter for supervision of normal pregnancy, unspecified, unspecified trimester: Secondary | ICD-10-CM

## 2024-01-08 DIAGNOSIS — Z3482 Encounter for supervision of other normal pregnancy, second trimester: Secondary | ICD-10-CM

## 2024-01-12 LAB — HORIZON CUSTOM: REPORT SUMMARY: NEGATIVE

## 2024-01-15 ENCOUNTER — Encounter: Payer: Self-pay | Admitting: Obstetrics and Gynecology

## 2024-01-15 DIAGNOSIS — O0990 Supervision of high risk pregnancy, unspecified, unspecified trimester: Secondary | ICD-10-CM

## 2024-01-15 DIAGNOSIS — O24319 Unspecified pre-existing diabetes mellitus in pregnancy, unspecified trimester: Secondary | ICD-10-CM

## 2024-01-15 DIAGNOSIS — O10919 Unspecified pre-existing hypertension complicating pregnancy, unspecified trimester: Secondary | ICD-10-CM

## 2024-01-15 LAB — PANORAMA PRENATAL TEST FULL PANEL:PANORAMA TEST PLUS 5 ADDITIONAL MICRODELETIONS: FETAL FRACTION: 3

## 2024-01-15 MED ORDER — NIFEDIPINE ER OSMOTIC RELEASE 60 MG PO TB24: 60.0000 mg | ORAL_TABLET | Freq: Two times a day (BID) | ORAL | 3 refills | Status: DC

## 2024-01-16 ENCOUNTER — Encounter: Admitting: Family Medicine

## 2024-01-16 ENCOUNTER — Ambulatory Visit: Payer: Self-pay | Admitting: Obstetrics & Gynecology

## 2024-01-18 ENCOUNTER — Encounter: Payer: Self-pay | Admitting: Obstetrics and Gynecology

## 2024-01-19 ENCOUNTER — Encounter: Payer: Self-pay | Admitting: *Deleted

## 2024-01-21 ENCOUNTER — Encounter: Payer: Self-pay | Admitting: Obstetrics & Gynecology

## 2024-01-24 ENCOUNTER — Other Ambulatory Visit: Payer: Self-pay | Admitting: Medical Genetics

## 2024-01-24 ENCOUNTER — Encounter: Payer: Self-pay | Admitting: Obstetrics & Gynecology

## 2024-01-25 ENCOUNTER — Encounter: Payer: Self-pay | Admitting: Obstetrics and Gynecology

## 2024-01-27 ENCOUNTER — Other Ambulatory Visit: Payer: Self-pay

## 2024-01-27 ENCOUNTER — Ambulatory Visit: Admitting: Family Medicine

## 2024-01-27 ENCOUNTER — Encounter: Payer: Self-pay | Admitting: Family Medicine

## 2024-01-27 VITALS — BP 147/92 | HR 101 | Wt 313.5 lb

## 2024-01-27 DIAGNOSIS — O0992 Supervision of high risk pregnancy, unspecified, second trimester: Secondary | ICD-10-CM | POA: Diagnosis not present

## 2024-01-27 DIAGNOSIS — O10919 Unspecified pre-existing hypertension complicating pregnancy, unspecified trimester: Secondary | ICD-10-CM

## 2024-01-27 DIAGNOSIS — O99212 Obesity complicating pregnancy, second trimester: Secondary | ICD-10-CM

## 2024-01-27 DIAGNOSIS — O10912 Unspecified pre-existing hypertension complicating pregnancy, second trimester: Secondary | ICD-10-CM | POA: Diagnosis not present

## 2024-01-27 DIAGNOSIS — O24319 Unspecified pre-existing diabetes mellitus in pregnancy, unspecified trimester: Secondary | ICD-10-CM

## 2024-01-27 DIAGNOSIS — A601 Herpesviral infection of perianal skin and rectum: Secondary | ICD-10-CM

## 2024-01-27 DIAGNOSIS — O0990 Supervision of high risk pregnancy, unspecified, unspecified trimester: Secondary | ICD-10-CM

## 2024-01-27 DIAGNOSIS — O24312 Unspecified pre-existing diabetes mellitus in pregnancy, second trimester: Secondary | ICD-10-CM

## 2024-01-27 DIAGNOSIS — Z3A22 22 weeks gestation of pregnancy: Secondary | ICD-10-CM

## 2024-01-27 MED ORDER — DEXCOM G7 SENSOR MISC: 3 refills | Status: AC

## 2024-01-27 MED ORDER — ONDANSETRON 8 MG PO TBDP: ORAL_TABLET | ORAL | 0 refills | Status: DC

## 2024-01-27 MED ORDER — METFORMIN HCL 500 MG PO TABS
1000.0000 mg | ORAL_TABLET | Freq: Two times a day (BID) | ORAL | 2 refills | Status: AC
Start: 2024-01-27 — End: ?

## 2024-01-27 NOTE — Progress Notes (Signed)
 PRENATAL VISIT NOTE  Subjective:  Summer Campbell is a 33 y.o. G2P0010 at [redacted]w[redacted]d being seen today for ongoing prenatal care.  She is currently monitored for the following issues for this high-risk pregnancy and has Herpes simplex infection of perianal skin; Hyperlipidemia associated with type 2 diabetes mellitus (HCC); Hypertension associated with diabetes (HCC); Type 2 diabetes mellitus without complication, with long-term current use of insulin  (HCC); LGSIL on Pap smear of cervix; Supervision of high risk pregnancy, antepartum; Maternal morbid obesity, antepartum (HCC); Preexisting diabetes complicating pregnancy, antepartum; and Preexisting hypertension complicating pregnancy, antepartum on their problem list.  Patient reports no complaints. She reports meeting with the diabetes educator was very helpful as she learned many things that she did not know before like how to count carbs, that carbs increase blood sugar, and how to correctly take her insulin . She reports that she has only been taking her Procardia  once daily and did not know that she should be taking it twice daily. She is taking her aspirin  consistently once daily.  . Vag. Bleeding: None.  Movement: Present. Denies leaking of fluid.   The following portions of the patient's history were reviewed and updated as appropriate: allergies, current medications, past family history, past medical history, past social history, past surgical history and problem list.   Objective:    Vitals:   01/27/24 1408 01/27/24 1419  BP: (!) 144/89 (!) 147/92  Pulse: 76 (!) 101  Weight: (!) 313 lb 8 oz (142.2 kg)     Fetal Status:  Fetal Heart Rate (bpm):  (Could not find FHR with Doppler. Bedside US  showed vigorous fetal movement and normal-appearing heart rate, specific rate could not be measured due to fetal movement)   Movement: Present    General: Alert, oriented and cooperative. Patient is in no acute distress.  Skin: Skin is warm and dry. No  rash noted.   Cardiovascular: Normal heart rate noted  Respiratory: Normal respiratory effort, no problems with respiration noted  Abdomen: Soft, gravid, appropriate for gestational age.  Pain/Pressure: Present     Pelvic: Cervical exam deferred        Extremities: Normal range of motion.     Mental Status: Normal mood and affect. Normal behavior. Normal judgment and thought content.   Assessment and Plan:  Pregnancy: G2P0010 at [redacted]w[redacted]d 1. Supervision of high risk pregnancy, antepartum (Primary) Prenatal course reviewed BP, HR, FHR within normal limits Feeling regular FM   2. Maternal morbid obesity, antepartum (HCC) Aspirin  162 mg daily  3. Preexisting hypertension complicating pregnancy, antepartum Blood pressures have been elevated at home and now in the office, reports she did not know to be taking nifedipine  twice a day. New regimen: Nifedipine  60 mg BID, start taking as prescribed Low threshold to add second agent if BP remains elevated New BP cuff provided, home cuff tested in office and was very inaccurate  Aspirin  81 mg daily  4. Preexisting diabetes complicating pregnancy, antepartum Current regimen: Metformin  1000 mg BID and Insulin , Tresiba 30 units once daily at night and Novolog  20 unites three times daily with meals if preprandial blood sugar >100mg /dL. CBG review: did not keep a log, only checks occasionally, reports fasting 70-90, 8pm postprandial after dinner around 140 Regimen changes: encouraged to continue decreasing sugar/carb intake and increasing protein and fiber. Emphasized the importance of checking sugars throughout the day to appropriately dose insulin . Ordered CGM since she usually forgets to check her blood sugar. Instructed to check sugars and keep log until CGM  arrives, new paper log provided today.   5. Herpes simplex infection of perianal skin Prophylaxis at 36 weeks  6. [redacted] weeks gestation of pregnancy  Preterm labor symptoms and general obstetric  precautions including but not limited to vaginal bleeding, contractions, leaking of fluid and fetal movement were reviewed in detail with the patient. Please refer to After Visit Summary for other counseling recommendations.   Return in about 4 weeks (around 02/24/2024) for [facility].  Future Appointments  Date Time Provider Department Center  01/30/2024 10:00 AM [facility] PROVIDER 1 [facility]  01/30/2024 10:30 AM [facility] US3 [facility]  02/05/2024  1:15 PM WMC-EDUCATION [facility]  02/23/2024  3:15 PM Leftwich-Kirby, Olam LABOR, CNM [facility]  03/05/2024  1:20 PM Tobb, Kardie, DO [facility] None  03/08/2024 11:15 AM Fredirick Glenys RAMAN, MD [facility]  03/18/2024  9:00 AM WL-LAB GENECONNECT [facility]-MLABL None  03/24/2024  1:35 PM Ilean Norleen GAILS, MD [facility]  04/06/2024  3:15 PM [facility]-GENERAL 1 [facility]  04/20/2024 10:55 AM [facility]-GENERAL 1 [facility]  05/04/2024 11:15 AM W[facility]-GENERAL 1 [facility]  05/11/2024  1:15 PM W[facility]-GENERAL 1 [facility]  05/18/2024 11:15 AM WMC-GENERAL 1 [facility]  05/25/2024  4:15 PM W[facility]-GENERAL 1 [facility]  06/01/2024  1:15 PM W[facility]-GENERAL 1 [facility]    Joesph LABOR Sear, PA

## 2024-01-27 NOTE — Progress Notes (Signed)
 Pt brought BP Cuff from home reading was: 167/103 p:76

## 2024-01-27 NOTE — Patient Instructions (Addendum)
 Nutritionist: Waddell Kanner [email or web address]  TikTok:b [email or web address] ?[email or web address]  Instagram: [email or web address]  [email or web address] 

## 2024-01-30 ENCOUNTER — Ambulatory Visit

## 2024-01-30 ENCOUNTER — Other Ambulatory Visit: Payer: Self-pay | Admitting: *Deleted

## 2024-01-30 ENCOUNTER — Ambulatory Visit: Attending: Obstetrics & Gynecology | Admitting: Obstetrics

## 2024-01-30 VITALS — BP 135/85 | HR 87

## 2024-01-30 DIAGNOSIS — O99212 Obesity complicating pregnancy, second trimester: Secondary | ICD-10-CM

## 2024-01-30 DIAGNOSIS — E119 Type 2 diabetes mellitus without complications: Secondary | ICD-10-CM

## 2024-01-30 DIAGNOSIS — E669 Obesity, unspecified: Secondary | ICD-10-CM | POA: Diagnosis not present

## 2024-01-30 DIAGNOSIS — E1159 Type 2 diabetes mellitus with other circulatory complications: Secondary | ICD-10-CM

## 2024-01-30 DIAGNOSIS — O24112 Pre-existing diabetes mellitus, type 2, in pregnancy, second trimester: Secondary | ICD-10-CM | POA: Diagnosis not present

## 2024-01-30 DIAGNOSIS — O98312 Other infections with a predominantly sexual mode of transmission complicating pregnancy, second trimester: Secondary | ICD-10-CM | POA: Insufficient documentation

## 2024-01-30 DIAGNOSIS — Z794 Long term (current) use of insulin: Secondary | ICD-10-CM

## 2024-01-30 DIAGNOSIS — O99210 Obesity complicating pregnancy, unspecified trimester: Secondary | ICD-10-CM

## 2024-01-30 DIAGNOSIS — A601 Herpesviral infection of perianal skin and rectum: Secondary | ICD-10-CM

## 2024-01-30 DIAGNOSIS — Z3A22 22 weeks gestation of pregnancy: Secondary | ICD-10-CM | POA: Diagnosis not present

## 2024-01-30 DIAGNOSIS — O99342 Other mental disorders complicating pregnancy, second trimester: Secondary | ICD-10-CM | POA: Diagnosis not present

## 2024-01-30 DIAGNOSIS — O10919 Unspecified pre-existing hypertension complicating pregnancy, unspecified trimester: Secondary | ICD-10-CM

## 2024-01-30 DIAGNOSIS — O10012 Pre-existing essential hypertension complicating pregnancy, second trimester: Secondary | ICD-10-CM | POA: Insufficient documentation

## 2024-01-30 DIAGNOSIS — F419 Anxiety disorder, unspecified: Secondary | ICD-10-CM | POA: Diagnosis not present

## 2024-01-30 DIAGNOSIS — O0992 Supervision of high risk pregnancy, unspecified, second trimester: Secondary | ICD-10-CM | POA: Insufficient documentation

## 2024-01-30 DIAGNOSIS — O0990 Supervision of high risk pregnancy, unspecified, unspecified trimester: Secondary | ICD-10-CM

## 2024-01-30 DIAGNOSIS — O99282 Endocrine, nutritional and metabolic diseases complicating pregnancy, second trimester: Secondary | ICD-10-CM | POA: Insufficient documentation

## 2024-01-30 DIAGNOSIS — Z363 Encounter for antenatal screening for malformations: Secondary | ICD-10-CM | POA: Insufficient documentation

## 2024-01-30 DIAGNOSIS — E785 Hyperlipidemia, unspecified: Secondary | ICD-10-CM | POA: Diagnosis not present

## 2024-01-30 DIAGNOSIS — O10912 Unspecified pre-existing hypertension complicating pregnancy, second trimester: Secondary | ICD-10-CM

## 2024-01-30 DIAGNOSIS — Z362 Encounter for other antenatal screening follow-up: Secondary | ICD-10-CM

## 2024-01-30 DIAGNOSIS — E1169 Type 2 diabetes mellitus with other specified complication: Secondary | ICD-10-CM

## 2024-01-30 DIAGNOSIS — O24319 Unspecified pre-existing diabetes mellitus in pregnancy, unspecified trimester: Secondary | ICD-10-CM

## 2024-01-30 NOTE — Progress Notes (Signed)
 MFM Consult Note  Summer Campbell is currently at 22 weeks and 3 days.  Summer Campbell was seen due to maternal obesity with a BMI of 51.6, pregestational diabetes treated with insulin , and chronic hypertension treated with nifedipine  XL 60 mg twice a day.  The patient reports that Summer Campbell has had diabetes for the past 17 years.  Her most recent hemoglobin A1c drawn 4 weeks ago was 6.8%.  Her blood pressure today was 135/85.  Summer Campbell denies any problems in her current pregnancy.    Summer Campbell had a cell free DNA test earlier in her pregnancy which indicated a low risk for trisomy 37, 71, and 13. A female fetus is predicted. An MSAFP was 1.26 MoM.  Summer Campbell was informed that the fetal growth and amniotic fluid level were appropriate for her gestational age.   The views of the fetal anatomy were extremely limited today due to maternal body habitus.  Fetal movements were noted throughout today's exam.  The patient was informed that anomalies may be missed due to technical limitations. If the fetus is in a suboptimal position or maternal habitus is increased, visualization of the fetus in the maternal uterus may be impaired.  The patient was informed that sometimes it may not be possible to visualize all views of the fetal anatomy during her prenatal ultrasound exams.  Summer Campbell was advised to continue using insulin  for treatment of diabetes throughout her pregnancy.    Summer Campbell was also advised to continue taking nifedipine  for blood pressure control.  Due to the increased risk of superimposed preeclampsia, Summer Campbell should continue taking 2 tablets of baby aspirin  daily for preeclampsia prophylaxis.  We will continue to follow her with monthly growth ultrasounds.  Weekly fetal testing should be started at around 32 weeks.  Delivery is recommended at around 37 weeks.    However, delivery prior to 37 weeks may be necessary should Summer Campbell develop superimposed preeclampsia.  Due to pregestational diabetes, Summer Campbell was given a referral to [facility]  pediatric cardiology for a fetal echocardiogram.    A follow-up exam was scheduled in 4 weeks to assess the fetal growth and to try to complete the views of the fetal anatomy.    The patient stated that all of her questions were answered today.  A total of 30 minutes was spent counseling and coordinating the care for this patient.  Greater than 50% of the time was spent in direct face-to-face contact.

## 2024-02-04 NOTE — Progress Notes (Unsigned)
 Patient was seen for Pre-existing Diabetes During Pregnancy on 02/05/2024 Start time 1300 and End time 1415   Estimated due date: 06/01/2024; [redacted]w[redacted]d  Clinical: Medications:  Current Outpatient Medications:    ALPHA LIPOIC ACID PO, Take 1 tablet by mouth daily at 6 (six) AM., Disp: , Rfl:    aspirin  EC 81 MG tablet, Take 2 tablets (162 mg total) by mouth at bedtime. Start taking when you are [redacted] weeks pregnant for rest of pregnancy for prevention of preeclampsia, Disp: 300 tablet, Rfl: 2   Calcium Carb-Cholecalciferol (CALCIUM 600+D3) 600-20 MG-MCG TABS, Take 1 tablet by mouth daily at 6 (six) AM., Disp: , Rfl:    Cholecalciferol (VITAMIN D-3) 125 MCG (5000 UT) TABS, Take 1 tablet by mouth daily at 6 (six) AM., Disp: , Rfl:    Continuous Glucose Sensor (DEXCOM G7 SENSOR) MISC, Use one sensor every 10 days as instructed, Disp: 9 each, Rfl: 3   cyanocobalamin (VITAMIN B12) 1000 MCG tablet, Take 1,000 mcg by mouth daily., Disp: , Rfl:    escitalopram (LEXAPRO) 20 MG tablet, Take 20 mg by mouth daily., Disp: , Rfl:    famotidine (PEPCID) 20 MG tablet, Take 20 mg by mouth 2 (two) times daily., Disp: , Rfl:    insulin  aspart (NOVOLOG  FLEXPEN) 100 UNIT/ML FlexPen, Inject 20 Units into the skin 3 (three) times daily with meals., Disp: 30 mL, Rfl: 2   insulin  degludec (TRESIBA) 200 UNIT/ML FlexTouch Pen, Inject 30 Units into the skin daily., Disp: , Rfl:    Insulin  Pen Needle (PEN NEEDLES) 32G X 4 MM MISC, 1 each by Does not apply route., Disp: , Rfl:    metFORMIN  (GLUCOPHAGE ) 500 MG tablet, Take 2 tablets (1,000 mg total) by mouth 2 (two) times daily with a meal., Disp: 120 tablet, Rfl: 2   NIFEdipine  (PROCARDIA  XL/NIFEDICAL XL) 60 MG 24 hr tablet, Take 1 tablet (60 mg total) by mouth in the morning and at bedtime., Disp: 120 tablet, Rfl: 3   ondansetron  (ZOFRAN  ODT) 8 MG disintegrating tablet, 8mg  ODT q8 hours prn nausea, Disp: 10 tablet, Rfl: 0   Prenatal MV & Min w/FA-DHA (PRENATAL GUMMIES PO), Take 2  tablets by mouth daily at 6 (six) AM., Disp: , Rfl:   Medical History:  Past Medical History:  Diagnosis Date   Bipolar affective disorder (HCC)    Depression    Diabetes mellitus without complication (HCC)    Hypertension     Labs: OGTT: none Lab Results  Component Value Date   HGBA1C 6.8 (H) 01/01/2024  A1C: 10.3% obtained 09/25/2023 per chart  Dietary and Lifestyle History:  Pt presents today with her significant other for follow up. Pt reports she continues to work full time. Pt reports she discussed with her employer and now has an improved work schedule. Pt reports she has not been monitoring her blood sugar and states she has been depressed and overwhelmed. Pt reports she is feeling better today with less feelings of depression with her new work schedule. Pt reports she does not have her meter or CGM sensor with her today. RD provided a CGM sample sensor today and connected Pt to our clinic.  Pt reports she is taking 30 units of tresiba once daily near 6 pm daily. Pt denies missed doses of Tresiba. Pt reports a fear of low blood sugars and states admits to missed doses of novolog . Pt reports taking 10 units of novolog  with dinner last night stating a unknown pre meal blood sugar. Pt  denies blood sugars less than 70 mg/dL. Pt denies purchasing and carrying fast acting sugar source.  All Pt's questions were answered during this encounter  Physical Activity: walking, exercise ball   Stress: low self care read, draw Sleep: ok   24 hr Recall:  First Meal:  [employer] mc griddle SEC, hashbrown, 1/3 of pumpkin spice latter, water (no novolog )  Snack:  none or string cheese Second meal:  garden salad with dressing, cheese, ham, croutons, bacon, 1/2 flatbread with [place], cheese, cranberry, water-no novolog   (174 mg/dL 2 hour post prandial obtained in office today) Snack: yogurt with sunflower seeds Third meal: alfredo with noodles, 2 donuts, water (168 mg/dL, 2 hour post prandial per  Pt reports) o Snack: yogurt or string cheese or nuts  Beverages:  water, sprite zero, gatorade zero, diet cranberry juice, coffee with splenda and half and half   NUTRITION INTERVENTION  Nutrition education (E-1) on the following topics:   Initial Follow-up  [x]  []  Definition of Gestational Diabetes [x]  [x]  Why dietary management is important in controlling blood glucose [x]  []  Effects each nutrient has on blood glucose levels [x]  []  Simple carbohydrates vs complex carbohydrates [x]  [x]  Fluid intake [x]  [x]  Creating a balanced meal plan [x]  [x]  Carbohydrate counting  [x]  [x]  When to check blood glucose levels [x]  [x]  Proper blood glucose monitoring techniques [x]  []  Effect of stress and stress reduction techniques  [x]  [x]  Exercise effect on blood glucose levels, appropriate exercise during pregnancy [x]  []  Importance of limiting caffeine and abstaining from alcohol and smoking [x]  [x]  Medications used for blood sugar control during pregnancy [x]  [x]  Hypoglycemia and rule of 15 []  []  Postpartum self care  Patient has a meter prior to visit. Patient is instructed to begin testing pre meal and 2 hours after each meal. Pt was provided a Dexcom G7 sensor today.  Lot: [id number] Exp: 01/31/2025  Patient instructed to monitor glucose levels: QID FBS: 60 - <= 95 mg/dL; 2 hour: <= 879 mg/dL  Patient received handouts: Blood glucose log Snack ideas for diabetes during pregnancy Plate Planner Sodium free flavoring Tips  Meal Planning Tool   Patient will be seen for follow-up: 03/11/2024

## 2024-02-05 ENCOUNTER — Encounter: Attending: Family Medicine | Admitting: Dietician

## 2024-02-05 ENCOUNTER — Other Ambulatory Visit: Payer: Self-pay

## 2024-02-05 ENCOUNTER — Ambulatory Visit: Payer: Self-pay

## 2024-02-05 DIAGNOSIS — Z794 Long term (current) use of insulin: Secondary | ICD-10-CM | POA: Diagnosis not present

## 2024-02-05 DIAGNOSIS — E119 Type 2 diabetes mellitus without complications: Secondary | ICD-10-CM | POA: Insufficient documentation

## 2024-02-05 DIAGNOSIS — Z713 Dietary counseling and surveillance: Secondary | ICD-10-CM | POA: Diagnosis not present

## 2024-02-19 ENCOUNTER — Telehealth: Payer: Self-pay | Admitting: Family Medicine

## 2024-02-19 NOTE — Telephone Encounter (Signed)
 Called patient and left VM to see if patient can come in Tuesday afternoon for her OB appt instead of Monday.

## 2024-02-23 ENCOUNTER — Encounter: Admitting: Advanced Practice Midwife

## 2024-02-26 ENCOUNTER — Telehealth: Payer: Self-pay | Admitting: Family Medicine

## 2024-02-26 ENCOUNTER — Ambulatory Visit: Attending: Obstetrics and Gynecology | Admitting: Maternal & Fetal Medicine

## 2024-02-26 ENCOUNTER — Ambulatory Visit

## 2024-02-26 ENCOUNTER — Other Ambulatory Visit: Payer: Self-pay | Admitting: *Deleted

## 2024-02-26 ENCOUNTER — Other Ambulatory Visit: Payer: Self-pay | Admitting: Obstetrics

## 2024-02-26 VITALS — BP 167/88 | HR 75

## 2024-02-26 DIAGNOSIS — R030 Elevated blood-pressure reading, without diagnosis of hypertension: Secondary | ICD-10-CM

## 2024-02-26 DIAGNOSIS — Z362 Encounter for other antenatal screening follow-up: Secondary | ICD-10-CM | POA: Diagnosis not present

## 2024-02-26 DIAGNOSIS — E119 Type 2 diabetes mellitus without complications: Secondary | ICD-10-CM

## 2024-02-26 DIAGNOSIS — O26892 Other specified pregnancy related conditions, second trimester: Secondary | ICD-10-CM

## 2024-02-26 DIAGNOSIS — O365920 Maternal care for other known or suspected poor fetal growth, second trimester, not applicable or unspecified: Secondary | ICD-10-CM

## 2024-02-26 DIAGNOSIS — Z794 Long term (current) use of insulin: Secondary | ICD-10-CM

## 2024-02-26 DIAGNOSIS — O10012 Pre-existing essential hypertension complicating pregnancy, second trimester: Secondary | ICD-10-CM | POA: Diagnosis not present

## 2024-02-26 DIAGNOSIS — O99212 Obesity complicating pregnancy, second trimester: Secondary | ICD-10-CM | POA: Insufficient documentation

## 2024-02-26 DIAGNOSIS — Z3A26 26 weeks gestation of pregnancy: Secondary | ICD-10-CM

## 2024-02-26 DIAGNOSIS — O10919 Unspecified pre-existing hypertension complicating pregnancy, unspecified trimester: Secondary | ICD-10-CM

## 2024-02-26 DIAGNOSIS — E669 Obesity, unspecified: Secondary | ICD-10-CM | POA: Diagnosis not present

## 2024-02-26 DIAGNOSIS — O99342 Other mental disorders complicating pregnancy, second trimester: Secondary | ICD-10-CM

## 2024-02-26 DIAGNOSIS — O0990 Supervision of high risk pregnancy, unspecified, unspecified trimester: Secondary | ICD-10-CM

## 2024-02-26 DIAGNOSIS — O24112 Pre-existing diabetes mellitus, type 2, in pregnancy, second trimester: Secondary | ICD-10-CM

## 2024-02-26 DIAGNOSIS — F419 Anxiety disorder, unspecified: Secondary | ICD-10-CM

## 2024-02-26 MED ORDER — LABETALOL HCL 100 MG PO TABS: 100.0000 mg | ORAL_TABLET | Freq: Two times a day (BID) | ORAL | 0 refills | Status: DC

## 2024-02-26 NOTE — Telephone Encounter (Signed)
 RN reviewed pt chart with Dr. Zina after pt had two elevated blood pressure readings at her MFM appointment today.  Pt asymptomatic.  She denies headache, chest pain, visual disturbances.  She reports she does take her Procardia  60mg  daily.  Per Dr. Zina, pt advised to add Labetalol  100mg  BID to her daily meds and care plan will be reevaluated at her 03/08/24 [facility] appointment..  Labetalol  order sent to pt pharmacy.    Pt agreeable to this plan and verbalized understanding of Labetalol  prescription.      [place], RN

## 2024-02-26 NOTE — Progress Notes (Signed)
 MFM Consultation  Follow up growth due to elevated BMI, CHTN and T2DM.  Normal interval growth with measurements consistent with fetal growth restriction Good fetal movement and amniotic fluid volume  The UAD are normal without evidence of AEDF or REDF. Very difficult exam, suboptimal views of the fetal anatomy due to maternal habitus and fetal position.  Elevated blood pressure today 167/88 repeat 159/86 mmHg- she reports that she is asymptomatic. However, I discussed that she may need to add a second agent like Labetalol  to acheived a BP ~<140/90  She reports since she has added her dexcom she reports that her FBS are 80-90's and 120-130's 2hr PP. She has some abnormals but the dexcom helps her better understand how her blood sugars are responding to her food choices.   Again her ultrasound examination is challenging. She had a normal fetal echocardiogram on 9/4 see care everywhere.  We will continue serial growth exams every 4 weeks.  I spent 30 minutes with > 50% in face to face consultation.  Nathanel DOROTHA Fetters, MD

## 2024-02-26 NOTE — Telephone Encounter (Signed)
 Good afternoon,   This patient stopped by today because she has a visit on 10/6 but claims that her bp has been high and wants to know if she should come in sooner then her (next week) to get checked. She's expecting a call to confirm follow up steps.

## 2024-03-05 ENCOUNTER — Encounter: Payer: Self-pay | Admitting: Cardiology

## 2024-03-05 ENCOUNTER — Ambulatory Visit (INDEPENDENT_AMBULATORY_CARE_PROVIDER_SITE_OTHER): Admitting: Cardiology

## 2024-03-05 VITALS — BP 140/84 | HR 77 | Ht 64.0 in | Wt 328.0 lb

## 2024-03-05 DIAGNOSIS — Z79899 Other long term (current) drug therapy: Secondary | ICD-10-CM | POA: Diagnosis not present

## 2024-03-05 DIAGNOSIS — Z7689 Persons encountering health services in other specified circumstances: Secondary | ICD-10-CM | POA: Diagnosis not present

## 2024-03-05 DIAGNOSIS — I10 Essential (primary) hypertension: Secondary | ICD-10-CM

## 2024-03-05 DIAGNOSIS — R0789 Other chest pain: Secondary | ICD-10-CM

## 2024-03-05 MED ORDER — LABETALOL HCL 200 MG PO TABS: 100.0000 mg | ORAL_TABLET | Freq: Two times a day (BID) | ORAL | 3 refills | Status: DC

## 2024-03-05 MED ORDER — POTASSIUM CHLORIDE CRYS ER 20 MEQ PO TBCR: 20.0000 meq | EXTENDED_RELEASE_TABLET | Freq: Every day | ORAL | 0 refills | Status: DC

## 2024-03-05 MED ORDER — FUROSEMIDE 40 MG PO TABS
40.0000 mg | ORAL_TABLET | Freq: Every day | ORAL | 0 refills | Status: DC
Start: 2024-03-05 — End: 2024-03-16

## 2024-03-05 NOTE — Patient Instructions (Addendum)
 Medication Instructions:  Your physician has recommended you make the following change in your medication:  INCREASE: Labetalol  200 mg twice daily For 3 days: Lasix 40 mg once daily For 3 days: Potassium 20 mEq once daily  *If you need a refill on your cardiac medications before your next appointment, please call your pharmacy*  Lab Work: CMET, Mag at [address]  If you have labs (blood work) drawn today and your tests are completely normal, you will receive your results only by: [employer] (if you have MyChart) OR A paper copy in the mail If you have any lab test that is abnormal or we need to change your treatment, we will call you to review the results.  Testing/Procedures: Your physician has requested that you have an echocardiogram-OB. Echocardiography is a painless test that uses sound waves to create images of your heart. It provides your doctor with information about the size and shape of your heart and how well your heart's chambers and valves are working. This procedure takes approximately one hour. There are no restrictions for this procedure. Please do NOT wear cologne, perfume, aftershave, or lotions (deodorant is allowed). Please arrive 15 minutes prior to your appointment time.  Please note: We ask at that you not bring children with you during ultrasound (echo/ vascular) testing. Due to room size and safety concerns, children are not allowed in the ultrasound rooms during exams. Our front office staff cannot provide observation of children in our lobby area while testing is being conducted. An adult accompanying a patient to their appointment will only be allowed in the ultrasound room at the discretion of the ultrasound technician under special circumstances. We apologize for any inconvenience.   Follow-Up: At [facility], you and your health needs are our priority.  As part of our continuing mission to provide you with exceptional heart care, our  providers are all part of one team.  This team includes your primary Cardiologist (physician) and Advanced Practice Providers or APPs (Physician Assistants and Nurse Practitioners) who all work together to provide you with the care you need, when you need it.  Your next appointment:   6 week(s) Mag or [facility]   Provider:   Kardie Tobb, DO

## 2024-03-05 NOTE — Progress Notes (Signed)
 Cardio-Obstetrics Clinic  New Evaluation  Date:  03/06/2024   ID:  Summer Campbell, DOB 08/02/1990, MRN [id number]  PCP:  Claudene Pellet, MD   [facility] Providers Cardiologist:  Dub Huntsman, DO  Electrophysiologist:  None       Referring MD: Herchel Gloris LABOR, MD   Chief Complaint:   History of Present Illness:    Summer Campbell is a 33 y.o. female [G2P0010] who is being seen today for the evaluation of  hypertension  in pregnancy at the request of Anyanwu, Gloris LABOR, MD.   Medical history includes hypertension, Depression , Type 2 DM.   She was diagnosed with hypertension at age 42 and Type 2 DM at 20. This is her first pregnancy. She reports intermittent burning chest sensation which started recently with pregnancy. Comes and goes and improves with tums at times. Intermittent leg swelling.    Prior CV Studies Reviewed: The following studies were reviewed today: None today   Past Medical History:  Diagnosis Date   Bipolar affective disorder (HCC)    Depression    Diabetes mellitus without complication (HCC)    Hypertension     Past Surgical History:  Procedure Laterality Date   ADENOIDECTOMY     TONSILLECTOMY        OB History     Gravida  2   Para      Term      Preterm      AB  1   Living         SAB  1   IAB      Ectopic      Multiple      Live Births                  Current Medications: Current Meds  Medication Sig   ALPHA LIPOIC ACID PO Take 1 tablet by mouth daily at 6 (six) AM.   aspirin  EC 81 MG tablet Take 2 tablets (162 mg total) by mouth at bedtime. Start taking when you are [redacted] weeks pregnant for rest of pregnancy for prevention of preeclampsia   Calcium Carb-Cholecalciferol (CALCIUM 600+D3) 600-20 MG-MCG TABS Take 1 tablet by mouth daily at 6 (six) AM.   Cholecalciferol (VITAMIN D-3) 125 MCG (5000 UT) TABS Take 1 tablet by mouth daily at 6 (six) AM.   Continuous Glucose Sensor (DEXCOM G7 SENSOR) MISC Use one  sensor every 10 days as instructed   cyanocobalamin (VITAMIN B12) 1000 MCG tablet Take 1,000 mcg by mouth daily.   escitalopram (LEXAPRO) 20 MG tablet Take 20 mg by mouth daily.   famotidine (PEPCID) 20 MG tablet Take 20 mg by mouth 2 (two) times daily.   furosemide (LASIX) 40 MG tablet Take 1 tablet (40 mg total) by mouth daily for 3 days.   insulin  aspart (NOVOLOG  FLEXPEN) 100 UNIT/ML FlexPen Inject 20 Units into the skin 3 (three) times daily with meals.   insulin  degludec (TRESIBA) 200 UNIT/ML FlexTouch Pen Inject 30 Units into the skin daily.   Insulin  Pen Needle (PEN NEEDLES) 32G X 4 MM MISC 1 each by Does not apply route.   labetalol  (NORMODYNE ) 200 MG tablet Take 0.5 tablets (100 mg total) by mouth 2 (two) times daily.   metFORMIN  (GLUCOPHAGE ) 500 MG tablet Take 2 tablets (1,000 mg total) by mouth 2 (two) times daily with a meal.   NIFEdipine  (PROCARDIA  XL/NIFEDICAL XL) 60 MG 24 hr tablet Take 1 tablet (60 mg total) by mouth in  the morning and at bedtime.   ondansetron  (ZOFRAN  ODT) 8 MG disintegrating tablet 8mg  ODT q8 hours prn nausea   potassium chloride SA (KLOR-CON M20) 20 MEQ tablet Take 1 tablet (20 mEq total) by mouth daily for 3 days.   Prenatal MV & Min w/FA-DHA (PRENATAL GUMMIES PO) Take 2 tablets by mouth daily at 6 (six) AM.   [DISCONTINUED] labetalol  (NORMODYNE ) 100 MG tablet Take 1 tablet (100 mg total) by mouth 2 (two) times daily.     Allergies:   Patient has no known allergies.   Social History   Socioeconomic History   Marital status: Single    Spouse name: Not on file   Number of children: Not on file   Years of education: Not on file   Highest education level: Not on file  Occupational History   Not on file  Tobacco Use   Smoking status: Never   Smokeless tobacco: Never  Vaping Use   Vaping status: Never Used  Substance and Sexual Activity   Alcohol use: Not Currently    Comment: hx of alcohol abuse but stopped drinking heavily 2018-03-20, then socially    Drug use: Not Currently    Types: Marijuana    Comment: just after Dad passed away, not since 03/20/22   Sexual activity: Yes    Birth control/protection: None  Other Topics Concern   Not on file  Social History Narrative   Not on file   Social Drivers of Health   Financial Resource Strain: Medium Risk (10/14/2023)   Received from [facility]   Overall Financial Resource Strain (CARDIA)    Difficulty of Paying Living Expenses: Somewhat hard  Food Insecurity: No Food Insecurity (01/01/2024)   Hunger Vital Sign    Worried About Running Out of Food in the Last Year: Never true    Ran Out of Food in the Last Year: Never true  Recent Concern: Food Insecurity - Food Insecurity Present (10/14/2023)   Received from [facility]   Hunger Vital Sign    Within the past 12 months, you worried that your food would run out before you got the money to buy more.: Sometimes true    Within the past 12 months, the food you bought just didn't last and you didn't have money to get more.: Sometimes true  Transportation Needs: Unmet Transportation Needs (01/01/2024)   PRAPARE - Transportation    Lack of Transportation (Medical): Yes    Lack of Transportation (Non-Medical): Yes  Physical Activity: Sufficiently Active (10/14/2023)   Received from [facility]   Exercise Vital Sign    On average, how many days per week do you engage in moderate to strenuous exercise (like a brisk walk)?: 6 days    On average, how many minutes do you engage in exercise at this level?: 150+ min  Stress: Stress Concern Present (10/14/2023)   Received from [facility] - Occupational Stress Questionnaire    Feeling of Stress : Rather much  Social Connections: Socially Isolated (10/14/2023)   Received from [facility]   Social Network    How would you rate your social network (family, work, friends)?: Little participation, lonely and socially isolated      Family History  Problem  Relation Age of Onset   Drug abuse Mother    Alcohol abuse Mother    Depression Mother    Hypertension Mother    Diabetes Mother    Bipolar disorder Mother  Schizophrenia Mother    Pancreatitis Mother    Alcohol abuse Father    Stroke Father    Depression Father    Hypertension Father    Diabetes Father    Prostate cancer Father    Liver disease Father    Kidney disease Father    Suicidality Father       ROS:   Please see the history of present illness.     All other systems reviewed and are negative.   Labs/EKG Reviewed:    EKG:   EKG was ordered today.  The ekg ordered today demonstrates sinus rhythm, HR 77 bpm  Recent Labs: 01/01/2024: ALT 17; BUN 10; Creatinine, Ser 0.56; Hemoglobin 13.3; Platelets 316; Potassium 4.7; Sodium 137; TSH 1.670   Recent Lipid Panel No results found for: CHOL, TRIG, HDL, CHOLHDL, LDLCALC, LDLDIRECT  Physical Exam:    VS:  BP (!) 140/84 (BP Location: Left Arm, Patient Position: Sitting, Cuff Size: Large)   Pulse 77   Ht 5' 4 (1.626 m)   Wt (!) 328 lb (148.8 kg)   LMP  (LMP Unknown)   SpO2 99%   BMI 56.30 kg/m     Wt Readings from Last 3 Encounters:  03/05/24 (!) 328 lb (148.8 kg)  01/27/24 (!) 313 lb 8 oz (142.2 kg)  01/01/24 (!) 301 lb (136.5 kg)     GEN:  Well nourished, well developed in no acute distress HEENT: Normal NECK: No JVD; No carotid bruits LYMPHATICS: No lymphadenopathy CARDIAC: RRR, no murmurs, rubs, gallops RESPIRATORY:  Clear to auscultation without rales, wheezing or rhonchi  ABDOMEN: Soft, non-tender, non-distended MUSCULOSKELETAL:  No edema; No deformity  SKIN: Warm and dry NEUROLOGIC:  Alert and oriented x 3 PSYCHIATRIC:  Normal affect    Risk Assessment/Risk Calculators:     CARPREG II Risk Prediction Index Score:  1.  The patient's risk for a primary cardiac event is 5%.   Modified World Health Organization [facility]) Classification of Maternal CV Risk   Class I          ASSESSMENT & PLAN:    Chronic Hypertension in pregnancy [redacted] weeks gestation  Type 2 DM  Morbid obesity   Blood pressure elevated in the office today - will increase the Labetalol  to 200 mg BID. Continue current does of Nifedipine  60 mg twice a day.  Significant leg swelling +3 pitting edema - can benefit from a short course of lasix, Lasix 40 mg daily.  She will see our cardioOb pharmacist in 1-2 weeks follow with me in 6 weeks    Patient Instructions  Medication Instructions:  Your physician has recommended you make the following change in your medication:  INCREASE: Labetalol  200 mg twice daily For 3 days: Lasix 40 mg once daily For 3 days: Potassium 20 mEq once daily  *If you need a refill on your cardiac medications before your next appointment, please call your pharmacy*  Lab Work: CMET, Mag at [employer]  [address]  If you have labs (blood work) drawn today and your tests are completely normal, you will receive your results only by: [employer] (if you have MyChart) OR A paper copy in the mail If you have any lab test that is abnormal or we need to change your treatment, we will call you to review the results.  Testing/Procedures: Your physician has requested that you have an echocardiogram-OB. Echocardiography is a painless test that uses sound waves to create images of your heart. It provides your doctor with information  about the size and shape of your heart and how well your heart's chambers and valves are working. This procedure takes approximately one hour. There are no restrictions for this procedure. Please do NOT wear cologne, perfume, aftershave, or lotions (deodorant is allowed). Please arrive 15 minutes prior to your appointment time.  Please note: We ask at that you not bring children with you during ultrasound (echo/ vascular) testing. Due to room size and safety concerns, children are not allowed in the ultrasound rooms during exams. Our front office staff  cannot provide observation of children in our lobby area while testing is being conducted. An adult accompanying a patient to their appointment will only be allowed in the ultrasound room at the discretion of the ultrasound technician under special circumstances. We apologize for any inconvenience.   Follow-Up: At [facility], you and your health needs are our priority.  As part of our continuing mission to provide you with exceptional heart care, our providers are all part of one team.  This team includes your primary Cardiologist (physician) and Advanced Practice Providers or APPs (Physician Assistants and Nurse Practitioners) who all work together to provide you with the care you need, when you need it.  Your next appointment:   6 week(s) Mag or [facility]   Provider:   Shamecka Hocutt, DO     Dispo:  No follow-ups on file.   Medication Adjustments/Labs and Tests Ordered: Current medicines are reviewed at length with the patient today.  Concerns regarding medicines are outlined above.  Tests Ordered: Orders Placed This Encounter  Procedures   Magnesium   Comp Met (CMET)   AMB Referral to [facility] Pharm-[facility]   EKG 12-Lead   ECHOCARDIOGRAM COMPLETE   Medication Changes: Meds ordered this encounter  Medications   labetalol  (NORMODYNE ) 200 MG tablet    Sig: Take 0.5 tablets (100 mg total) by mouth 2 (two) times daily.    Dispense:  180 tablet    Refill:  3   furosemide (LASIX) 40 MG tablet    Sig: Take 1 tablet (40 mg total) by mouth daily for 3 days.    Dispense:  3 tablet    Refill:  0   potassium chloride SA (KLOR-CON M20) 20 MEQ tablet    Sig: Take 1 tablet (20 mEq total) by mouth daily for 3 days.    Dispense:  3 tablet    Refill:  0

## 2024-03-05 NOTE — Progress Notes (Deleted)
 Patient was seen for Pre-existing Diabetes During Pregnancy on *** Start time ***and End time ***  Estimated due date: 06/01/2024; [redacted]w[redacted]d  Clinical: Medications:  Current Outpatient Medications:    ALPHA LIPOIC ACID PO, Take 1 tablet by mouth daily at 6 (six) AM., Disp: , Rfl:    aspirin  EC 81 MG tablet, Take 2 tablets (162 mg total) by mouth at bedtime. Start taking when you are [redacted] weeks pregnant for rest of pregnancy for prevention of preeclampsia, Disp: 300 tablet, Rfl: 2   Calcium Carb-Cholecalciferol (CALCIUM 600+D3) 600-20 MG-MCG TABS, Take 1 tablet by mouth daily at 6 (six) AM., Disp: , Rfl:    Cholecalciferol (VITAMIN D-3) 125 MCG (5000 UT) TABS, Take 1 tablet by mouth daily at 6 (six) AM., Disp: , Rfl:    Continuous Glucose Sensor (DEXCOM G7 SENSOR) MISC, Use one sensor every 10 days as instructed, Disp: 9 each, Rfl: 3   cyanocobalamin (VITAMIN B12) 1000 MCG tablet, Take 1,000 mcg by mouth daily., Disp: , Rfl:    escitalopram (LEXAPRO) 20 MG tablet, Take 20 mg by mouth daily., Disp: , Rfl:    famotidine (PEPCID) 20 MG tablet, Take 20 mg by mouth 2 (two) times daily., Disp: , Rfl:    insulin  aspart (NOVOLOG  FLEXPEN) 100 UNIT/ML FlexPen, Inject 20 Units into the skin 3 (three) times daily with meals., Disp: 30 mL, Rfl: 2   insulin  degludec (TRESIBA) 200 UNIT/ML FlexTouch Pen, Inject 30 Units into the skin daily., Disp: , Rfl:    Insulin  Pen Needle (PEN NEEDLES) 32G X 4 MM MISC, 1 each by Does not apply route., Disp: , Rfl:    labetalol  (NORMODYNE ) 100 MG tablet, Take 1 tablet (100 mg total) by mouth 2 (two) times daily., Disp: 60 tablet, Rfl: 0   metFORMIN  (GLUCOPHAGE ) 500 MG tablet, Take 2 tablets (1,000 mg total) by mouth 2 (two) times daily with a meal., Disp: 120 tablet, Rfl: 2   NIFEdipine  (PROCARDIA  XL/NIFEDICAL XL) 60 MG 24 hr tablet, Take 1 tablet (60 mg total) by mouth in the morning and at bedtime., Disp: 120 tablet, Rfl: 3   ondansetron  (ZOFRAN  ODT) 8 MG disintegrating tablet,  8mg  ODT q8 hours prn nausea, Disp: 10 tablet, Rfl: 0   Prenatal MV & Min w/FA-DHA (PRENATAL GUMMIES PO), Take 2 tablets by mouth daily at 6 (six) AM., Disp: , Rfl:   Medical History:  Past Medical History:  Diagnosis Date   Bipolar affective disorder (HCC)    Depression    Diabetes mellitus without complication (HCC)    Hypertension     Labs: OGTT: none Lab Results  Component Value Date   HGBA1C 6.8 (H) 01/01/2024  A1C: 10.3% obtained 09/25/2023 per chart  Dietary and Lifestyle History:  02/05/2024: Pt presents today with her significant other for follow up. Pt reports she continues to work full time. Pt reports she discussed with her employer and now has an improved work schedule. Pt reports she has not been monitoring her blood sugar and states she has been depressed and overwhelmed.   Pt reports she is feeling better today with less feelings of depression with her new work schedule. Pt reports she does not have her meter or CGM sensor with her today. RD provided a CGM sample sensor today and connected Pt to our clinic.   Pt reports she is taking 30 units of tresiba once daily near 6 pm daily. Pt denies missed doses of Tresiba. Pt reports a fear of low blood sugars and  states admits to missed doses of novolog . Pt reports taking 10 units of novolog  with dinner last night stating a unknown pre meal blood sugar. Pt denies blood sugars less than 70 mg/dL. Pt denies purchasing and carrying fast acting sugar source.  All Pt's questions were answered during this encounter  Physical Activity: walking, exercise ball   Stress: low self care read, draw Sleep: ok   24 hr Recall:  First Meal:  [employer] mc griddle SEC, hashbrown, 1/3 of pumpkin spice latter, water (no novolog )  Snack:  none or string cheese Second meal:  garden salad with dressing, cheese, ham, croutons, bacon, 1/2 flatbread with [place], cheese, cranberry, water-no novolog   (174 mg/dL 2 hour post prandial obtained in office  today) Snack: yogurt with sunflower seeds Third meal: alfredo with noodles, 2 donuts, water (168 mg/dL, 2 hour post prandial per Pt reports) o Snack: yogurt or string cheese or nuts  Beverages:  water, sprite zero, gatorade zero, diet cranberry juice, coffee with splenda and half and half   NUTRITION INTERVENTION  Nutrition education (E-1) on the following topics:   Initial Follow-up  [x]  []  Definition of Gestational Diabetes [x]  [x]  Why dietary management is important in controlling blood glucose [x]  []  Effects each nutrient has on blood glucose levels [x]  []  Simple carbohydrates vs complex carbohydrates [x]  [x]  Fluid intake [x]  [x]  Creating a balanced meal plan [x]  [x]  Carbohydrate counting  [x]  [x]  When to check blood glucose levels [x]  [x]  Proper blood glucose monitoring techniques [x]  []  Effect of stress and stress reduction techniques  [x]  [x]  Exercise effect on blood glucose levels, appropriate exercise during pregnancy [x]  []  Importance of limiting caffeine and abstaining from alcohol and smoking [x]  [x]  Medications used for blood sugar control during pregnancy [x]  [x]  Hypoglycemia and rule of 15 []  []  Postpartum self care  Patient has a meter prior to visit. Patient is instructed to begin testing pre meal and 2 hours after each meal. Pt was provided a Dexcom G7 sensor today.  Lot: [id number] Exp: 01/31/2025  Patient instructed to monitor glucose levels: QID FBS: 60 - <= 95 mg/dL; 2 hour: <= 879 mg/dL  Patient received handouts: Blood glucose log Snack ideas for diabetes during pregnancy Plate Planner Sodium free flavoring Tips  Meal Planning Tool   Patient will be seen for follow-up: ***

## 2024-03-08 ENCOUNTER — Other Ambulatory Visit: Payer: Self-pay

## 2024-03-08 ENCOUNTER — Other Ambulatory Visit

## 2024-03-08 ENCOUNTER — Encounter: Payer: Self-pay | Admitting: Cardiology

## 2024-03-08 ENCOUNTER — Ambulatory Visit: Admitting: Family Medicine

## 2024-03-08 VITALS — BP 135/85 | HR 84 | Wt 326.0 lb

## 2024-03-08 DIAGNOSIS — O10912 Unspecified pre-existing hypertension complicating pregnancy, second trimester: Secondary | ICD-10-CM | POA: Diagnosis not present

## 2024-03-08 DIAGNOSIS — O368320 Maternal care for abnormalities of the fetal heart rate or rhythm, second trimester, not applicable or unspecified: Secondary | ICD-10-CM | POA: Diagnosis not present

## 2024-03-08 DIAGNOSIS — O10919 Unspecified pre-existing hypertension complicating pregnancy, unspecified trimester: Secondary | ICD-10-CM

## 2024-03-08 DIAGNOSIS — E119 Type 2 diabetes mellitus without complications: Secondary | ICD-10-CM | POA: Diagnosis not present

## 2024-03-08 DIAGNOSIS — Z23 Encounter for immunization: Secondary | ICD-10-CM | POA: Diagnosis not present

## 2024-03-08 DIAGNOSIS — Z794 Long term (current) use of insulin: Secondary | ICD-10-CM

## 2024-03-08 DIAGNOSIS — O0992 Supervision of high risk pregnancy, unspecified, second trimester: Secondary | ICD-10-CM

## 2024-03-08 DIAGNOSIS — Z3A27 27 weeks gestation of pregnancy: Secondary | ICD-10-CM

## 2024-03-08 DIAGNOSIS — O0990 Supervision of high risk pregnancy, unspecified, unspecified trimester: Secondary | ICD-10-CM

## 2024-03-08 DIAGNOSIS — O368390 Maternal care for abnormalities of the fetal heart rate or rhythm, unspecified trimester, not applicable or unspecified: Secondary | ICD-10-CM

## 2024-03-08 MED ORDER — LABETALOL HCL 200 MG PO TABS: 200.0000 mg | ORAL_TABLET | Freq: Two times a day (BID) | ORAL | 3 refills | Status: DC

## 2024-03-08 NOTE — Progress Notes (Signed)
 PRENATAL VISIT NOTE  Subjective:  Summer Campbell is a 33 y.o. G2P0010 at [redacted]w[redacted]d being seen today for ongoing prenatal care.  She is currently monitored for the following issues for this high-risk pregnancy and has Herpes simplex infection of perianal skin; Hyperlipidemia associated with type 2 diabetes mellitus (HCC); Hypertension associated with diabetes (HCC); Type 2 diabetes mellitus without complication, with long-term current use of insulin  (HCC); LGSIL on Pap smear of cervix; Supervision of high risk pregnancy, antepartum; Maternal morbid obesity, antepartum (HCC); Preexisting diabetes complicating pregnancy, antepartum; and Preexisting hypertension complicating pregnancy, antepartum on their problem list.  Patient reports no complaints.  Contractions: Not present. Vag. Bleeding: None.  Movement: Present. Denies leaking of fluid.   The following portions of the patient's history were reviewed and updated as appropriate: allergies, current medications, past family history, past medical history, past social history, past surgical history and problem list.   Objective:    Vitals:   03/08/24 1121  BP: 135/85  Pulse: 84  Weight: (!) 326 lb (147.9 kg)    Fetal Status:      Movement: Present    General: Alert, oriented and cooperative. Patient is in no acute distress.  Skin: Skin is warm and dry. No rash noted.   Cardiovascular: Normal heart rate noted  Respiratory: Normal respiratory effort, no problems with respiration noted  Abdomen: Soft, gravid, appropriate for gestational age.  Pain/Pressure: Absent     Pelvic: Cervical exam deferred        Extremities: Normal range of motion.  Edema: None  Mental Status: Normal mood and affect. Normal behavior. Normal judgment and thought content.   Assessment and Plan:  Pregnancy: G2P0010 at [redacted]w[redacted]d 1. [redacted] weeks gestation of pregnancy (Primary)   2. Preexisting hypertension complicating pregnancy, antepartum BP ok today On ASA On  Procardia  and Labetalol  EFW 7% - labetalol  (NORMODYNE ) 200 MG tablet; Take 1 tablet (200 mg total) by mouth 2 (two) times daily.  Dispense: 180 tablet; Refill: 3  3. Type 2 diabetes mellitus without complication, with long-term current use of insulin  (HCC) No log, reports CBGs are 75-87 fasting and usually 110's to 130 pp. Continue metformin  and insulin   4. Supervision of high risk pregnancy, antepartum 28 weeks labs today - CBC - HIV Antibody (routine testing w rflx) - RPR  5. Unable to hear fetal heart tones as reason for ultrasound scan + on u/s - US  OB Limited; Future  Preterm labor symptoms and general obstetric precautions including but not limited to vaginal bleeding, contractions, leaking of fluid and fetal movement were reviewed in detail with the patient. Please refer to After Visit Summary for other counseling recommendations.   No follow-ups on file.  Future Appointments  Date Time Provider Department Center  03/11/2024 10:15 AM [facility]  03/24/2024  1:35 PM Ilean Norleen GAILS, MD [facility]  03/25/2024 11:15 AM [facility] PROVIDER 1 [facility]  03/25/2024 11:30 AM [facility] US5 [facility]  03/26/2024  1:00 PM Pavero, Lonni, [facility] None  04/06/2024  3:15 PM Cleatus Moccasin, MD [facility]  04/15/2024  6:15 AM [facility]-ECHO 1 H[facility]-E[facility] H&V  04/16/2024  2:00 PM Tobb, Kardie, DO [facility] None  04/20/2024 10:55 AM Fredirick Glenys RAMAN, MD [facility]  04/22/2024 11:15 AM [facility] PROVIDER 1 [facility]  04/22/2024 11:30 AM [facility] US1 [facility]  05/04/2024 11:15 AM [facility]-GENERAL 1 [facility]  05/11/2024  1:15 PM [facility]GENERAL 1 [facility]  05/18/2024 11:15 AM [facility]GENERAL 1 [facility]  05/25/2024  4:15 PM [facility]GENERAL 1 [facility]  06/01/2024  1:15 PM [facility]GENERAL 1 [facility]    Glenys GORMAN Birk, MD

## 2024-03-09 ENCOUNTER — Ambulatory Visit: Payer: Self-pay | Admitting: Family Medicine

## 2024-03-09 ENCOUNTER — Telehealth: Payer: Self-pay

## 2024-03-09 LAB — RPR: RPR Ser Ql: NONREACTIVE

## 2024-03-09 LAB — CBC
Hematocrit: 38.2 % (ref 34.0–46.6)
Hemoglobin: 12.4 g/dL (ref 11.1–15.9)
MCH: 29.2 pg (ref 26.6–33.0)
MCHC: 32.5 g/dL (ref 31.5–35.7)
MCV: 90 fL (ref 79–97)
Platelets: 306 x10E3/uL (ref 150–450)
RBC: 4.24 x10E6/uL (ref 3.77–5.28)
RDW: 11.9 % (ref 11.7–15.4)
WBC: 15.8 x10E3/uL — ABNORMAL HIGH (ref 3.4–10.8)

## 2024-03-09 LAB — HIV ANTIBODY (ROUTINE TESTING W REFLEX): HIV Screen 4th Generation wRfx: NONREACTIVE

## 2024-03-09 NOTE — Telephone Encounter (Signed)
 Called pt to set up nurse visit. No answer, let pt know slots are available tomorrow 10/8 to be seen.  Pt needs to be set pt up with nurse visit to check her blood pressure machine when she calls back.

## 2024-03-11 ENCOUNTER — Other Ambulatory Visit: Payer: Self-pay

## 2024-03-11 ENCOUNTER — Ambulatory Visit

## 2024-03-11 DIAGNOSIS — O24112 Pre-existing diabetes mellitus, type 2, in pregnancy, second trimester: Secondary | ICD-10-CM

## 2024-03-12 ENCOUNTER — Ambulatory Visit

## 2024-03-16 ENCOUNTER — Ambulatory Visit: Attending: Cardiology | Admitting: *Deleted

## 2024-03-16 VITALS — BP 160/92 | HR 82 | Ht 64.0 in | Wt 334.0 lb

## 2024-03-16 DIAGNOSIS — I10 Essential (primary) hypertension: Secondary | ICD-10-CM

## 2024-03-16 MED ORDER — LABETALOL HCL 400 MG PO TABS: 400.0000 mg | ORAL_TABLET | Freq: Two times a day (BID) | ORAL | 3 refills | Status: DC

## 2024-03-16 MED ORDER — FUROSEMIDE 40 MG PO TABS: 40.0000 mg | ORAL_TABLET | Freq: Every day | ORAL | 0 refills | Status: DC

## 2024-03-16 MED ORDER — POTASSIUM CHLORIDE CRYS ER 20 MEQ PO TBCR: 20.0000 meq | EXTENDED_RELEASE_TABLET | Freq: Every day | ORAL | 0 refills | Status: DC

## 2024-03-16 NOTE — Progress Notes (Signed)
   Nurse Visit   Date of Encounter: 03/16/2024 ID: Summer Campbell, DOB [birth year], MRN [id number]  PCP:  Claudene Pellet, MD   [facility] Providers Cardiologist:  Dub Huntsman, DO      Visit Details   VS:  BP (!) 160/92 (BP Location: Left Arm, Patient Position: Sitting, Cuff Size: Large)   Pulse 82   Ht 5' 4 (1.626 m)   Wt (!) 334 lb (151.5 kg)   LMP  (LMP Unknown)   BMI 57.33 kg/m  , BMI Body mass index is 57.33 kg/m.  Wt Readings from Last 3 Encounters:  03/16/24 (!) 334 lb (151.5 kg)  03/08/24 (!) 326 lb (147.9 kg)  03/05/24 (!) 328 lb (148.8 kg)     Reason for visit: BP check Performed today: vital signs, Dr Huntsman consulted Changes (medications, testing, etc.) : Increase labetalol  to 400 mg twice daily Take furosemide 40 mg and potassium 20 meq by mouth daily for 3 days Length of Visit: 60 minutes  Patient brought her home BP cuff and reading was 176/106.  Patient reports this reading is similar to what she has been getting at home.  Appointment made for patient to see Dr Huntsman on October 21.  Also has follow up with PharmD on October 24.   Medications Adjustments/Labs and Tests Ordered: Orders Placed This Encounter  Procedures   Comp Met (CMET)   Magnesium   Meds ordered this encounter  Medications   labetalol  400 MG TABS    Sig: Take 400 mg by mouth 2 (two) times daily.    Dispense:  60 tablet    Refill:  3    Dose increase   furosemide (LASIX) 40 MG tablet    Sig: Take 1 tablet (40 mg total) by mouth daily for 3 days.    Dispense:  3 tablet    Refill:  0   potassium chloride SA (KLOR-CON M20) 20 MEQ tablet    Sig: Take 1 tablet (20 mEq total) by mouth daily for 3 days.    Dispense:  3 tablet    Refill:  0     Signed, Avelina Felix, RN  03/16/2024 4:04 PM

## 2024-03-16 NOTE — Patient Instructions (Addendum)
 Medication Instructions:  Your physician has recommended you make the following change in your medication:  Increase labetalol  to 400 mg by mouth twice daily. Take furosemide 40 mg and potassium 20 meq by mouth daily for 3 days.  *If you need a refill on your cardiac medications before your next appointment, please call your pharmacy*  Lab Work: Have lab work checked today in the lab on the first floor--CMP and Magnesium If you have labs (blood work) drawn today and your tests are completely normal, you will receive your results only by: MyChart Message (if you have MyChart) OR A paper copy in the mail If you have any lab test that is abnormal or we need to change your treatment, we will call you to review the results.  Testing/Procedures: none  Follow-Up: At [facility], you and your health needs are our priority.  As part of our continuing mission to provide you with exceptional heart care, our providers are all part of one team.  This team includes your primary Cardiologist (physician) and Advanced Practice Providers or APPs (Physician Assistants and Nurse Practitioners) who all work together to provide you with the care you need, when you need it.  Your next appointment:   October 21 at 3 PM  Provider:   Kardie Tobb, DO

## 2024-03-17 LAB — COMPREHENSIVE METABOLIC PANEL WITH GFR
ALT: 15 IU/L (ref 0–32)
AST: 14 IU/L (ref 0–40)
Albumin: 3 g/dL — ABNORMAL LOW (ref 3.9–4.9)
Alkaline Phosphatase: 75 IU/L (ref 41–116)
BUN/Creatinine Ratio: 26 — ABNORMAL HIGH (ref 9–23)
BUN: 16 mg/dL (ref 6–20)
Bilirubin Total: <0.2 mg/dL (ref 0.0–1.2)
CO2: 21 mmol/L (ref 20–29)
Calcium: 9.3 mg/dL (ref 8.7–10.2)
Chloride: 104 mmol/L (ref 96–106)
Creatinine, Ser: 0.62 mg/dL (ref 0.57–1.00)
Globulin, Total: 2.5 g/dL (ref 1.5–4.5)
Glucose: 161 mg/dL — ABNORMAL HIGH (ref 70–99)
Potassium: 4.7 mmol/L (ref 3.5–5.2)
Sodium: 137 mmol/L (ref 134–144)
Total Protein: 5.5 g/dL — ABNORMAL LOW (ref 6.0–8.5)
eGFR: 121 mL/min/1.73 (ref >59)

## 2024-03-17 LAB — MAGNESIUM: Magnesium: 1.8 mg/dL (ref 1.6–2.3)

## 2024-03-17 NOTE — Progress Notes (Deleted)
 Patient was seen for Pre-existing Diabetes During Pregnancy on *** Start time ***and End time ***  Estimated due date: 06/01/2024; [redacted]w[redacted]d  Clinical: Medications:  Current Outpatient Medications:    ALPHA LIPOIC ACID PO, Take 1 tablet by mouth daily at 6 (six) AM., Disp: , Rfl:    aspirin  EC 81 MG tablet, Take 2 tablets (162 mg total) by mouth at bedtime. Start taking when you are [redacted] weeks pregnant for rest of pregnancy for prevention of preeclampsia, Disp: 300 tablet, Rfl: 2   Calcium Carb-Cholecalciferol (CALCIUM 600+D3) 600-20 MG-MCG TABS, Take 1 tablet by mouth daily at 6 (six) AM., Disp: , Rfl:    Cholecalciferol (VITAMIN D-3) 125 MCG (5000 UT) TABS, Take 1 tablet by mouth daily at 6 (six) AM., Disp: , Rfl:    Continuous Glucose Sensor (DEXCOM G7 SENSOR) MISC, Use one sensor every 10 days as instructed, Disp: 9 each, Rfl: 3   cyanocobalamin (VITAMIN B12) 1000 MCG tablet, Take 1,000 mcg by mouth daily., Disp: , Rfl:    escitalopram (LEXAPRO) 20 MG tablet, Take 20 mg by mouth daily., Disp: , Rfl:    famotidine (PEPCID) 20 MG tablet, Take 20 mg by mouth 2 (two) times daily., Disp: , Rfl:    furosemide (LASIX) 40 MG tablet, Take 1 tablet (40 mg total) by mouth daily for 3 days., Disp: 3 tablet, Rfl: 0   insulin  aspart (NOVOLOG  FLEXPEN) 100 UNIT/ML FlexPen, Inject 20 Units into the skin 3 (three) times daily with meals., Disp: 30 mL, Rfl: 2   insulin  degludec (TRESIBA) 200 UNIT/ML FlexTouch Pen, Inject 30 Units into the skin daily., Disp: , Rfl:    Insulin  Pen Needle (PEN NEEDLES) 32G X 4 MM MISC, 1 each by Does not apply route., Disp: , Rfl:    labetalol  400 MG TABS, Take 400 mg by mouth 2 (two) times daily., Disp: 60 tablet, Rfl: 3   metFORMIN  (GLUCOPHAGE ) 500 MG tablet, Take 2 tablets (1,000 mg total) by mouth 2 (two) times daily with a meal., Disp: 120 tablet, Rfl: 2   NIFEdipine  (PROCARDIA  XL/NIFEDICAL XL) 60 MG 24 hr tablet, Take 1 tablet (60 mg total) by mouth in the morning and at  bedtime., Disp: 120 tablet, Rfl: 3   ondansetron  (ZOFRAN  ODT) 8 MG disintegrating tablet, 8mg  ODT q8 hours prn nausea, Disp: 10 tablet, Rfl: 0   potassium chloride SA (KLOR-CON M20) 20 MEQ tablet, Take 1 tablet (20 mEq total) by mouth daily for 3 days., Disp: 3 tablet, Rfl: 0   Prenatal MV & Min w/FA-DHA (PRENATAL GUMMIES PO), Take 2 tablets by mouth daily at 6 (six) AM., Disp: , Rfl:   Medical History:  Past Medical History:  Diagnosis Date   Bipolar affective disorder (HCC)    Depression    Diabetes mellitus without complication (HCC)    Hypertension     Labs: OGTT: none Lab Results  Component Value Date   HGBA1C 6.8 (H) 01/01/2024  A1C: 10.3% obtained 09/25/2023 per chart  Dietary and Lifestyle History:  02/05/2024: Pt presents today with her significant other for follow up. Pt reports she continues to work full time. Pt reports she discussed with her employer and now has an improved work schedule. Pt reports she has not been monitoring her blood sugar and states she has been depressed and overwhelmed.   Pt reports she is feeling better today with less feelings of depression with her new work schedule. Pt reports she does not have her meter or CGM sensor with  her today. RD provided a CGM sample sensor today and connected Pt to our clinic.   Pt reports she is taking 30 units of tresiba once daily near 6 pm daily. Pt denies missed doses of Tresiba. Pt reports a fear of low blood sugars and states admits to missed doses of novolog . Pt reports taking 10 units of novolog  with dinner last night stating a unknown pre meal blood sugar. Pt denies blood sugars less than 70 mg/dL. Pt denies purchasing and carrying fast acting sugar source.  All Pt's questions were answered during this encounter  Physical Activity: walking, exercise ball   Stress: low self care read, draw Sleep: ok   24 hr Recall:  First Meal:  [employer] mc griddle SEC, hashbrown, 1/3 of pumpkin spice latter, water (no  novolog )  Snack:  none or string cheese Second meal:  garden salad with dressing, cheese, ham, croutons, bacon, 1/2 flatbread with [place], cheese, cranberry, water-no novolog   (174 mg/dL 2 hour post prandial obtained in office today) Snack: yogurt with sunflower seeds Third meal: alfredo with noodles, 2 donuts, water (168 mg/dL, 2 hour post prandial per Pt reports) o Snack: yogurt or string cheese or nuts  Beverages:  water, sprite zero, gatorade zero, diet cranberry juice, coffee with splenda and half and half   NUTRITION INTERVENTION  Nutrition education (E-1) on the following topics:   Initial Follow-up  [x]  []  Definition of Gestational Diabetes [x]  [x]  Why dietary management is important in controlling blood glucose [x]  []  Effects each nutrient has on blood glucose levels [x]  []  Simple carbohydrates vs complex carbohydrates [x]  [x]  Fluid intake [x]  [x]  Creating a balanced meal plan [x]  [x]  Carbohydrate counting  [x]  [x]  When to check blood glucose levels [x]  [x]  Proper blood glucose monitoring techniques [x]  []  Effect of stress and stress reduction techniques  [x]  [x]  Exercise effect on blood glucose levels, appropriate exercise during pregnancy [x]  []  Importance of limiting caffeine and abstaining from alcohol and smoking [x]  [x]  Medications used for blood sugar control during pregnancy [x]  [x]  Hypoglycemia and rule of 15 []  []  Postpartum self care  Patient has a meter prior to visit. Patient is instructed to begin testing pre meal and 2 hours after each meal. Pt was provided a Dexcom G7 sensor today.  Lot: [id number] Exp: 01/31/2025  Patient instructed to monitor glucose levels: QID FBS: 60 - <= 95 mg/dL; 2 hour: <= 879 mg/dL  Patient received handouts: Blood glucose log Snack ideas for diabetes during pregnancy Plate Planner Sodium free flavoring Tips  Meal Planning Tool   Patient will be seen for follow-up: ***

## 2024-03-18 ENCOUNTER — Encounter: Payer: Self-pay | Admitting: Obstetrics & Gynecology

## 2024-03-18 ENCOUNTER — Ambulatory Visit

## 2024-03-18 ENCOUNTER — Other Ambulatory Visit (HOSPITAL_COMMUNITY)

## 2024-03-23 ENCOUNTER — Ambulatory Visit: Attending: Cardiology | Admitting: Cardiology

## 2024-03-23 ENCOUNTER — Encounter: Payer: Self-pay | Admitting: Cardiology

## 2024-03-23 ENCOUNTER — Ambulatory Visit: Payer: Self-pay | Admitting: Cardiology

## 2024-03-23 ENCOUNTER — Other Ambulatory Visit: Payer: Self-pay | Admitting: Cardiology

## 2024-03-23 VITALS — BP 160/92 | HR 68 | Ht 64.0 in | Wt 335.2 lb

## 2024-03-23 DIAGNOSIS — Z3A30 30 weeks gestation of pregnancy: Secondary | ICD-10-CM

## 2024-03-23 DIAGNOSIS — Z794 Long term (current) use of insulin: Secondary | ICD-10-CM

## 2024-03-23 DIAGNOSIS — E119 Type 2 diabetes mellitus without complications: Secondary | ICD-10-CM

## 2024-03-23 DIAGNOSIS — E1159 Type 2 diabetes mellitus with other circulatory complications: Secondary | ICD-10-CM | POA: Diagnosis not present

## 2024-03-23 DIAGNOSIS — Z79899 Other long term (current) drug therapy: Secondary | ICD-10-CM

## 2024-03-23 DIAGNOSIS — O10913 Unspecified pre-existing hypertension complicating pregnancy, third trimester: Secondary | ICD-10-CM | POA: Diagnosis not present

## 2024-03-23 DIAGNOSIS — I152 Hypertension secondary to endocrine disorders: Secondary | ICD-10-CM

## 2024-03-23 DIAGNOSIS — O99210 Obesity complicating pregnancy, unspecified trimester: Secondary | ICD-10-CM | POA: Diagnosis not present

## 2024-03-23 MED ORDER — LABETALOL HCL 400 MG PO TABS: 400.0000 mg | ORAL_TABLET | Freq: Three times a day (TID) | ORAL | 2 refills | Status: DC

## 2024-03-23 MED ORDER — POTASSIUM CHLORIDE CRYS ER 20 MEQ PO TBCR: 20.0000 meq | EXTENDED_RELEASE_TABLET | Freq: Every day | ORAL | 1 refills | Status: DC

## 2024-03-23 MED ORDER — FUROSEMIDE 40 MG PO TABS: 40.0000 mg | ORAL_TABLET | Freq: Every day | ORAL | 0 refills | Status: DC

## 2024-03-23 NOTE — Progress Notes (Signed)
 Cardio-Obstetrics Clinic  Follow Up Note   Date:  03/23/2024   ID:  Summer Campbell, DOB 1990/07/15, MRN [id number]  PCP:  Claudene Pellet, MD   [facility] Providers Cardiologist:  Dub Huntsman, DO  Electrophysiologist:  None        Referring MD: Claudene Pellet, MD   Chief Complaint:  I am having swollen legs  History of Present Illness:    Summer Campbell is a 33 y.o. female [G2P0010] who returns for follow up of hypertensive disorder in the pregnancy, obesity, diabetes mellitus  Since the last time I saw the patient she presented for nurse visit blood pressure was elevated at that time increase her labetalol  to 400 twice a day.  Give some Lasix for couple days 3 days to be exact.  She tells me that when she ran out of her Lasix she had significant leg swelling  Prior CV Studies Reviewed: The following studies were reviewed today:   Past Medical History:  Diagnosis Date   Bipolar affective disorder (HCC)    Depression    Diabetes mellitus without complication (HCC)    Hypertension     Past Surgical History:  Procedure Laterality Date   ADENOIDECTOMY     TONSILLECTOMY        OB History     Gravida  2   Para      Term      Preterm      AB  1   Living         SAB  1   IAB      Ectopic      Multiple      Live Births                  Current Medications: Current Meds  Medication Sig   ALPHA LIPOIC ACID PO Take 1 tablet by mouth daily at 6 (six) AM.   aspirin  EC 81 MG tablet Take 2 tablets (162 mg total) by mouth at bedtime. Start taking when you are [redacted] weeks pregnant for rest of pregnancy for prevention of preeclampsia   Calcium Carb-Cholecalciferol (CALCIUM 600+D3) 600-20 MG-MCG TABS Take 1 tablet by mouth daily at 6 (six) AM.   Cholecalciferol (VITAMIN D-3) 125 MCG (5000 UT) TABS Take 1 tablet by mouth daily at 6 (six) AM.   Continuous Glucose Sensor (DEXCOM G7 SENSOR) MISC Use one sensor every 10 days as instructed    cyanocobalamin (VITAMIN B12) 1000 MCG tablet Take 1,000 mcg by mouth daily.   escitalopram (LEXAPRO) 20 MG tablet Take 20 mg by mouth daily.   famotidine (PEPCID) 20 MG tablet Take 20 mg by mouth 2 (two) times daily.   furosemide (LASIX) 40 MG tablet Take 1 tablet (40 mg total) by mouth daily.   insulin  aspart (NOVOLOG  FLEXPEN) 100 UNIT/ML FlexPen Inject 20 Units into the skin 3 (three) times daily with meals.   insulin  degludec (TRESIBA) 200 UNIT/ML FlexTouch Pen Inject 30 Units into the skin daily.   Insulin  Pen Needle (PEN NEEDLES) 32G X 4 MM MISC 1 each by Does not apply route.   metFORMIN  (GLUCOPHAGE ) 500 MG tablet Take 2 tablets (1,000 mg total) by mouth 2 (two) times daily with a meal.   NIFEdipine  (PROCARDIA  XL/NIFEDICAL XL) 60 MG 24 hr tablet Take 1 tablet (60 mg total) by mouth in the morning and at bedtime.   ondansetron  (ZOFRAN  ODT) 8 MG disintegrating tablet 8mg  ODT q8 hours prn nausea   potassium chloride  SA (KLOR-CON M20) 20 MEQ tablet Take 1 tablet (20 mEq total) by mouth daily.   Prenatal MV & Min w/FA-DHA (PRENATAL GUMMIES PO) Take 2 tablets by mouth daily at 6 (six) AM.   [DISCONTINUED] furosemide (LASIX) 40 MG tablet Take 1 tablet (40 mg total) by mouth daily for 3 days.   [DISCONTINUED] labetalol  400 MG TABS Take 400 mg by mouth 2 (two) times daily.   [DISCONTINUED] potassium chloride SA (KLOR-CON M20) 20 MEQ tablet Take 1 tablet (20 mEq total) by mouth daily for 3 days.     Allergies:   Patient has no known allergies.   Social History   Socioeconomic History   Marital status: Single    Spouse name: Not on file   Number of children: Not on file   Years of education: Not on file   Highest education level: Not on file  Occupational History   Not on file  Tobacco Use   Smoking status: Never   Smokeless tobacco: Never  Vaping Use   Vaping status: Never Used  Substance and Sexual Activity   Alcohol use: Not Currently    Comment: hx of alcohol abuse but stopped  drinking heavily Mar 30, 2018, then socially   Drug use: Not Currently    Types: Marijuana    Comment: just after Dad passed away, not since 30-Mar-2022   Sexual activity: Yes    Birth control/protection: None  Other Topics Concern   Not on file  Social History Narrative   Not on file   Social Drivers of Health   Financial Resource Strain: Medium Risk (10/14/2023)   Received from [facility]   Overall Financial Resource Strain (CARDIA)    Difficulty of Paying Living Expenses: Somewhat hard  Food Insecurity: No Food Insecurity (01/01/2024)   Hunger Vital Sign    Worried About Running Out of Food in the Last Year: Never true    Ran Out of Food in the Last Year: Never true  Recent Concern: Food Insecurity - Food Insecurity Present (10/14/2023)   Received from [facility]   Hunger Vital Sign    Within the past 12 months, you worried that your food would run out before you got the money to buy more.: Sometimes true    Within the past 12 months, the food you bought just didn't last and you didn't have money to get more.: Sometimes true  Transportation Needs: Unmet Transportation Needs (01/01/2024)   PRAPARE - Transportation    Lack of Transportation (Medical): Yes    Lack of Transportation (Non-Medical): Yes  Physical Activity: Sufficiently Active (10/14/2023)   Received from [facility]   Exercise Vital Sign    On average, how many days per week do you engage in moderate to strenuous exercise (like a brisk walk)?: 6 days    On average, how many minutes do you engage in exercise at this level?: 150+ min  Stress: Stress Concern Present (10/14/2023)   Received from [facility] - Occupational Stress Questionnaire    Feeling of Stress : Rather much  Social Connections: Socially Isolated (10/14/2023)   Received from [facility]   Social Network    How would you rate your social network (family, work, friends)?: Little participation, lonely and socially  isolated      Family History  Problem Relation Age of Onset   Drug abuse Mother    Alcohol abuse Mother    Depression Mother    Hypertension Mother  Diabetes Mother    Bipolar disorder Mother    Schizophrenia Mother    Pancreatitis Mother    Alcohol abuse Father    Stroke Father    Depression Father    Hypertension Father    Diabetes Father    Prostate cancer Father    Liver disease Father    Kidney disease Father    Suicidality Father       ROS:   Please see the history of present illness.    Leg swelling All other systems reviewed and are negative.   Labs/EKG Reviewed:    EKG:  None today   Recent Labs: 01/01/2024: TSH 1.670 03/08/2024: Hemoglobin 12.4; Platelets 306 03/16/2024: ALT 15; BUN 16; Creatinine, Ser 0.62; Magnesium 1.8; Potassium 4.7; Sodium 137   Recent Lipid Panel No results found for: CHOL, TRIG, HDL, CHOLHDL, LDLCALC, LDLDIRECT  Physical Exam:    VS:  BP (!) 160/92   Pulse 68   Ht 5' 4 (1.626 m)   Wt (!) 335 lb 3.2 oz (152 kg)   LMP  (LMP Unknown)   SpO2 99%   BMI 57.54 kg/m     Wt Readings from Last 3 Encounters:  03/23/24 (!) 335 lb 3.2 oz (152 kg)  03/16/24 (!) 334 lb (151.5 kg)  03/08/24 (!) 326 lb (147.9 kg)     GEN:  Well nourished, well developed in no acute distress HEENT: Normal NECK: No JVD; No carotid bruits LYMPHATICS: No lymphadenopathy CARDIAC: RRR, no murmurs, rubs, gallops RESPIRATORY:  Clear to auscultation without rales, wheezing or rhonchi  ABDOMEN: Soft, non-tender, non-distended MUSCULOSKELETAL:  No edema; No deformity  SKIN: Warm and dry NEUROLOGIC:  Alert and oriented x 3 PSYCHIATRIC:  Normal affect    Risk Assessment/Risk Calculators:     CARPREG II Risk Prediction Index Score:  1.  The patient's risk for a primary cardiac event is 5%.   Modified World Health Organization [facility]) Classification of Maternal CV Risk   Class I         ASSESSMENT & PLAN:    Hypertension in  pregnancy. Diabetes mellitus Morbid obesity  Blood pressure is elevated in the office today.  Going to increase her labetalol  to 400 mg 3 times daily.  Will start her back on Lasix 40 mg daily.  She has a visit with Chris Pavero, PharmD in a couple days we will see if this is working.  Will consider adding hydralazine if her blood pressure remains elevated.  We will get blood work today to assess her electrolytes as well as kidney function.  She has a follow-up visit with me on October 31 at 2:20 PM.   Patient Instructions  Medication Instructions:  Your physician has recommended you make the following change in your medication:  INCREASE: Labetalol  400 mg three times daily  START: Lasix 40 mg once daily START: Potassium 20 mEq once daily  *If you need a refill on your cardiac medications before your next appointment, please call your pharmacy*  Lab Work: CMET, Mag If you have labs (blood work) drawn today and your tests are completely normal, you will receive your results only by: MyChart Message (if you have MyChart) OR A paper copy in the mail If you have any lab test that is abnormal or we need to change your treatment, we will call you to review the results.  Follow-Up: At [facility], you and your health needs are our priority.  As part of our continuing mission to provide you  with exceptional heart care, our providers are all part of one team.  This team includes your primary Cardiologist (physician) and Advanced Practice Providers or APPs (Physician Assistants and Nurse Practitioners) who all work together to provide you with the care you need, when you need it.  Your next appointment:    October 31st at 2:20pm [facility])  Provider:   Tanasha Menees, DO     Dispo:  No follow-ups on file.   Medication Adjustments/Labs and Tests Ordered: Current medicines are reviewed at length with the patient today.  Concerns regarding medicines are outlined above.   Tests Ordered: Orders Placed This Encounter  Procedures   Comp Met (CMET)   Magnesium   Medication Changes: Meds ordered this encounter  Medications   potassium chloride SA (KLOR-CON M20) 20 MEQ tablet    Sig: Take 1 tablet (20 mEq total) by mouth daily.    Dispense:  10 tablet    Refill:  1   furosemide (LASIX) 40 MG tablet    Sig: Take 1 tablet (40 mg total) by mouth daily.    Dispense:  10 tablet    Refill:  0   Labetalol  HCl 400 MG TABS    Sig: Take 400 mg by mouth 3 (three) times daily.    Dispense:  30 tablet    Refill:  2    Dose increase

## 2024-03-23 NOTE — Patient Instructions (Addendum)
 Medication Instructions:  Your physician has recommended you make the following change in your medication:  INCREASE: Labetalol  400 mg three times daily  START: Lasix 40 mg once daily START: Potassium 20 mEq once daily  *If you need a refill on your cardiac medications before your next appointment, please call your pharmacy*  Lab Work: CMET, Mag If you have labs (blood work) drawn today and your tests are completely normal, you will receive your results only by: MyChart Message (if you have MyChart) OR A paper copy in the mail If you have any lab test that is abnormal or we need to change your treatment, we will call you to review the results.  Follow-Up: At [facility], you and your health needs are our priority.  As part of our continuing mission to provide you with exceptional heart care, our providers are all part of one team.  This team includes your primary Cardiologist (physician) and Advanced Practice Providers or APPs (Physician Assistants and Nurse Practitioners) who all work together to provide you with the care you need, when you need it.  Your next appointment:    October 31st at 2:20pm [facility])  Provider:   Kardie Tobb, DO

## 2024-03-24 ENCOUNTER — Ambulatory Visit (INDEPENDENT_AMBULATORY_CARE_PROVIDER_SITE_OTHER): Admitting: Family Medicine

## 2024-03-24 ENCOUNTER — Inpatient Hospital Stay (HOSPITAL_COMMUNITY)
Admission: AD | Admit: 2024-03-24 | Discharge: 2024-04-18 | DRG: 787 | Disposition: A | Discharge status: Home / Self Care | Attending: Obstetrics and Gynecology | Admitting: Obstetrics and Gynecology

## 2024-03-24 ENCOUNTER — Encounter (HOSPITAL_COMMUNITY): Payer: Self-pay | Admitting: Obstetrics & Gynecology

## 2024-03-24 ENCOUNTER — Other Ambulatory Visit: Payer: Self-pay

## 2024-03-24 VITALS — BP 172/98 | HR 91 | Wt 324.0 lb

## 2024-03-24 DIAGNOSIS — Z8249 Family history of ischemic heart disease and other diseases of the circulatory system: Secondary | ICD-10-CM

## 2024-03-24 DIAGNOSIS — O365930 Maternal care for other known or suspected poor fetal growth, third trimester, not applicable or unspecified: Secondary | ICD-10-CM | POA: Diagnosis present

## 2024-03-24 DIAGNOSIS — O10919 Unspecified pre-existing hypertension complicating pregnancy, unspecified trimester: Secondary | ICD-10-CM | POA: Diagnosis present

## 2024-03-24 DIAGNOSIS — O9832 Other infections with a predominantly sexual mode of transmission complicating childbirth: Secondary | ICD-10-CM | POA: Diagnosis present

## 2024-03-24 DIAGNOSIS — O0990 Supervision of high risk pregnancy, unspecified, unspecified trimester: Secondary | ICD-10-CM

## 2024-03-24 DIAGNOSIS — O113 Pre-existing hypertension with pre-eclampsia, third trimester: Secondary | ICD-10-CM | POA: Diagnosis not present

## 2024-03-24 DIAGNOSIS — O365990 Maternal care for other known or suspected poor fetal growth, unspecified trimester, not applicable or unspecified: Secondary | ICD-10-CM | POA: Diagnosis present

## 2024-03-24 DIAGNOSIS — Z79899 Other long term (current) drug therapy: Secondary | ICD-10-CM | POA: Diagnosis not present

## 2024-03-24 DIAGNOSIS — O119 Pre-existing hypertension with pre-eclampsia, unspecified trimester: Principal | ICD-10-CM | POA: Diagnosis present

## 2024-03-24 DIAGNOSIS — O24319 Unspecified pre-existing diabetes mellitus in pregnancy, unspecified trimester: Secondary | ICD-10-CM | POA: Diagnosis present

## 2024-03-24 DIAGNOSIS — Z7984 Long term (current) use of oral hypoglycemic drugs: Secondary | ICD-10-CM

## 2024-03-24 DIAGNOSIS — Z6841 Body Mass Index (BMI) 40.0 and over, adult: Secondary | ICD-10-CM

## 2024-03-24 DIAGNOSIS — O99213 Obesity complicating pregnancy, third trimester: Secondary | ICD-10-CM

## 2024-03-24 DIAGNOSIS — Z3A30 30 weeks gestation of pregnancy: Secondary | ICD-10-CM

## 2024-03-24 DIAGNOSIS — Z794 Long term (current) use of insulin: Secondary | ICD-10-CM

## 2024-03-24 DIAGNOSIS — O2412 Pre-existing diabetes mellitus, type 2, in childbirth: Secondary | ICD-10-CM | POA: Diagnosis present

## 2024-03-24 DIAGNOSIS — F419 Anxiety disorder, unspecified: Secondary | ICD-10-CM | POA: Diagnosis not present

## 2024-03-24 DIAGNOSIS — O289 Unspecified abnormal findings on antenatal screening of mother: Secondary | ICD-10-CM | POA: Diagnosis not present

## 2024-03-24 DIAGNOSIS — O10913 Unspecified pre-existing hypertension complicating pregnancy, third trimester: Secondary | ICD-10-CM

## 2024-03-24 DIAGNOSIS — O0993 Supervision of high risk pregnancy, unspecified, third trimester: Secondary | ICD-10-CM | POA: Diagnosis not present

## 2024-03-24 DIAGNOSIS — A6000 Herpesviral infection of urogenital system, unspecified: Secondary | ICD-10-CM | POA: Diagnosis present

## 2024-03-24 DIAGNOSIS — Z833 Family history of diabetes mellitus: Secondary | ICD-10-CM | POA: Diagnosis not present

## 2024-03-24 DIAGNOSIS — O1413 Severe pre-eclampsia, third trimester: Secondary | ICD-10-CM | POA: Diagnosis not present

## 2024-03-24 DIAGNOSIS — O1092 Unspecified pre-existing hypertension complicating childbirth: Secondary | ICD-10-CM | POA: Diagnosis present

## 2024-03-24 DIAGNOSIS — O99342 Other mental disorders complicating pregnancy, second trimester: Secondary | ICD-10-CM | POA: Diagnosis not present

## 2024-03-24 DIAGNOSIS — E119 Type 2 diabetes mellitus without complications: Secondary | ICD-10-CM

## 2024-03-24 DIAGNOSIS — O99214 Obesity complicating childbirth: Secondary | ICD-10-CM | POA: Diagnosis present

## 2024-03-24 DIAGNOSIS — Z3A32 32 weeks gestation of pregnancy: Secondary | ICD-10-CM | POA: Diagnosis not present

## 2024-03-24 DIAGNOSIS — O34211 Maternal care for low transverse scar from previous cesarean delivery: Secondary | ICD-10-CM | POA: Diagnosis present

## 2024-03-24 DIAGNOSIS — O10013 Pre-existing essential hypertension complicating pregnancy, third trimester: Secondary | ICD-10-CM | POA: Diagnosis not present

## 2024-03-24 DIAGNOSIS — Z3A33 33 weeks gestation of pregnancy: Secondary | ICD-10-CM | POA: Diagnosis not present

## 2024-03-24 DIAGNOSIS — O114 Pre-existing hypertension with pre-eclampsia, complicating childbirth: Secondary | ICD-10-CM | POA: Diagnosis present

## 2024-03-24 DIAGNOSIS — O24112 Pre-existing diabetes mellitus, type 2, in pregnancy, second trimester: Secondary | ICD-10-CM | POA: Diagnosis not present

## 2024-03-24 DIAGNOSIS — O24113 Pre-existing diabetes mellitus, type 2, in pregnancy, third trimester: Secondary | ICD-10-CM | POA: Diagnosis not present

## 2024-03-24 DIAGNOSIS — O1410 Severe pre-eclampsia, unspecified trimester: Secondary | ICD-10-CM | POA: Diagnosis present

## 2024-03-24 DIAGNOSIS — Z98891 History of uterine scar from previous surgery: Secondary | ICD-10-CM

## 2024-03-24 DIAGNOSIS — E11649 Type 2 diabetes mellitus with hypoglycemia without coma: Secondary | ICD-10-CM | POA: Diagnosis present

## 2024-03-24 DIAGNOSIS — Z3A31 31 weeks gestation of pregnancy: Secondary | ICD-10-CM | POA: Diagnosis not present

## 2024-03-24 DIAGNOSIS — A601 Herpesviral infection of perianal skin and rectum: Secondary | ICD-10-CM | POA: Diagnosis present

## 2024-03-24 HISTORY — DX: Anxiety disorder, unspecified: F41.9

## 2024-03-24 LAB — COMPREHENSIVE METABOLIC PANEL WITH GFR
ALT: 19 IU/L (ref 0–32)
ALT: 21 U/L (ref 0–44)
AST: 16 IU/L (ref 0–40)
AST: 20 U/L (ref 15–41)
Albumin: 3.3 g/dL — ABNORMAL LOW (ref 3.9–4.9)
Albumin: 2.4 g/dL — ABNORMAL LOW (ref 3.5–5.0)
Alkaline Phosphatase: 75 IU/L (ref 41–116)
Alkaline Phosphatase: 52 U/L (ref 38–126)
Anion gap: 11 (ref 5–15)
BUN/Creatinine Ratio: 27 — ABNORMAL HIGH (ref 9–23)
BUN: 17 mg/dL (ref 6–20)
BUN: 15 mg/dL (ref 6–20)
Bilirubin Total: <0.2 mg/dL (ref 0.0–1.2)
CO2: 22 mmol/L (ref 20–29)
CO2: 22 mmol/L (ref 22–32)
Calcium: 9.9 mg/dL (ref 8.7–10.2)
Calcium: 8.8 mg/dL — ABNORMAL LOW (ref 8.9–10.3)
Chloride: 102 mmol/L (ref 96–106)
Chloride: 104 mmol/L (ref 98–111)
Creatinine, Ser: 0.62 mg/dL (ref 0.57–1.00)
Creatinine, Ser: 0.63 mg/dL (ref 0.44–1.00)
GFR, Estimated: >60 mL/min (ref >60)
Globulin, Total: 2.8 g/dL (ref 1.5–4.5)
Glucose, Bld: 121 mg/dL — ABNORMAL HIGH (ref 70–99)
Glucose: 100 mg/dL — ABNORMAL HIGH (ref 70–99)
Potassium: 4.8 mmol/L (ref 3.5–5.2)
Potassium: 4.3 mmol/L (ref 3.5–5.1)
Sodium: 138 mmol/L (ref 134–144)
Sodium: 137 mmol/L (ref 135–145)
Total Bilirubin: 0.2 mg/dL (ref 0.0–1.2)
Total Protein: 6.1 g/dL (ref 6.0–8.5)
Total Protein: 5.8 g/dL — ABNORMAL LOW (ref 6.5–8.1)
eGFR: 121 mL/min/1.73 (ref >59)

## 2024-03-24 LAB — CBC
HCT: 35.6 % — ABNORMAL LOW (ref 36.0–46.0)
Hemoglobin: 11.7 g/dL — ABNORMAL LOW (ref 12.0–15.0)
MCH: 28.9 pg (ref 26.0–34.0)
MCHC: 32.9 g/dL (ref 30.0–36.0)
MCV: 87.9 fL (ref 80.0–100.0)
Platelets: 268 K/uL (ref 150–400)
RBC: 4.05 MIL/uL (ref 3.87–5.11)
RDW: 12.6 % (ref 11.5–15.5)
WBC: 12.1 K/uL — ABNORMAL HIGH (ref 4.0–10.5)
nRBC: 0 % (ref 0.0–0.2)

## 2024-03-24 LAB — URINALYSIS, ROUTINE W REFLEX MICROSCOPIC
Bilirubin Urine: NEGATIVE
Glucose, UA: 50 mg/dL — AB
Hgb urine dipstick: NEGATIVE
Ketones, ur: NEGATIVE mg/dL
Leukocytes,Ua: NEGATIVE
Nitrite: NEGATIVE
Protein, ur: >=300 mg/dL — AB
Specific Gravity, Urine: 1.031 — ABNORMAL HIGH (ref 1.005–1.030)
pH: 5 (ref 5.0–8.0)

## 2024-03-24 LAB — PROTEIN / CREATININE RATIO, URINE
Creatinine, Urine: 238 mg/dL
Protein Creatinine Ratio: 0.63 mg/mg{creat} — ABNORMAL HIGH (ref 0.00–0.15)
Total Protein, Urine: 150 mg/dL

## 2024-03-24 LAB — GLUCOSE, CAPILLARY: Glucose-Capillary: 211 mg/dL — ABNORMAL HIGH (ref 70–99)

## 2024-03-24 LAB — MAGNESIUM: Magnesium: 1.7 mg/dL (ref 1.6–2.3)

## 2024-03-24 MED ORDER — LACTATED RINGERS IV SOLN: INTRAVENOUS | Status: AC

## 2024-03-24 MED ORDER — LACTATED RINGERS IV SOLN: Freq: Once | INTRAVENOUS | Status: AC

## 2024-03-24 MED ORDER — LABETALOL HCL 5 MG/ML IV SOLN
40.0000 mg | INTRAVENOUS | Status: DC | PRN
  Filled 2024-03-24: qty 8

## 2024-03-24 MED ORDER — METFORMIN HCL 500 MG PO TABS
1000.0000 mg | ORAL_TABLET | Freq: Two times a day (BID) | ORAL | Status: DC
  Administered 2024-03-24 – 2024-04-13 (×40): 1000 mg via ORAL
  Filled 2024-03-24 (×43): qty 2

## 2024-03-24 MED ORDER — LABETALOL HCL 5 MG/ML IV SOLN: 80.0000 mg | INTRAVENOUS | Status: DC | PRN

## 2024-03-24 MED ORDER — BISACODYL 5 MG PO TBEC
5.0000 mg | DELAYED_RELEASE_TABLET | Freq: Once | ORAL | Status: DC
  Filled 2024-03-24: qty 1

## 2024-03-24 MED ORDER — NIFEDIPINE ER OSMOTIC RELEASE 60 MG PO TB24
60.0000 mg | ORAL_TABLET | Freq: Two times a day (BID) | ORAL | Status: DC
  Administered 2024-03-24 – 2024-04-14 (×42): 60 mg via ORAL
  Filled 2024-03-24: qty 2
  Filled 2024-03-24 (×5): qty 1
  Filled 2024-03-24: qty 2
  Filled 2024-03-24 (×9): qty 1
  Filled 2024-03-24: qty 2
  Filled 2024-03-24 (×26): qty 1

## 2024-03-24 MED ORDER — NIFEDIPINE 10 MG PO CAPS
10.0000 mg | ORAL_CAPSULE | ORAL | Status: DC | PRN
  Administered 2024-03-24: 10 mg via ORAL
  Filled 2024-03-24: qty 1

## 2024-03-24 MED ORDER — MAGNESIUM SULFATE BOLUS VIA INFUSION
4.0000 g | Freq: Once | INTRAVENOUS | Status: AC
  Administered 2024-03-24: 4 g via INTRAVENOUS
  Filled 2024-03-24: qty 1000

## 2024-03-24 MED ORDER — LACTATED RINGERS IV SOLN: 125.0000 mL/h | INTRAVENOUS | Status: DC

## 2024-03-24 MED ORDER — LABETALOL HCL 5 MG/ML IV SOLN
40.0000 mg | INTRAVENOUS | Status: DC | PRN
  Administered 2024-03-24: 40 mg via INTRAVENOUS
  Filled 2024-03-24: qty 8

## 2024-03-24 MED ORDER — BETAMETHASONE SOD PHOS & ACET 6 (3-3) MG/ML IJ SUSP
12.0000 mg | INTRAMUSCULAR | Status: AC
  Administered 2024-03-24 – 2024-03-25 (×2): 12 mg via INTRAMUSCULAR
  Filled 2024-03-24: qty 5

## 2024-03-24 MED ORDER — FAMOTIDINE IN NACL 20-0.9 MG/50ML-% IV SOLN
20.0000 mg | Freq: Once | INTRAVENOUS | Status: AC
  Administered 2024-03-24: 20 mg via INTRAVENOUS
  Filled 2024-03-24: qty 50

## 2024-03-24 MED ORDER — INSULIN ASPART 100 UNIT/ML IJ SOLN
0.0000 [IU] | Freq: Three times a day (TID) | INTRAMUSCULAR | Status: DC
  Administered 2024-03-24: 7 [IU] via SUBCUTANEOUS
  Administered 2024-03-25 – 2024-04-01 (×7): 3 [IU] via SUBCUTANEOUS
  Administered 2024-04-02: 5 [IU] via SUBCUTANEOUS
  Administered 2024-04-03: 3 [IU] via SUBCUTANEOUS
  Administered 2024-04-03: 7 [IU] via SUBCUTANEOUS
  Administered 2024-04-04: 5 [IU] via SUBCUTANEOUS
  Administered 2024-04-05 (×2): 3 [IU] via SUBCUTANEOUS
  Administered 2024-04-07 (×2): 5 [IU] via SUBCUTANEOUS
  Administered 2024-04-08: 3 [IU] via SUBCUTANEOUS
  Administered 2024-04-08: 5 [IU] via SUBCUTANEOUS
  Administered 2024-04-09 – 2024-04-12 (×5): 3 [IU] via SUBCUTANEOUS
  Administered 2024-04-12 (×2): 5 [IU] via SUBCUTANEOUS
  Administered 2024-04-13: 3 [IU] via SUBCUTANEOUS
  Filled 2024-03-24 (×2): qty 3

## 2024-03-24 MED ORDER — HYDRALAZINE HCL 20 MG/ML IJ SOLN: 10.0000 mg | INTRAMUSCULAR | Status: DC | PRN

## 2024-03-24 MED ORDER — ASPIRIN 81 MG PO CHEW
162.0000 mg | CHEWABLE_TABLET | Freq: Every day | ORAL | Status: DC
  Administered 2024-03-25 – 2024-04-13 (×20): 162 mg via ORAL
  Filled 2024-03-24 (×20): qty 2

## 2024-03-24 MED ORDER — NIFEDIPINE 10 MG PO CAPS
20.0000 mg | ORAL_CAPSULE | ORAL | Status: DC | PRN
  Administered 2024-03-24: 20 mg via ORAL

## 2024-03-24 MED ORDER — HYDRALAZINE HCL 20 MG/ML IJ SOLN
10.0000 mg | Freq: Once | INTRAMUSCULAR | Status: AC
  Administered 2024-03-24: 10 mg via INTRAVENOUS
  Filled 2024-03-24: qty 1

## 2024-03-24 MED ORDER — PRENATAL MULTIVITAMIN CH
1.0000 | ORAL_TABLET | Freq: Every day | ORAL | Status: DC
Start: 2024-03-25 — End: 2024-04-14
  Administered 2024-03-25 – 2024-04-12 (×19): 1 via ORAL
  Filled 2024-03-24 (×19): qty 1

## 2024-03-24 MED ORDER — FAMOTIDINE 20 MG PO TABS
20.0000 mg | ORAL_TABLET | Freq: Two times a day (BID) | ORAL | Status: DC
  Administered 2024-03-24 – 2024-04-06 (×27): 20 mg via ORAL
  Filled 2024-03-24 (×27): qty 1

## 2024-03-24 MED ORDER — INSULIN ASPART 100 UNIT/ML IJ SOLN
20.0000 [IU] | Freq: Three times a day (TID) | INTRAMUSCULAR | Status: DC
  Administered 2024-03-25 – 2024-03-28 (×9): 20 [IU] via SUBCUTANEOUS

## 2024-03-24 MED ORDER — CALCIUM CARBONATE ANTACID 500 MG PO CHEW
2.0000 | CHEWABLE_TABLET | ORAL | Status: DC | PRN
  Administered 2024-03-25 – 2024-04-12 (×5): 400 mg via ORAL
  Filled 2024-03-24 (×5): qty 2

## 2024-03-24 MED ORDER — LABETALOL HCL 200 MG PO TABS
400.0000 mg | ORAL_TABLET | Freq: Three times a day (TID) | ORAL | Status: DC
  Administered 2024-03-25 – 2024-03-27 (×8): 400 mg via ORAL
  Filled 2024-03-24 (×8): qty 2

## 2024-03-24 MED ORDER — MAGNESIUM SULFATE 40 GM/1000ML IV SOLN
2.0000 g/h | INTRAVENOUS | Status: AC
  Administered 2024-03-24 – 2024-03-25 (×2): 2 g/h via INTRAVENOUS
  Filled 2024-03-24 (×2): qty 1000

## 2024-03-24 MED ORDER — LABETALOL HCL 5 MG/ML IV SOLN
40.0000 mg | Freq: Once | INTRAVENOUS | Status: AC
  Administered 2024-03-24: 40 mg via INTRAVENOUS

## 2024-03-24 MED ORDER — LABETALOL HCL 5 MG/ML IV SOLN
20.0000 mg | INTRAVENOUS | Status: DC | PRN
  Administered 2024-03-24: 20 mg via INTRAVENOUS
  Filled 2024-03-24 (×3): qty 4

## 2024-03-24 MED ORDER — ACETAMINOPHEN-CAFFEINE 500-65 MG PO TABS
2.0000 | ORAL_TABLET | Freq: Once | ORAL | Status: AC
  Administered 2024-03-24: 2 via ORAL
  Filled 2024-03-24: qty 2

## 2024-03-24 MED ORDER — NIFEDIPINE 10 MG PO CAPS
20.0000 mg | ORAL_CAPSULE | ORAL | Status: DC | PRN
  Filled 2024-03-24: qty 2

## 2024-03-24 MED ORDER — POLYETHYLENE GLYCOL 3350 17 G PO PACK
17.0000 g | PACK | ORAL | Status: AC
  Administered 2024-03-27 – 2024-03-29 (×2): 17 g via ORAL
  Filled 2024-03-24 (×3): qty 1

## 2024-03-24 NOTE — Plan of Care (Signed)
   Problem: Education: Goal: Knowledge of General Education information will improve Description: Including pain rating scale, medication(s)/side effects and non-pharmacologic comfort measures Outcome: Completed/Met

## 2024-03-24 NOTE — Progress Notes (Signed)
 Throughout duration of this pt stay in the MAU, this RN remained at bedside for majority of the time. This pt required continuous superversion due to severe BP and need for magnesium IV and IV BP meds in attempt to get a BP that was underneath the severe range. After exhausting multiple BP medications (see MAR), without achieving a normal BP, Cooleen NP notified [place] MD. Both providers aware of FHR strip without accelerations throughout duration of stay in MAU. This RN relinquished care to [facility] speciality care under MD orders.

## 2024-03-24 NOTE — MAU Note (Signed)
 Summer Campbell is a 33 y.o. at [redacted]w[redacted]d here in MAU reporting: she was sent from [facility] office for BP evaluation, reports BP 172/98 during visit.  States hadn't taken BP meds prior to visit but has since taken them.Denies epigastric pain and visual disturbances, reports slight HA.  Denies LOF and VB.  Reports +FM.  LMP:  Onset of complaint: today Pain score: 3 There were no vitals filed for this visit.   FHT: 143 bpm  Lab orders placed from triage: UA

## 2024-03-24 NOTE — MAU Provider Note (Signed)
 Chief Complaint:  BP Evaluation   HPI     Summer Campbell is a 33 y.o. G2P0010 at [redacted]w[redacted]d who presents to maternity admissions reporting she was sent to the MAU for severe range blood pressure of 172/98 after being seen in the office today for routine prenatal appointment at  1434.  Patient is a chronic hypertensive on labetalol  400 mg 3 times daily and Procardia  60 XL twice daily.  She reports she has a mild headache but is contributing it to not eating and some mild heartburn.  Patient did report she had a protein bar on the way here but her headache is still rated at  3/10 on presentation. She is also complaining of lower extremity edema but denies any shortness of breath, chest pain or right upper quadrant pain.  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   Past Surgical History:  Procedure Laterality Date   ADENOIDECTOMY     TONSILLECTOMY     Family History  Problem Relation Age of Onset   Drug abuse Mother    Alcohol abuse Mother    Depression Mother    Hypertension Mother    Diabetes Mother    Bipolar disorder Mother    Schizophrenia Mother    Pancreatitis Mother    Alcohol abuse Father    Stroke Father    Depression Father    Hypertension Father    Diabetes Father    Prostate cancer Father    Liver disease Father    Kidney disease Father     Suicidality Father    Social History   Tobacco Use   Smoking status: Never   Smokeless tobacco: Never  Vaping Use   Vaping status: Never Used  Substance Use Topics   Alcohol use: Not Currently    Comment: hx of alcohol abuse but stopped drinking heavily April 20, 2018, then socially   Drug use: Not Currently    Types: Marijuana    Comment: just after Dad passed away, not since Apr 20, 2022   No Known Allergies Medications Prior to Admission  Medication Sig Dispense Refill Last Dose/Taking   ALPHA LIPOIC ACID PO Take 1 tablet by mouth daily at 6 (six) AM.   03/23/2024   aspirin  EC 81 MG tablet Take 2 tablets (162 mg total) by mouth at bedtime. Start taking when you are [redacted] weeks pregnant for rest of pregnancy for prevention of preeclampsia 300 tablet 2 03/24/2024   Cholecalciferol (VITAMIN D-3) 125 MCG (5000 UT) TABS Take 1 tablet by mouth daily at 6 (six) AM.   03/24/2024   Continuous Glucose Sensor (DEXCOM G7 SENSOR) MISC Use one sensor every 10  days as instructed 9 each 3 03/24/2024 Morning   cyanocobalamin (VITAMIN B12) 1000 MCG tablet Take 1,000 mcg by mouth daily.   03/24/2024   escitalopram (LEXAPRO) 20 MG tablet Take 20 mg by mouth daily.   03/24/2024   famotidine (PEPCID) 20 MG tablet Take 20 mg by mouth 2 (two) times daily.   03/24/2024   furosemide (LASIX) 40 MG tablet Take 1 tablet (40 mg total) by mouth daily. 10 tablet 0 Past Month   insulin  aspart (NOVOLOG  FLEXPEN) 100 UNIT/ML FlexPen Inject 20 Units into the skin 3 (three) times daily with meals. 30 mL 2 03/24/2024 Morning   Labetalol  HCl 400 MG TABS Take 400 mg by mouth 3 (three) times daily. 30 tablet 2 03/24/2024 at  2:50 PM   metFORMIN  (GLUCOPHAGE ) 500 MG tablet Take 2 tablets (1,000 mg total) by mouth 2 (two) times daily with a meal. 120 tablet 2 03/24/2024 Morning   NIFEdipine  (PROCARDIA  XL/NIFEDICAL XL) 60 MG 24 hr tablet Take 1 tablet (60 mg total) by mouth in the morning and at bedtime. 120 tablet 3 03/24/2024 at  2:50 PM    ondansetron  (ZOFRAN  ODT) 8 MG disintegrating tablet 8mg  ODT q8 hours prn nausea 10 tablet 0 03/23/2024   potassium chloride SA (KLOR-CON M20) 20 MEQ tablet Take 1 tablet (20 mEq total) by mouth daily. 10 tablet 1 Past Month   Prenatal MV & Min w/FA-DHA (PRENATAL GUMMIES PO) Take 2 tablets by mouth daily at 6 (six) AM.   03/24/2024   Calcium Carb-Cholecalciferol (CALCIUM 600+D3) 600-20 MG-MCG TABS Take 1 tablet by mouth daily at 6 (six) AM.      insulin  degludec (TRESIBA) 200 UNIT/ML FlexTouch Pen Inject 30 Units into the skin daily. (Patient taking differently: Inject 25 Units into the skin daily.)      Insulin  Pen Needle (PEN NEEDLES) 32G X 4 MM MISC 1 each by Does not apply route.       I have reviewed patient's Past Medical Hx, Surgical Hx, Family Hx, Social Hx, medications and allergies.   ROS  Pertinent items noted in HPI and remainder of comprehensive ROS otherwise negative.   PHYSICAL EXAM  Patient Vitals for the past 24 hrs:  BP Temp Temp src Pulse Resp SpO2 Height Weight  03/24/24 1700 (!) 162/90 -- -- 86 -- -- -- --  03/24/24 1645 (!) 167/84 -- -- 81 -- -- -- --  03/24/24 1630 (!) 170/87 -- -- 83 -- -- -- --  03/24/24 1615 (!) 164/78 -- -- 80 -- -- -- --  03/24/24 1600 -- -- -- -- -- 96 % -- --  03/24/24 1555 (!) 179/84 -- -- 84 -- 95 % -- --  03/24/24 1526 (!) 161/83 98.1 F (36.7 C) Oral 71 18 98 % -- --  03/24/24 1521 -- -- -- -- -- -- 5' 4 (1.626 m) (!) 152.5 kg   Constitutional: Well-developed, well-nourished female in no acute distress.  Cardiovascular: normal rate & rhythm, warm and well-perfused Respiratory: normal effort, no problems with respiration noted GI: Abd soft, non-tender, gravid MS: Extremities nontender, no edema, normal ROM Neurologic: Alert and oriented x 4.  GU: no CVA tenderness      Fetal Tracing: NST not Reactive and discontinuous due to increased maternal body  habitus   Baseline: 140 Variability: moderate  Accelerations:  absent Decelerations:absent Toco: quite    Labs: Results for orders placed or performed during the hospital encounter of 03/24/24 (from the past 24 hours)  CBC  Status: Abnormal   Collection Time: 03/24/24  3:35 PM  Result Value Ref Range   WBC 12.1 (H) 4.0 - 10.5 K/uL   RBC 4.05 3.87 - 5.11 MIL/uL   Hemoglobin 11.7 (L) 12.0 - 15.0 g/dL   HCT 64.3 (L) 63.9 - 53.9 %   MCV 87.9 80.0 - 100.0 fL   MCH 28.9 26.0 - 34.0 pg   MCHC 32.9 30.0 - 36.0 g/dL   RDW 87.3 88.4 - 84.4 %   Platelets 268 150 - 400 K/uL   nRBC 0.0 0.0 - 0.2 %  Comprehensive metabolic panel     Status: Abnormal   Collection Time: 03/24/24  3:35 PM  Result Value Ref Range   Sodium 137 135 - 145 mmol/L   Potassium 4.3 3.5 - 5.1 mmol/L   Chloride 104 98 - 111 mmol/L   CO2 22 22 - 32 mmol/L   Glucose, Bld 121 (H) 70 - 99 mg/dL   BUN 15 6 - 20 mg/dL   Creatinine, Ser 9.36 0.44 - 1.00 mg/dL   Calcium 8.8 (L) 8.9 - 10.3 mg/dL   Total Protein 5.8 (L) 6.5 - 8.1 g/dL   Albumin 2.4 (L) 3.5 - 5.0 g/dL   AST 20 15 - 41 U/L   ALT 21 0 - 44 U/L   Alkaline Phosphatase 52 38 - 126 U/L   Total Bilirubin 0.2 0.0 - 1.2 mg/dL   GFR, Estimated >39 >39 mL/min   Anion gap 11 5 - 15    Imaging:  No results found.  MDM & MAU COURSE  MDM:  HIGH- > Admission  CBC: NM  CMP: LFTS's NM, Serum Creatine NM, Glucose elevated  P/C Ratio: pending on admission  BP Monitoring ( SRBP's sustained after treatment)  PreE Focused order set  HA: Excedrin ( with some resolve) Pepcid IV for heartburn pain  per patient    D/W Dr Lola ( Attending) @ [phone] due to Dr Ozan currently unavailable in the OR  Will plan for admission due to cHTN with Superimposed PreE  Magnesium for seizure ppx Antenatal steroids  Blood pressure management  NICU called by Dr Lola at time of admission    Differential Diagnosis of Hypertension in pregnancy can be preexisting chronic hypertension, Gestational hypertension, preeclampsia, preeclampsia with  Severe features, Chronic hypertension with superimposed Preeclampsia, HELLP Syndrome, all of these conditions can lead to Eclampsia, pulmonary edema, IUGR, and/or placenta abruption. Precise diagnosis is often challenging. High clinical suspicion is warranted given the increase in maternal and fetal-neonatal risks associated with preeclampsia.   MAU Course: Orders Placed This Encounter  Procedures   CBC   Comprehensive metabolic panel   Protein / creatinine ratio, urine   Urinalysis, Routine w reflex microscopic -Urine, Clean Catch   Notify physician (specify) Confirmatory reading of BP> 160/110 15 minutes later   Apply Hypertensive Disorders of Pregnancy Care Plan   Vital signs   Strict intake and output   Measure blood pressure   Saline lock IV   Admit to Inpatient (patient's expected length of stay will be greater than 2 midnights or inpatient only procedure)   Meds ordered this encounter  Medications   DISCONTD: NIFEdipine  (PROCARDIA ) capsule 10 mg   DISCONTD: NIFEdipine  (PROCARDIA ) capsule 20 mg   DISCONTD: NIFEdipine  (PROCARDIA ) capsule 20 mg   DISCONTD: labetalol  (NORMODYNE ) injection 40 mg   acetaminophen -caffeine (EXCEDRIN TENSION HEADACHE) 500-65 MG per tablet 2 tablet   famotidine (PEPCID) IVPB 20 mg premix   lactated ringers infusion  magnesium bolus via infusion 4 g   magnesium sulfate 40 grams in SWI 1000 mL OB infusion   lactated ringers infusion   AND Linked Order Group    labetalol  (NORMODYNE ) injection 20 mg    labetalol  (NORMODYNE ) injection 40 mg    labetalol  (NORMODYNE ) injection 80 mg    hydrALAZINE (APRESOLINE) injection 10 mg   betamethasone acetate-betamethasone sodium phosphate (CELESTONE) injection 12 mg      I have reviewed the patient chart and performed the physical exam . I have ordered & interpreted the lab results and reviewed and interpreted the NST Medications ordered as stated below.  A/P as described below.  Counseling and education  provided and patient agreeable  with plan as described below. Verbalized understanding.    ASSESSMENT   1. Chronic hypertension with superimposed preeclampsia   2. Supervision of high risk pregnancy, antepartum   3. Maternal morbid obesity, antepartum (HCC)   4. [redacted] weeks gestation of pregnancy   5. BMI 50.0-59.9, adult [facility])     PLAN  Admission to OBS  antepartum service Magnesium for seizure ppx ( Orders placed) Antenatal steroids ( Orders placed) Blood pressure management  NICU called by Dr Lola at time of admission    -------------------------------------------------------------------------------------------- Olam Dalton, MSN, [facility], Center for [facility]    This chart was dictated using voice recognition software, Dragon. Despite the best efforts of this provider to proofread and correct errors, errors may still occur which can change documentation meaning.

## 2024-03-24 NOTE — Progress Notes (Signed)
 PRENATAL VISIT NOTE  Subjective:  Summer Campbell is a 33 y.o. G2P0010 at [redacted]w[redacted]d being seen today for ongoing prenatal care.  She is currently monitored for the following issues for this high-risk pregnancy and has Herpes simplex infection of perianal skin; Hyperlipidemia associated with type 2 diabetes mellitus (HCC); Hypertension associated with diabetes (HCC); Type 2 diabetes mellitus without complication, with long-term current use of insulin  (HCC); LGSIL on Pap smear of cervix; Supervision of high risk pregnancy, antepartum; Maternal morbid obesity, antepartum (HCC); Preexisting diabetes complicating pregnancy, antepartum; and Preexisting hypertension complicating pregnancy, antepartum on their problem list.  Patient reports no bleeding, no contractions, no cramping, and no leaking.  Contractions: Not present. Vag. Bleeding: None.  Movement: Present. Denies leaking of fluid.   The following portions of the patient's history were reviewed and updated as appropriate: allergies, current medications, past family history, past medical history, past social history, past surgical history and problem list.   Objective:    Vitals:   03/24/24 1333  BP: (!) 149/70  Pulse: 91  Weight: (!) 324 lb (147 kg)    Fetal Status:      Movement: Present    General: Alert, oriented and cooperative. Patient is in no acute distress.  Skin: Skin is warm and dry. No rash noted.   Cardiovascular: Normal heart rate noted  Respiratory: Normal respiratory effort, no problems with respiration noted  Abdomen: Soft, gravid, appropriate for gestational age.  Pain/Pressure: Absent     Pelvic: Cervical exam deferred        Extremities: Normal range of motion.  Edema: None  Mental Status: Normal mood and affect. Normal behavior. Normal judgment and thought content.   Assessment and Plan:  Pregnancy: G2P0010 at [redacted]w[redacted]d 1. Supervision of high risk pregnancy, antepartum (Primary) FHR appropriate today BP elevated see  below  2. Type 2 diabetes mellitus without complication, with long-term current use of insulin  (HCC) On metformin  and insulin  Dexcom showing she is in range 89% of the time.  Just decreased from 30 units of long-acting insulin  to 25 because of recurrent lows less than 40.  Continue current regimen  3. Preexisting hypertension complicating pregnancy, antepartum Poorly controlled hypertension, sees cardiology On labetalol  400 mg 3 times daily and Procardia  60 mg twice daily Initial blood pressure 149/70.  Repeat blood pressure 160s/100s>170s/100s.  Patient reports she is due for her labetalol  which was supposed to happen at 2 PM.  Discussed because of her severe range blood pressures needed to send her for evaluation for preeclampsia.  Concerned that this could be chronic hypertension with superimposed preeclampsia.  She is going to take her blood pressure medications on the way.  Called the MAU and notified them that she is coming.  4. Maternal morbid obesity, antepartum (HCC) Difficulty finding fetal heart rate via Dopplers, ultrasound showing 146.  5. [redacted] weeks gestation of pregnancy   Preterm labor symptoms and general obstetric precautions including but not limited to vaginal bleeding, contractions, leaking of fluid and fetal movement were reviewed in detail with the patient. Please refer to After Visit Summary for other counseling recommendations.   No follow-ups on file.  Future Appointments  Date Time Provider Department Center  03/25/2024 10:15 AM [facility]  03/25/2024 11:15 AM [facility] PROVIDER 1 [facility]  03/25/2024 11:30 AM [facility] US5 [facility]  03/26/2024 10:30 AM Pavero, Lonni, [facility]MAGST H&V  04/02/2024  2:20 PM Tobb, Kardie, DO CVD-WMC None  04/06/2024  3:15 PM Nicholaus Burnard HERO, MD [facility]  04/15/2024  6:15 AM HVC-ECHO 1 HVC-ECHO H&V  04/16/2024  2:00 PM Tobb, Kardie, DO CVD-WMC None  04/20/2024 10:55 AM Fredirick Glenys RAMAN, MD [facility]   04/22/2024 11:15 AM W[facility] PROVIDER 1 [facility]  04/22/2024 11:30 AM [facility]MFC US1 [facility]MFC[facility]  05/04/2024 11:15 AM Eveline Lynwood MATSU, MD [facility]  05/11/2024  1:15 PM Cleatus Moccasin, MD [facility]  05/18/2024 11:15 AM WMC-GENERAL 1 WMC-CWH [facility]  05/25/2024  4:15 PM WMC-GENERAL 1 WMC-CWH [facility]  06/01/2024  1:15 PM Zina Jerilynn LABOR, MD [facility]    Norleen LULLA Rover, MD

## 2024-03-24 NOTE — MAU Provider Note (Cosign Needed Addendum)
@   1757/ Spoke with Dr. Izell (MAU OB attending) concern for severe range blood pressures &  patient is unstable to transfer to antepartum service and NST non reactive (Cat 1) Last antihypertensive administered 10 mg IV hydralazine at 1740. Blood pressure cuff adjusted to right forearm. At 1800 blood pressure was 164/94. Order placed for 40 mg  IV push Labetalol  per Dr Elvera. If BP still remains in severe range we will ask for MD to bedside for further assistance in stabilizing BP and additional management.  @ 1823 BP at 145/80 ( Stable for Transfer to Antepartum Service) RN will transfer patient via stretcher assisted by technician) --------------------------------------------------------------------------------------------------- Olam Dalton, MSN, [facility], Center for [facility]

## 2024-03-24 NOTE — H&P (Signed)
 Obstetrics Admission History & Physical  03/24/2024 - 7:12 PM Primary OBGYN: [facility]  Chief Complaint: severe pre-eclampsia (BPs) superimposed on CHTN  History of Present Illness  33 y.o. G2P0010 at [redacted]w[redacted]d, with the above CC. Pregnancy complicated by: CHTN, DM2, BMI 50, FGR  Patient had routine OB visit and had elevated BPs which have persisted in OB triage even with multiple doses of IV medications; she states she took her am procardia  and her am and afternoon labetalol . No pre-eclampsia s/s or decreased FM or PTL.   Review of Systems: as noted in the History of Present Illness.  Patient Active Problem List   Diagnosis Date Noted   IUGR (intrauterine growth restriction) affecting care of mother 03/25/2024   Chronic hypertension with superimposed preeclampsia 03/24/2024   Severe pre-eclampsia 03/24/2024   Preexisting diabetes complicating pregnancy, antepartum 01/01/2024   Preexisting hypertension complicating pregnancy, antepartum 01/01/2024   Supervision of high risk pregnancy, antepartum 12/18/2023   Maternal morbid obesity, antepartum (HCC) 12/18/2023   LGSIL on Pap smear of cervix 06/21/2022   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/24/2020   Hyperlipidemia associated with type 2 diabetes mellitus (HCC) 10/24/2020   Hypertension associated with diabetes (HCC) 10/24/2020   Type 2 diabetes mellitus without complication, with long-term current use of insulin  (HCC) 10/24/2020     PMHx:  Past Medical History:  Diagnosis Date   Bipolar affective disorder (HCC)    Depression    Diabetes mellitus without complication (HCC)    Hypertension    PSHx:  Past Surgical History:  Procedure Laterality Date   ADENOIDECTOMY     TONSILLECTOMY     Medications:  Medications Prior to Admission  Medication Sig Dispense Refill Last Dose/Taking   ALPHA LIPOIC ACID PO Take 1 tablet by mouth daily at 6 (six) AM.   03/23/2024   aspirin  EC 81 MG tablet Take 2 tablets  (162 mg total) by mouth at bedtime. Start taking when you are [redacted] weeks pregnant for rest of pregnancy for prevention of preeclampsia 300 tablet 2 03/24/2024   Cholecalciferol (VITAMIN D-3) 125 MCG (5000 UT) TABS Take 1 tablet by mouth daily at 6 (six) AM.   03/24/2024   Continuous Glucose Sensor (DEXCOM G7 SENSOR) MISC Use one sensor every 10 days as instructed 9 each 3 03/24/2024 Morning   cyanocobalamin (VITAMIN B12) 1000 MCG tablet Take 1,000 mcg by mouth daily.   03/24/2024   escitalopram (LEXAPRO) 20 MG tablet Take 20 mg by mouth daily.   03/24/2024   famotidine (PEPCID) 20 MG tablet Take 20 mg by mouth 2 (two) times daily.   03/24/2024   furosemide (LASIX) 40 MG tablet Take 1 tablet (40 mg total) by mouth daily. 10 tablet 0 Past Month   insulin  aspart (NOVOLOG  FLEXPEN) 100 UNIT/ML FlexPen Inject 20 Units into the skin 3 (three) times daily with meals. 30 mL 2 03/24/2024 Morning   Labetalol  HCl 400 MG TABS Take 400 mg by mouth 3 (three) times daily. 30 tablet 2 03/24/2024 at  2:50 PM   metFORMIN  (GLUCOPHAGE ) 500 MG tablet Take 2 tablets (1,000 mg total) by mouth 2 (two) times daily with a meal. 120 tablet 2 03/24/2024 Morning   NIFEdipine  (PROCARDIA  XL/NIFEDICAL XL) 60 MG 24 hr tablet Take 1 tablet (60 mg total) by mouth in the morning and at bedtime. 120 tablet 3 03/24/2024 at  2:50 PM   ondansetron  (ZOFRAN  ODT) 8 MG disintegrating tablet 8mg  ODT q8 hours prn nausea 10 tablet 0 03/23/2024  potassium chloride SA (KLOR-CON M20) 20 MEQ tablet Take 1 tablet (20 mEq total) by mouth daily. 10 tablet 1 Past Month   Prenatal MV & Min w/FA-DHA (PRENATAL GUMMIES PO) Take 2 tablets by mouth daily at 6 (six) AM.   03/24/2024   Calcium Carb-Cholecalciferol (CALCIUM 600+D3) 600-20 MG-MCG TABS Take 1 tablet by mouth daily at 6 (six) AM.      insulin  degludec (TRESIBA) 200 UNIT/ML FlexTouch Pen Inject 30 Units into the skin daily. (Patient taking differently: Inject 25 Units into the skin daily.)      Insulin   Pen Needle (PEN NEEDLES) 32G X 4 MM MISC 1 each by Does not apply route.        Allergies: has no known allergies. OBHx:  OB History  Gravida Para Term Preterm AB Living  2    1   SAB IAB Ectopic Multiple Live Births  1        # Outcome Date GA Lbr Len/2nd Weight Sex Type Anes PTL Lv  2 Current           1 SAB 02/15/14 [redacted]w[redacted]d            Birth Comments: had + pregnancy test at home and passed tissue/ blood before saw provider.          FHx:  Family History  Problem Relation Age of Onset   Drug abuse Mother    Alcohol abuse Mother    Depression Mother    Hypertension Mother    Diabetes Mother    Bipolar disorder Mother    Schizophrenia Mother    Pancreatitis Mother    Alcohol abuse Father    Stroke Father    Depression Father    Hypertension Father    Diabetes Father    Prostate cancer Father    Liver disease Father    Kidney disease Father    Suicidality Father    Soc Hx:  Social History   Socioeconomic History   Marital status: Single    Spouse name: Not on file   Number of children: Not on file   Years of education: Not on file   Highest education level: Not on file  Occupational History   Not on file  Tobacco Use   Smoking status: Never   Smokeless tobacco: Never  Vaping Use   Vaping status: Never Used  Substance and Sexual Activity   Alcohol use: Not Currently    Comment: hx of alcohol abuse but stopped drinking heavily 2018/04/22, then socially   Drug use: Not Currently    Types: Marijuana    Comment: just after Dad passed away, not since 04-22-2022   Sexual activity: Yes    Birth control/protection: None  Other Topics Concern   Not on file  Social History Narrative   Not on file   Social Drivers of Health   Financial Resource Strain: Medium Risk (10/14/2023)   Received from [facility]   Overall Financial Resource Strain (CARDIA)    Difficulty of Paying Living Expenses: Somewhat hard  Food Insecurity: No Food Insecurity (03/24/2024)   Hunger Vital  Sign    Worried About Running Out of Food in the Last Year: Never true    Ran Out of Food in the Last Year: Never true  Transportation Needs: No Transportation Needs (03/24/2024)   PRAPARE - Administrator, Civil Service (Medical): No    Lack of Transportation (Non-Medical): No  Recent Concern: Transportation Needs - Unmet Transportation Needs (01/01/2024)  PRAPARE - Administrator, Civil Service (Medical): Yes    Lack of Transportation (Non-Medical): Yes  Physical Activity: Sufficiently Active (10/14/2023)   Received from [facility]   Exercise Vital Sign    On average, how many days per week do you engage in moderate to strenuous exercise (like a brisk walk)?: 6 days    On average, how many minutes do you engage in exercise at this level?: 150+ min  Stress: Stress Concern Present (10/14/2023)   Received from [facility] - Occupational Stress Questionnaire    Feeling of Stress : Rather much  Social Connections: Socially Isolated (10/14/2023)   Received from [facility]   Social Network    How would you rate your social network (family, work, friends)?: Little participation, lonely and socially isolated  Intimate Partner Violence: Not At Risk (03/24/2024)   Humiliation, Afraid, Rape, and Kick questionnaire    Fear of Current or Ex-Partner: No    Emotionally Abused: No    Physically Abused: No    Sexually Abused: No    Objective  Patient Vitals for the past 12 hrs:  BP Temp Temp src Pulse Resp SpO2 Height Weight  03/24/24 1845 -- -- -- -- -- 97 % -- --  03/24/24 1840 -- -- -- -- -- 98 % -- --  03/24/24 1835 (!) 151/76 98.2 F (36.8 C) Oral 87 20 98 % -- --  03/24/24 1825 (!) 145/80 -- -- 89 -- -- -- --  03/24/24 1820 -- -- -- -- -- 96 % -- --  03/24/24 1815 -- -- -- -- -- 96 % -- --  03/24/24 1810 -- -- -- -- -- 96 % -- --  03/24/24 1805 -- -- -- -- -- 96 % -- --  03/24/24 1800 (!) 164/94 -- -- 89 -- -- --  --  03/24/24 1750 -- -- -- -- -- 97 % -- --  03/24/24 1745 (!) 180/89 -- -- 89 -- 96 % -- --  03/24/24 1740 -- -- -- -- -- 96 % -- --  03/24/24 1735 -- -- -- -- -- 97 % -- --  03/24/24 1730 (!) 175/87 -- -- 91 -- 96 % -- --  03/24/24 1725 (!) 179/89 -- -- 90 -- 95 % -- --  03/24/24 1722 -- -- -- -- 16 97 % -- --  03/24/24 1720 (!) 172/87 -- -- 89 -- 97 % -- --  03/24/24 1715 -- -- -- -- -- 97 % -- --  03/24/24 1711 -- 98.2 F (36.8 C) -- 89 16 97 % -- --  03/24/24 1700 (!) 162/90 -- -- 86 -- -- -- --  03/24/24 1645 (!) 167/84 -- -- 81 -- -- -- --  03/24/24 1630 (!) 170/87 -- -- 83 -- -- -- --  03/24/24 1615 (!) 164/78 -- -- 80 -- -- -- --  03/24/24 1600 -- -- -- -- -- 96 % -- --  03/24/24 1555 (!) 179/84 -- -- 84 -- 95 % -- --  03/24/24 1526 (!) 161/83 98.1 F (36.7 C) Oral 71 18 98 % -- --  03/24/24 1521 -- -- -- -- -- -- 5' 4 (1.626 m) (!) 152.5 kg     Current Vital Signs 24h Vital Sign Ranges  T 98.2 F (36.8 C) Temp  Avg: 98.2 F (36.8 C)  Min: 98.1 F (36.7 C)  Max: 98.2 F (36.8 C)  BP (!) 151/76 BP  Min: 145/80  Max: 180/89  HR 87 Pulse  Avg: 85.9  Min: 71  Max: 91  RR 20 Resp  Avg: 17.5  Min: 16  Max: 20  SaO2 97 % Room Air SpO2  Avg: 96.6 %  Min: 95 %  Max: 98 %       24 Hour I/O Current Shift I/O  Time Ins Outs No intake/output data recorded. No intake/output data recorded.   Category I tracing, negative toco  General: Well nourished, well developed female in no acute distress.  Skin:  Warm and dry.  Cardiovascular: S1, S2 normal, no murmur, rub or gallop, regular rate and rhythm Respiratory:  Clear to auscultation bilateral. Normal respiratory effort Abdomen: obese, nttp Neuro/Psych:  Normal mood and affect.   Labs  PC ratio 630 Recent Labs  Lab 03/24/24 1535  WBC 12.1*  HGB 11.7*  HCT 35.6*  PLT 268   Recent Labs  Lab 03/23/24 1533 03/24/24 1535  NA 138 137  K 4.8 4.3  CL 102 104  CO2 22 22  BUN 17 15  CREATININE 0.62 0.63  CALCIUM 9.9  8.8*  PROT 6.1 5.8*  BILITOT <0.2 0.2  ALKPHOS 75 52  ALT 19 21  AST 16 20  GLUCOSE 100* 121*   Radiology 9/25: efw 7%, 774g, ac 23%, normal UA dopplers and AFI  Assessment & Plan   33 y.o. G2P0010 at [redacted]w[redacted]d with new severe pre-eclampsia; pt stable *Pregnancy: fetal status reassuring *CV: d/w her and partner re: plan of care with severe pre-eclampsia. She's not sure if she can do a prolonged hospitalization due to finances. I told her we'll talk to her more later re: this. Continue with home meds and IV PRNs, Mg x 24h *Preterm: NICU aware. Consult formally after updated growth u/s is done *FGR: see above *DM2: AM fasting and 2h post prandials ordered along with home metformin  and meal coverage. Follow up with DM re: whether to keep home tresiba or switch to something else  Bebe Izell Raddle MD Attending Center for [facility] (Faculty Practice) GYN Consult Phone: [phone] (M-F, [phone]) & [phone] (Off hours, weekends, holidays)

## 2024-03-25 ENCOUNTER — Inpatient Hospital Stay (HOSPITAL_COMMUNITY)

## 2024-03-25 ENCOUNTER — Ambulatory Visit

## 2024-03-25 ENCOUNTER — Ambulatory Visit: Payer: Self-pay | Admitting: Cardiology

## 2024-03-25 ENCOUNTER — Other Ambulatory Visit

## 2024-03-25 DIAGNOSIS — O1413 Severe pre-eclampsia, third trimester: Secondary | ICD-10-CM

## 2024-03-25 DIAGNOSIS — O99342 Other mental disorders complicating pregnancy, second trimester: Secondary | ICD-10-CM

## 2024-03-25 DIAGNOSIS — O24112 Pre-existing diabetes mellitus, type 2, in pregnancy, second trimester: Secondary | ICD-10-CM | POA: Diagnosis not present

## 2024-03-25 DIAGNOSIS — F419 Anxiety disorder, unspecified: Secondary | ICD-10-CM

## 2024-03-25 DIAGNOSIS — E119 Type 2 diabetes mellitus without complications: Secondary | ICD-10-CM | POA: Diagnosis not present

## 2024-03-25 DIAGNOSIS — O0990 Supervision of high risk pregnancy, unspecified, unspecified trimester: Secondary | ICD-10-CM

## 2024-03-25 DIAGNOSIS — Z3A30 30 weeks gestation of pregnancy: Secondary | ICD-10-CM

## 2024-03-25 DIAGNOSIS — O365990 Maternal care for other known or suspected poor fetal growth, unspecified trimester, not applicable or unspecified: Secondary | ICD-10-CM | POA: Diagnosis present

## 2024-03-25 LAB — TYPE AND SCREEN
ABO/RH(D): A POS
Antibody Screen: NEGATIVE

## 2024-03-25 LAB — GLUCOSE, CAPILLARY
Glucose-Capillary: 116 mg/dL — ABNORMAL HIGH (ref 70–99)
Glucose-Capillary: 65 mg/dL — ABNORMAL LOW (ref 70–99)
Glucose-Capillary: 190 mg/dL — ABNORMAL HIGH (ref 70–99)

## 2024-03-25 MED ORDER — METOCLOPRAMIDE HCL 5 MG/ML IJ SOLN
10.0000 mg | Freq: Three times a day (TID) | INTRAMUSCULAR | Status: DC | PRN
  Administered 2024-03-25 – 2024-04-14 (×4): 10 mg via INTRAVENOUS
  Filled 2024-03-25 (×4): qty 2

## 2024-03-25 MED ORDER — ENOXAPARIN SODIUM 80 MG/0.8ML IJ SOSY
80.0000 mg | PREFILLED_SYRINGE | INTRAMUSCULAR | Status: DC
  Administered 2024-03-25 – 2024-03-28 (×4): 80 mg via SUBCUTANEOUS
  Filled 2024-03-25 (×4): qty 0.8

## 2024-03-25 MED ORDER — ACETAMINOPHEN 325 MG PO TABS
650.0000 mg | ORAL_TABLET | Freq: Four times a day (QID) | ORAL | Status: DC | PRN
  Administered 2024-03-29 – 2024-04-10 (×5): 650 mg via ORAL
  Filled 2024-03-25 (×6): qty 2

## 2024-03-25 MED ORDER — HYDRALAZINE HCL 20 MG/ML IJ SOLN
5.0000 mg | Freq: Once | INTRAMUSCULAR | Status: AC
  Administered 2024-03-25: 5 mg via INTRAVENOUS
  Filled 2024-03-25: qty 1

## 2024-03-25 MED ORDER — INSULIN GLARGINE-YFGN 100 UNIT/ML ~~LOC~~ SOLN
22.0000 [IU] | Freq: Every day | SUBCUTANEOUS | Status: DC
  Administered 2024-03-25 – 2024-03-27 (×3): 22 [IU] via SUBCUTANEOUS
  Filled 2024-03-25 (×4): qty 0.22

## 2024-03-25 MED ORDER — INSULIN ASPART 100 UNIT/ML IJ SOLN
5.0000 [IU] | Freq: Once | INTRAMUSCULAR | Status: AC
  Administered 2024-03-25: 5 [IU] via SUBCUTANEOUS

## 2024-03-25 MED ORDER — LABETALOL HCL 5 MG/ML IV SOLN
INTRAVENOUS | Status: AC
  Administered 2024-03-25: 20 mg via INTRAVENOUS
  Filled 2024-03-25: qty 4

## 2024-03-25 MED ORDER — LABETALOL HCL 5 MG/ML IV SOLN
20.0000 mg | Freq: Once | INTRAVENOUS | Status: AC
  Filled 2024-03-25: qty 4

## 2024-03-25 MED ORDER — VALACYCLOVIR HCL 500 MG PO TABS
500.0000 mg | ORAL_TABLET | Freq: Two times a day (BID) | ORAL | Status: DC
  Administered 2024-03-25 – 2024-04-14 (×41): 500 mg via ORAL
  Filled 2024-03-25 (×41): qty 1

## 2024-03-25 MED ORDER — HYDRALAZINE HCL 20 MG/ML IJ SOLN: 5.0000 mg | Freq: Once | INTRAMUSCULAR | Status: AC

## 2024-03-25 MED ORDER — HYDRALAZINE HCL 20 MG/ML IJ SOLN
INTRAMUSCULAR | Status: AC
  Administered 2024-03-25: 5 mg via INTRAVENOUS
  Filled 2024-03-25: qty 1

## 2024-03-25 NOTE — Progress Notes (Signed)
 FACULTY PRACTICE ANTEPARTUM PROGRESS NOTE  Summer Campbell is a 33 y.o. G2P0010 at [redacted]w[redacted]d who is admitted for superimposed preeclampsia over chronic hypertension.  Estimated Date of Delivery: 06/01/24 Fetal presentation is unknown.  Length of Stay:  1 Days. Admitted 03/24/2024  Subjective: Pt resting quietly.  She denies headache, visual changes and RUQ pain. Patient reports normal fetal movement.  She denies uterine contractions, denies bleeding and leaking of fluid per vagina.  Vitals:  Blood pressure (!) 147/73, pulse 88, temperature 98.6 F (37 C), temperature source Oral, resp. rate 18, height 5' 4 (1.626 m), weight (!) 152.5 kg, SpO2 99%. Physical Examination: CONSTITUTIONAL: Well-developed, obese, well-nourished female in no acute distress.  HENT:  Normocephalic, atraumatic, External right and left ear normal. Oropharynx is clear and moist EYES: Conjunctivae and EOM are normal.  NECK: Normal range of motion, supple, no masses. SKIN: Skin is warm and dry. No rash noted. Not diaphoretic. No erythema. No pallor. NEUROLGIC: Alert and oriented to person, place, and time. Normal reflexes, muscle tone coordination. No cranial nerve deficit noted. PSYCHIATRIC: Normal mood and affect. Normal behavior. Normal judgment and thought content. CARDIOVASCULAR: Normal heart rate noted, regular rhythm RESPIRATORY: Effort and breath sounds normal, no problems with respiration noted MUSCULOSKELETAL: Normal range of motion. No edema and no tenderness. ABDOMEN: Soft, nontender, nondistended, gravid. CERVIX: deferred  Fetal monitoring: FHR: 125 bpm, Variability: moderate, Accelerations: Present, Decelerations: Absent  Uterine activity: none   Results for orders placed or performed during the hospital encounter of 03/24/24 (from the past 48 hours)  Protein / creatinine ratio, urine     Status: Abnormal   Collection Time: 03/24/24  3:16 PM  Result Value Ref Range   Creatinine, Urine 238 mg/dL    Total Protein, Urine 150 mg/dL    Comment: NO NORMAL RANGE ESTABLISHED FOR THIS TEST   Protein Creatinine Ratio 0.63 (H) 0.00 - 0.15 mg/mg[Cre]    Comment: Performed at [facility], [address] [place], [place] [address]  Urinalysis, Routine w reflex microscopic -Urine, Clean Catch     Status: Abnormal   Collection Time: 03/24/24  3:29 PM  Result Value Ref Range   Color, Urine YELLOW YELLOW   APPearance HAZY (A) CLEAR   Specific Gravity, Urine 1.031 (H) 1.005 - 1.030   pH 5.0 5.0 - 8.0   Glucose, UA 50 (A) NEGATIVE mg/dL   Hgb urine dipstick NEGATIVE NEGATIVE   Bilirubin Urine NEGATIVE NEGATIVE   Ketones, ur NEGATIVE NEGATIVE mg/dL   Protein, ur >=699 (A) NEGATIVE mg/dL   Nitrite NEGATIVE NEGATIVE   Leukocytes,Ua NEGATIVE NEGATIVE   RBC / HPF 0-5 0 - 5 RBC/hpf   WBC, UA 0-5 0 - 5 WBC/hpf   Bacteria, UA RARE (A) NONE SEEN   Squamous Epithelial / HPF 0-5 0 - 5 /HPF   Mucus PRESENT     Comment: Performed at [facility], [address], [place], [place] [address]  CBC     Status: Abnormal   Collection Time: 03/24/24  3:35 PM  Result Value Ref Range   WBC 12.1 (H) 4.0 - 10.5 K/uL   RBC 4.05 3.87 - 5.11 MIL/uL   Hemoglobin 11.7 (L) 12.0 - 15.0 g/dL   HCT 64.3 (L) 63.9 - 53.9 %   MCV 87.9 80.0 - 100.0 fL   MCH 28.9 26.0 - 34.0 pg   MCHC 32.9 30.0 - 36.0 g/dL   RDW 87.3 88.4 - 84.4 %   Platelets 268 150 -  400 K/uL   nRBC 0.0 0.0 - 0.2 %    Comment: Performed at [facility], [address], [place], [place] [address]  Comprehensive metabolic panel     Status: Abnormal   Collection Time: 03/24/24  3:35 PM  Result Value Ref Range   Sodium 137 135 - 145 mmol/L   Potassium 4.3 3.5 - 5.1 mmol/L   Chloride 104 98 - 111 mmol/L   CO2 22 22 - 32 mmol/L   Glucose, Bld 121 (H) 70 - 99 mg/dL    Comment: Glucose reference range applies only to samples taken after fasting for at least 8 hours.   BUN 15 6 - 20 mg/dL   Creatinine, Ser 9.36 0.44 - 1.00 mg/dL   Calcium  8.8 (L) 8.9 - 10.3 mg/dL   Total Protein 5.8 (L) 6.5 - 8.1 g/dL   Albumin 2.4 (L) 3.5 - 5.0 g/dL   AST 20 15 - 41 U/L   ALT 21 0 - 44 U/L   Alkaline Phosphatase 52 38 - 126 U/L   Total Bilirubin 0.2 0.0 - 1.2 mg/dL   GFR, Estimated >39 >39 mL/min    Comment: (NOTE) Calculated using the CKD-EPI Creatinine Equation (2021)    Anion gap 11 5 - 15    Comment: Performed at [facility], [address] [place], [place] [address]  Glucose, capillary     Status: Abnormal   Collection Time: 03/24/24 10:59 PM  Result Value Ref Range   Glucose-Capillary 211 (H) 70 - 99 mg/dL    Comment: Glucose reference range applies only to samples taken after fasting for at least 8 hours.  Type and screen     Status: None   Collection Time: 03/25/24  5:02 AM  Result Value Ref Range   ABO/RH(D) A POS    Antibody Screen NEG    Sample Expiration      03/28/2024,2359 Performed at [facility], [address], [place], [place] [address]   Glucose, capillary     Status: Abnormal   Collection Time: 03/25/24  7:49 AM  Result Value Ref Range   Glucose-Capillary 116 (H) 70 - 99 mg/dL    Comment: Glucose reference range applies only to samples taken after fasting for at least 8 hours.    I have reviewed the patient's current medications.  ASSESSMENT: Principal Problem:   Chronic hypertension with superimposed preeclampsia Active Problems:   Severe pre-eclampsia   IUGR (intrauterine growth restriction) affecting care of mother   PLAN: Fetal well being - NST reassuring, BPP from today with dopplers is pending; however briefly spoke with MFM who noted intermittent AEDF. -testing to now be BID NST with M/TH BPP with dopplers -delivery sooner if reversed flow or uncontrolled BP, but for now inpatient management with expected delivery at 34 weeks  2. Superimposed Severe preeclampsia over [facility] Inpatient monitoring and BP control Delivery at 34 weeks pending testing  3.  Type 2 diabetes in  pregnancy - 22 units semglee  added to regimen - Metformin  1000 mg BID - Regular insulin  20 units TID with meal -appreciate inpatient diabetes management team  4. FGR - now with IAEDF Awaiting EFW from today's scan Testing as previous   Continue routine antenatal care.   Jerilynn Buddle, MD [facility] Faculty Attending, Center for [facility] 03/25/2024 12:27 PM

## 2024-03-25 NOTE — Inpatient Diabetes Management (Signed)
 Inpatient Diabetes Program Recommendations  AACE/ADA: New Consensus Statement on Inpatient Glycemic Control (2015)  Target Ranges:  Prepandial:   less than 140 mg/dL      Peak postprandial:   less than 180 mg/dL (1-2 hours)      Critically ill patients:  140 - 180 mg/dL   Lab Results  Component Value Date   GLUCAP 116 (H) 03/25/2024   HGBA1C 6.8 (H) 01/01/2024    Review of Glycemic Control  Latest Reference Range & Units 03/24/24 22:59 03/25/24 07:49  Glucose-Capillary 70 - 99 mg/dL 788 (H) 883 (H)  (H): Data is abnormally high  Diabetes history: DM2 Outpatient Diabetes medications: Tresiba 25 units at bedtime, Novolog  22 units TID with meals, Metformin  1000 mg BID, Dexcom G7  Prior to pregnancy: Mounjaro weekly, Farxiga every day, Metformin  1000 mg BID.   Current orders for Inpatient glycemic control: Novolog  20 units TID with meals, Novolog  0-15 units TID, Metformin  1000 mg BID  Received first dose of BMZ on 10/22 and second dose ordered for today.  Please consider Semglee  22 units every day (home dose)  Referral received for DM Peripartum Management.  Met with patient at bedside.  She confirms above home DM medications.  She did not take [place] last night as she was admitted yesterday.  Fasting was 116 mg/dL this am.  She received Novolog  20 units for breakfast and Metformin .  Her Dexcom G7 ran out yesterday.  Will bring her a sample G7 to wear.  Spoke with Alan, RN and notified her of CGM placement.  Dr. Zina also aware.    She states that prior to pregnancy when she was taking the GLP, Metformin  and SGLT2i her glucose trends were terrible.  May need insulin  postpartum.    Thank you, Wyvonna Pinal, MSN, CDC[facility] Diabetes Coordinator Inpatient Diabetes Program [phone] (team pager from 8a-5p)

## 2024-03-25 NOTE — Plan of Care (Signed)
  Problem: Education: Goal: Knowledge of disease or condition will improve Outcome: Progressing Goal: Knowledge of the prescribed therapeutic regimen will improve Outcome: Progressing   Problem: Fluid Volume: Goal: Peripheral tissue perfusion will improve Outcome: Progressing   Problem: Clinical Measurements: Goal: Complications related to disease process, condition or treatment will be avoided or minimized Outcome: Progressing   Problem: Health Behavior/Discharge Planning: Goal: Ability to manage health-related needs will improve Outcome: Progressing   Problem: Clinical Measurements: Goal: Ability to maintain clinical measurements within normal limits will improve Outcome: Progressing Goal: Will remain free from infection Outcome: Progressing Goal: Diagnostic test results will improve Outcome: Progressing Goal: Respiratory complications will improve Outcome: Progressing Goal: Cardiovascular complication will be avoided Outcome: Progressing   Problem: Activity: Goal: Risk for activity intolerance will decrease Outcome: Progressing   Problem: Nutrition: Goal: Adequate nutrition will be maintained Outcome: Progressing   Problem: Coping: Goal: Level of anxiety will decrease Outcome: Progressing   Problem: Elimination: Goal: Will not experience complications related to bowel motility Outcome: Progressing Goal: Will not experience complications related to urinary retention Outcome: Progressing   Problem: Pain Managment: Goal: General experience of comfort will improve and/or be controlled Outcome: Progressing   Problem: Safety: Goal: Ability to remain free from injury will improve Outcome: Progressing   Problem: Skin Integrity: Goal: Risk for impaired skin integrity will decrease Outcome: Progressing   Problem: Education: Goal: Knowledge of disease or condition will improve Outcome: Progressing Goal: Knowledge of the prescribed therapeutic regimen will  improve Outcome: Progressing Goal: Individualized Educational Video(s) Outcome: Progressing   Problem: Clinical Measurements: Goal: Complications related to the disease process, condition or treatment will be avoided or minimized Outcome: Progressing   Problem: Education: Goal: Ability to describe self-care measures that may prevent or decrease complications (Diabetes Survival Skills Education) will improve Outcome: Progressing Goal: Individualized Educational Video(s) Outcome: Progressing   Problem: Coping: Goal: Ability to adjust to condition or change in health will improve Outcome: Progressing   Problem: Fluid Volume: Goal: Ability to maintain a balanced intake and output will improve Outcome: Progressing   Problem: Health Behavior/Discharge Planning: Goal: Ability to identify and utilize available resources and services will improve Outcome: Progressing Goal: Ability to manage health-related needs will improve Outcome: Progressing   Problem: Metabolic: Goal: Ability to maintain appropriate glucose levels will improve Outcome: Progressing   Problem: Nutritional: Goal: Maintenance of adequate nutrition will improve Outcome: Progressing Goal: Progress toward achieving an optimal weight will improve Outcome: Progressing   Problem: Skin Integrity: Goal: Risk for impaired skin integrity will decrease Outcome: Progressing   Problem: Tissue Perfusion: Goal: Adequacy of tissue perfusion will improve Outcome: Progressing

## 2024-03-26 ENCOUNTER — Ambulatory Visit: Admitting: Pharmacist

## 2024-03-26 LAB — GLUCOSE, CAPILLARY
Glucose-Capillary: 111 mg/dL — ABNORMAL HIGH (ref 70–99)
Glucose-Capillary: 120 mg/dL — ABNORMAL HIGH (ref 70–99)
Glucose-Capillary: 136 mg/dL — ABNORMAL HIGH (ref 70–99)

## 2024-03-26 NOTE — Progress Notes (Signed)
 FACULTY PRACTICE ANTEPARTUM PROGRESS NOTE  Summer Campbell is a 33 y.o. G2P0010 at 104w3d who is admitted for superimposed preeclampsia over chronic hypertension..  Estimated Date of Delivery: 06/01/24 Fetal presentation is cephalic.  Length of Stay:  2 Days. Admitted 03/24/2024  Subjective: Pt is doing well.  She denies headache, visual changes, and ruq pain.2 Patient reports normal fetal movement.  She denies uterine contractions,  denies bleeding and leaking of fluid per vagina.  Vitals:  Blood pressure (!) 155/79, pulse 86, temperature 97.8 F (36.6 C), temperature source Oral, resp. rate 18, height 5' 4 (1.626 m), weight (!) 152.5 kg, SpO2 98%. Physical Examination: CONSTITUTIONAL: Well-developed, obese, well-nourished female in no acute distress.  HENT:  Normocephalic, atraumatic, External right and left ear normal. Oropharynx is clear and moist EYES: Conjunctivae and EOM are normal.  NECK: Normal range of motion, supple, no masses. SKIN: Skin is warm and dry. No rash noted. Not diaphoretic. No erythema. No pallor. NEUROLGIC: Alert and oriented to person, place, and time. Normal reflexes, muscle tone coordination. No cranial nerve deficit noted. PSYCHIATRIC: Normal mood and affect. Normal behavior. Normal judgment and thought content. CARDIOVASCULAR: Normal heart rate noted, regular rhythm RESPIRATORY: Effort and breath sounds normal, no problems with respiration noted MUSCULOSKELETAL: Normal range of motion. No edema and no tenderness. ABDOMEN: Soft, nontender, nondistended, gravid. CERVIX: deferred  Fetal monitoring: FHR: 135 bpm, Variability: moderate, Accelerations: Present, Decelerations: occasional sporadic variable decel  Uterine activity: irritabilty  Results for orders placed or performed during the hospital encounter of 03/24/24 (from the past 48 hours)  Protein / creatinine ratio, urine     Status: Abnormal   Collection Time: 03/24/24  3:16 PM  Result Value Ref Range    Creatinine, Urine 238 mg/dL   Total Protein, Urine 150 mg/dL    Comment: NO NORMAL RANGE ESTABLISHED FOR THIS TEST   Protein Creatinine Ratio 0.63 (H) 0.00 - 0.15 mg/mg[Cre]    Comment: Performed at [facility], [address] [place], [place] [address]  Urinalysis, Routine w reflex microscopic -Urine, Clean Catch     Status: Abnormal   Collection Time: 03/24/24  3:29 PM  Result Value Ref Range   Color, Urine YELLOW YELLOW   APPearance HAZY (A) CLEAR   Specific Gravity, Urine 1.031 (H) 1.005 - 1.030   pH 5.0 5.0 - 8.0   Glucose, UA 50 (A) NEGATIVE mg/dL   Hgb urine dipstick NEGATIVE NEGATIVE   Bilirubin Urine NEGATIVE NEGATIVE   Ketones, ur NEGATIVE NEGATIVE mg/dL   Protein, ur >=699 (A) NEGATIVE mg/dL   Nitrite NEGATIVE NEGATIVE   Leukocytes,Ua NEGATIVE NEGATIVE   RBC / HPF 0-5 0 - 5 RBC/hpf   WBC, UA 0-5 0 - 5 WBC/hpf   Bacteria, UA RARE (A) NONE SEEN   Squamous Epithelial / HPF 0-5 0 - 5 /HPF   Mucus PRESENT     Comment: Performed at [facility], [address] [place], [place] [address]  CBC     Status: Abnormal   Collection Time: 03/24/24  3:35 PM  Result Value Ref Range   WBC 12.1 (H) 4.0 - 10.5 K/uL   RBC 4.05 3.87 - 5.11 MIL/uL   Hemoglobin 11.7 (L) 12.0 - 15.0 g/dL   HCT 64.3 (L) 63.9 - 53.9 %   MCV 87.9 80.0 - 100.0 fL   MCH 28.9 26.0 - 34.0 pg   MCHC 32.9 30.0 - 36.0 g/dL   RDW 87.3 88.4 - 84.4 %  Platelets 268 150 - 400 K/uL   nRBC 0.0 0.0 - 0.2 %    Comment: Performed at [facility], [address] [place], [place] [address]  Comprehensive metabolic panel     Status: Abnormal   Collection Time: 03/24/24  3:35 PM  Result Value Ref Range   Sodium 137 135 - 145 mmol/L   Potassium 4.3 3.5 - 5.1 mmol/L   Chloride 104 98 - 111 mmol/L   CO2 22 22 - 32 mmol/L   Glucose, Bld 121 (H) 70 - 99 mg/dL    Comment: Glucose reference range applies only to samples taken after fasting for at least 8 hours.   BUN 15 6 - 20 mg/dL   Creatinine, Ser  9.36 0.44 - 1.00 mg/dL   Calcium 8.8 (L) 8.9 - 10.3 mg/dL   Total Protein 5.8 (L) 6.5 - 8.1 g/dL   Albumin 2.4 (L) 3.5 - 5.0 g/dL   AST 20 15 - 41 U/L   ALT 21 0 - 44 U/L   Alkaline Phosphatase 52 38 - 126 U/L   Total Bilirubin 0.2 0.0 - 1.2 mg/dL   GFR, Estimated >39 >39 mL/min    Comment: (NOTE) Calculated using the CKD-EPI Creatinine Equation (2021)    Anion gap 11 5 - 15    Comment: Performed at [facility], [address], [place], [place] [address]  Glucose, capillary     Status: Abnormal   Collection Time: 03/24/24 10:59 PM  Result Value Ref Range   Glucose-Capillary 211 (H) 70 - 99 mg/dL    Comment: Glucose reference range applies only to samples taken after fasting for at least 8 hours.  Type and screen     Status: None   Collection Time: 03/25/24  5:02 AM  Result Value Ref Range   ABO/RH(D) A POS    Antibody Screen NEG    Sample Expiration      03/28/2024,2359 Performed at [facility], [address], [place], [place] [address]   Glucose, capillary     Status: Abnormal   Collection Time: 03/25/24  7:49 AM  Result Value Ref Range   Glucose-Capillary 116 (H) 70 - 99 mg/dL    Comment: Glucose reference range applies only to samples taken after fasting for at least 8 hours.  Glucose, capillary     Status: Abnormal   Collection Time: 03/25/24  6:28 PM  Result Value Ref Range   Glucose-Capillary 65 (L) 70 - 99 mg/dL    Comment: Glucose reference range applies only to samples taken after fasting for at least 8 hours.  Glucose, capillary     Status: Abnormal   Collection Time: 03/25/24 10:16 PM  Result Value Ref Range   Glucose-Capillary 190 (H) 70 - 99 mg/dL    Comment: Glucose reference range applies only to samples taken after fasting for at least 8 hours.  Glucose, capillary     Status: Abnormal   Collection Time: 03/26/24  6:38 AM  Result Value Ref Range   Glucose-Capillary 111 (H) 70 - 99 mg/dL    Comment: Glucose reference range applies only to  samples taken after fasting for at least 8 hours.    I have reviewed the patient's current medications.  ASSESSMENT: Principal Problem:   Chronic hypertension with superimposed preeclampsia Active Problems:   Severe pre-eclampsia   IUGR (intrauterine growth restriction) affecting care of mother   PLAN:  Fetal well being    -category 1 strip today    -  BID NST M/TH BPP    - delivery at 34 weeks or as clinically indicated  2.   Superimposed severe preeclampsia over CHTN  3. Type 2 diabetes in pregnancy      - report of hypoglycemia overnight, pt currently using sliding scale as needed  4. FGR      - IAEDF noted on last dopplers     - testing as previous     - EFW at 8%   Continue routine antenatal care.   Jerilynn Buddle, MD [facility] Faculty Attending, Center for [facility] 03/26/2024 3:05 PM

## 2024-03-27 ENCOUNTER — Encounter: Payer: Self-pay | Admitting: Family Medicine

## 2024-03-27 DIAGNOSIS — O113 Pre-existing hypertension with pre-eclampsia, third trimester: Secondary | ICD-10-CM | POA: Diagnosis not present

## 2024-03-27 DIAGNOSIS — Z3A30 30 weeks gestation of pregnancy: Secondary | ICD-10-CM | POA: Diagnosis not present

## 2024-03-27 LAB — COMPREHENSIVE METABOLIC PANEL WITH GFR
ALT: 39 U/L (ref 0–44)
AST: 28 U/L (ref 15–41)
Albumin: 2.4 g/dL — ABNORMAL LOW (ref 3.5–5.0)
Alkaline Phosphatase: 50 U/L (ref 38–126)
Anion gap: 10 (ref 5–15)
BUN: 19 mg/dL (ref 6–20)
CO2: 22 mmol/L (ref 22–32)
Calcium: 8.4 mg/dL — ABNORMAL LOW (ref 8.9–10.3)
Chloride: 103 mmol/L (ref 98–111)
Creatinine, Ser: 0.56 mg/dL (ref 0.44–1.00)
GFR, Estimated: >60 mL/min (ref >60)
Glucose, Bld: 86 mg/dL (ref 70–99)
Potassium: 4.4 mmol/L (ref 3.5–5.1)
Sodium: 135 mmol/L (ref 135–145)
Total Bilirubin: 0.3 mg/dL (ref 0.0–1.2)
Total Protein: 5.6 g/dL — ABNORMAL LOW (ref 6.5–8.1)

## 2024-03-27 LAB — CBC
HCT: 34.1 % — ABNORMAL LOW (ref 36.0–46.0)
Hemoglobin: 11.2 g/dL — ABNORMAL LOW (ref 12.0–15.0)
MCH: 29.6 pg (ref 26.0–34.0)
MCHC: 32.8 g/dL (ref 30.0–36.0)
MCV: 90 fL (ref 80.0–100.0)
Platelets: 256 K/uL (ref 150–400)
RBC: 3.79 MIL/uL — ABNORMAL LOW (ref 3.87–5.11)
RDW: 13.2 % (ref 11.5–15.5)
WBC: 13.9 K/uL — ABNORMAL HIGH (ref 4.0–10.5)
nRBC: 0 % (ref 0.0–0.2)

## 2024-03-27 LAB — GLUCOSE, CAPILLARY
Glucose-Capillary: 74 mg/dL (ref 70–99)
Glucose-Capillary: 74 mg/dL (ref 70–99)
Glucose-Capillary: 124 mg/dL — ABNORMAL HIGH (ref 70–99)
Glucose-Capillary: 61 mg/dL — ABNORMAL LOW (ref 70–99)
Glucose-Capillary: 62 mg/dL — ABNORMAL LOW (ref 70–99)
Glucose-Capillary: 104 mg/dL — ABNORMAL HIGH (ref 70–99)
Glucose-Capillary: 60 mg/dL — ABNORMAL LOW (ref 70–99)

## 2024-03-27 LAB — TYPE AND SCREEN
ABO/RH(D): A POS
Antibody Screen: NEGATIVE

## 2024-03-27 MED ORDER — GLUCAGON HCL RDNA (DIAGNOSTIC) 1 MG IJ SOLR
1.0000 mg | Freq: Once | INTRAMUSCULAR | Status: AC | PRN
  Administered 2024-03-27: 1 mg via INTRAVENOUS
  Filled 2024-03-27: qty 1

## 2024-03-27 MED ORDER — LABETALOL HCL 200 MG PO TABS
600.0000 mg | ORAL_TABLET | Freq: Three times a day (TID) | ORAL | Status: DC
  Administered 2024-03-27 (×2): 600 mg via ORAL
  Filled 2024-03-27 (×2): qty 3

## 2024-03-27 NOTE — Progress Notes (Signed)
 In Report from Day shift I was to repeat cbg with dexcom at  8pm. Dexcom read 51 PT did not have any hypoglycemic symptoms,  snack was given , RN did a finger stick and it was 62 at 813pm, snack was given againand finger stick was repeated and  it was 60. Dr Fredirick was notified and an iv glucagon order was given. CBG repeated and it was 104. Will continue to monitor pt closely.

## 2024-03-27 NOTE — Consult Note (Signed)
 [facility]  Prenatal Consult       03/27/2024  7:02 PM   I was asked by Dr. Elvera team to consult on this patient for possible preterm delivery.  I had the pleasure of meeting with Summer Campbell today.  She is a G2P0010 at 30+4 with pregnancy complicated by CHTN, DM2, BMI 50, FGR.  She is being monitored inpatient due to superimposed severe pre-eclampsia.  Plan to delivery at [redacted] weeks gestation or earlier if clinically indicated.   I explained that the neonatal intensive care team would be present for the delivery and outlined the likely delivery room course for this baby including routine resuscitation and NRP-guided approaches to the treatment of respiratory distress. We discussed other common problems associated with prematurity including respiratory distress syndrome/CLD, apnea, feeding issues, temperature regulation, and infection risk.    We discussed the average length of stay but I noted that the actual LOS would depend on the severity of problems encountered and response to treatments.  We discussed visitation policies and the resources available while her child is in the hospital.  We discussed the importance of good nutrition and various methods of providing nutrition (parenteral hyperalimentation, gavage feedings and/or oral feeding). We discussed the benefits of human milk. I encouraged breast feeding and pumping soon after birth and outlined resources that are available to support breast feeding.  She plans to provide breast milk.    Thank you for involving us  in the care of this patient. A member of our team will be available should the family have additional questions.  Time for consultation approximately 20 minutes.   _____________________ Electronically Signed By: Schuyler Carpen, MD, Summer Neonatologist

## 2024-03-27 NOTE — Progress Notes (Signed)
 FACULTY PRACTICE ANTEPARTUM(COMPREHENSIVE) NOTE  LAKIYAH ARNTSON is a 33 y.o. G2P0010 with Estimated Date of Delivery: 06/01/24   By  early ultrasound [redacted]w[redacted]d  who is admitted for chronic HTN with superimposed preE.    Fetal presentation is cephalic. Length of Stay:  3  Days  Date of admission:03/24/2024  Subjective: Resting comfortably in bed, no acute complaints.  No headache, no blurry vision, no RUQ pain. Patient reports the fetal movement as active. Patient reports uterine contraction  activity as none. Patient reports  vaginal bleeding as none. Patient describes fluid per vagina as None.  Vitals:  Blood pressure (!) 157/78, pulse 83, temperature 98.1 F (36.7 C), temperature source Oral, resp. rate 19, height 5' 4 (1.626 m), weight (!) 152.5 kg, SpO2 98%. Vitals:   03/26/24 1934 03/26/24 2323 03/27/24 0426 03/27/24 1219  BP: (!) 154/80 (!) 140/75 (!) 151/63 (!) 157/78  Pulse: 81 86 80 83  Resp: 18 17 17 19   Temp:  98.1 F (36.7 C) 97.7 F (36.5 C) 98.1 F (36.7 C)  TempSrc:  Oral Oral Oral  SpO2:  97% 98% 98%  Weight:      Height:       Physical Examination:  General appearance - alert, well appearing, and in no distress Mental status - normal mood, behavior, speech, dress, motor activity, and thought processes Chest - clear to auscultation, no wheezes, rales or rhonchi, symmetric air entry Heart - normal rate and regular rhythm Abdomen - obese, soft and gravid, no rebound, no guarding Extremities - no edema, no calf tenderness bilaterally Skin - warm and dry   Fetal Monitoring:  Baseline: 140 bpm, Variability: moderate, Accelerations: +10x10 x 2, and Decelerations: Absent    reactive  Labs:  Results for orders placed or performed during the hospital encounter of 03/24/24 (from the past 24 hours)  Glucose, capillary   Collection Time: 03/26/24  3:40 PM  Result Value Ref Range   Glucose-Capillary 120 (H) 70 - 99 mg/dL  Glucose, capillary   Collection Time:  03/26/24  9:42 PM  Result Value Ref Range   Glucose-Capillary 136 (H) 70 - 99 mg/dL  CBC   Collection Time: 03/27/24  4:31 AM  Result Value Ref Range   WBC 13.9 (H) 4.0 - 10.5 K/uL   RBC 3.79 (L) 3.87 - 5.11 MIL/uL   Hemoglobin 11.2 (L) 12.0 - 15.0 g/dL   HCT 65.8 (L) 63.9 - 53.9 %   MCV 90.0 80.0 - 100.0 fL   MCH 29.6 26.0 - 34.0 pg   MCHC 32.8 30.0 - 36.0 g/dL   RDW 86.7 88.4 - 84.4 %   Platelets 256 150 - 400 K/uL   nRBC 0.0 0.0 - 0.2 %  Comprehensive metabolic panel   Collection Time: 03/27/24  4:31 AM  Result Value Ref Range   Sodium 135 135 - 145 mmol/L   Potassium 4.4 3.5 - 5.1 mmol/L   Chloride 103 98 - 111 mmol/L   CO2 22 22 - 32 mmol/L   Glucose, Bld 86 70 - 99 mg/dL   BUN 19 6 - 20 mg/dL   Creatinine, Ser 9.43 0.44 - 1.00 mg/dL   Calcium 8.4 (L) 8.9 - 10.3 mg/dL   Total Protein 5.6 (L) 6.5 - 8.1 g/dL   Albumin 2.4 (L) 3.5 - 5.0 g/dL   AST 28 15 - 41 U/L   ALT 39 0 - 44 U/L   Alkaline Phosphatase 50 38 - 126 U/L   Total Bilirubin 0.3 0.0 -  1.2 mg/dL   GFR, Estimated >39 >39 mL/min   Anion gap 10 5 - 15  Glucose, capillary   Collection Time: 03/27/24  7:01 AM  Result Value Ref Range   Glucose-Capillary 74 70 - 99 mg/dL  Glucose, capillary   Collection Time: 03/27/24 11:24 AM  Result Value Ref Range   Glucose-Capillary 74 70 - 99 mg/dL    Imaging Studies:    US  MFM OB FOLLOW UP Result Date: 03/25/2024 ----------------------------------------------------------------------  OBSTETRICS REPORT                       (Signed Final 03/25/2024 05:31 pm) ---------------------------------------------------------------------- Patient Info  ID #:       [id number]                          D.O.B.:  Sep 25, 1990 (32 yrs)(F)  Name:       HARLENE CROME Mustapha                 Visit Date: 03/25/2024 10:29 am ---------------------------------------------------------------------- Performed By  Attending:        Nathanel Fetters      Ref. Address:     [address]                    [place]                                                              [place], [place]                                                             [address]  Performed By:     Burnard CROME Custard          Secondary Phy.:   [facility]                    RDMS                                                             Care  Referred By:      BEBE                [facility] ---------------------------------------------------------------------- Orders  #  Description                           Code        Ordered By  1  US  MFM  OB FOLLOW UP                   [id number]    CHARLIE PICKENS  2  US  MFM UA CORD DOPPLER                76820.02    CHARLIE PICKENS  3  US  MFM FETAL BPP WO NON               76819.01    CHARLIE PICKENS     STRESS ----------------------------------------------------------------------  #  Order #                     Accession #                Episode #  1  [id number]                   [id number]                 [id number]  2  [id number]                   [id number]                 [id number]  3  [id number]                   [id number]                 [id number] ---------------------------------------------------------------------- Indications  Severe preeclampsia, third trimester           O14.13  [redacted] weeks gestation of pregnancy                Z3A.30  Pre-existing diabetes, type 2, in pregnancy,   O24.112  second trimester (Insulin )  Anxiety during pregnancy, second trimester     O99.342, F41.9  (Lexapro) ---------------------------------------------------------------------- Fetal Evaluation  Num Of Fetuses:         1  Fetal Heart Rate(bpm):  133  Cardiac Activity:       Observed  Presentation:           Cephalic  Placenta:               Posterior  Amniotic Fluid  AFI FV:      Within normal limits ---------------------------------------------------------------------- Biophysical Evaluation  Amniotic F.V:   Within normal limits        F. Tone:        Observed  F. Movement:    Observed                   Score:          8/8  F. Breathing:   Observed ---------------------------------------------------------------------- Biometry  BPD:      76.5  mm     G. Age:  30w 5d         51  %    CI:        78.66   %    70 - 86                                                          FL/HC:      19.6   %    19.2 - 21.4  HC:  272.8  mm     G. Age:  29w 5d          8  %    HC/AC:      1.10        0.99 - 1.21  AC:      248.2  mm     G. Age:  29w 0d         13  %    FL/BPD:     70.1   %    71 - 87  FL:       53.6  mm     G. Age:  28w 3d        3.5  %    FL/AC:      21.6   %    20 - 24  Est. FW:    1323  gm    2 lb 15 oz       8  % ---------------------------------------------------------------------- OB History  Blood Type:   A+  Gravidity:    2          SAB:   1  Living:       0 ---------------------------------------------------------------------- Gestational Age  U/S Today:     29w 3d                                        EDD:   06/07/24  Best:          30w 2d     Det. By:  Early Ultrasound         EDD:   06/01/24                                      (10/26/23) ---------------------------------------------------------------------- Anatomy  Cranium:               Previously seen        Aortic Arch:            Not well visualized  Cavum:                 Previously seen        Ductal Arch:            Previously seen  Ventricles:            Previously seen        Diaphragm:              Appears normal  Choroid Plexus:        Previously seen        Stomach:                Appears normal, left                                                                        sided  Cerebellum:            Not well visualized    Abdomen:                Previously seen  Posterior Fossa:       Not well visualized    Abdominal Wall:         Not well visualized  Face:                  Not well visualized    Cord Vessels:           Not well visualized  Lips:                  Not well  visualized    Kidneys:                Appear normal  Thoracic:              Previously seen        Bladder:                Appears normal  Heart:                 Not well visualized    Spine:                  Previously seen  RVOT:                  Not well visualized    Upper Extremities:      Previously seen  LVOT:                  Not well visualized    Lower Extremities:      Previously seen ---------------------------------------------------------------------- Doppler - Fetal Vessels  Umbilical Artery                                                              ADFV    RDFV                                                                Yes      No ---------------------------------------------------------------------- Cervix Uterus Adnexa  Cervix  Not visualized (advanced GA >24wks)  Adnexa  No abnormality visualized ---------------------------------------------------------------------- Impression  Follow up growth due to preeclampsia with severe features  Normal interval growth with measurements consistent with  fetal growth restriction  Good fetal movement and amniotic fluid volume  BPP 8/8  The UAD are intermittently absent with >50% demonstratng  AEDF but no evidence of REDF.  I discussed this case with Dr. Zina. recommend twice weekly  UAD/BPP with two times daily NST.  Repeat growth in 3 weeks  Plan for delivery at 34 week. ----------------------------------------------------------------------               Nathanel Fetters, MD Electronically Signed Final Report   03/25/2024 05:31 pm ----------------------------------------------------------------------   US  MFM UA CORD DOPPLER Result Date: 03/25/2024 ----------------------------------------------------------------------  OBSTETRICS REPORT                       (Signed Final 03/25/2024 05:31 pm) ---------------------------------------------------------------------- Patient Info  ID #:       [id number]  D.O.B.:  12-Jul-1990 (32  yrs)(F)  Name:       MILIANNA ERICSSON Carlucci                 Visit Date: 03/25/2024 10:29 am ---------------------------------------------------------------------- Performed By  Attending:        Nathanel Fetters      Ref. Address:     [address]                    [place]                                                             [place], [place]                                                             [address]  Performed By:     Burnard CROME Custard          Secondary Phy.:   [facility]                    RDMS                                                             Care  Referred By:      BEBE                [facility] [place]                               [facility] ---------------------------------------------------------------------- Orders  #  Description                           Code        Ordered By  1  US  MFM OB FOLLOW UP                   23183.98    CHARLIE PICKENS  2  US  MFM UA CORD DOPPLER                76820.02    CHARLIE PICKENS  3  US  MFM FETAL BPP WO NON               23180.98    CHARLIE PICKENS     STRESS ----------------------------------------------------------------------  #  Order #                     Accession #                Episode #  1  [id number]  [id number]                 [id number]  2  [id number]                   [id number]                 [id number]  3  [id number]                   [id number]                 [id number] ---------------------------------------------------------------------- Indications  Severe preeclampsia, third trimester           O14.13  [redacted] weeks gestation of pregnancy                Z3A.30  Pre-existing diabetes, type 2, in pregnancy,   O24.112  second trimester (Insulin )  Anxiety during pregnancy, second trimester     O99.342, F41.9  (Lexapro) ---------------------------------------------------------------------- Fetal Evaluation  Num Of Fetuses:         1  Fetal Heart Rate(bpm):  133  Cardiac Activity:        Observed  Presentation:           Cephalic  Placenta:               Posterior  Amniotic Fluid  AFI FV:      Within normal limits ---------------------------------------------------------------------- Biophysical Evaluation  Amniotic F.V:   Within normal limits       F. Tone:        Observed  F. Movement:    Observed                   Score:          8/8  F. Breathing:   Observed ---------------------------------------------------------------------- Biometry  BPD:      76.5  mm     G. Age:  30w 5d         51  %    CI:        78.66   %    70 - 86                                                          FL/HC:      19.6   %    19.2 - 21.4  HC:      272.8  mm     G. Age:  29w 5d          8  %    HC/AC:      1.10        0.99 - 1.21  AC:      248.2  mm     G. Age:  29w 0d         13  %    FL/BPD:     70.1   %    71 - 87  FL:       53.6  mm     G. Age:  28w 3d        3.5  %    FL/AC:      21.6   %    20 - 24  Est. FW:    1323  gm    2 lb  15 oz       8  % ---------------------------------------------------------------------- OB History  Blood Type:   A+  Gravidity:    2          SAB:   1  Living:       0 ---------------------------------------------------------------------- Gestational Age  U/S Today:     29w 3d                                        EDD:   06/07/24  Best:          30w 2d     Det. By:  Early Ultrasound         EDD:   06/01/24                                      (10/26/23) ---------------------------------------------------------------------- Anatomy  Cranium:               Previously seen        Aortic Arch:            Not well visualized  Cavum:                 Previously seen        Ductal Arch:            Previously seen  Ventricles:            Previously seen        Diaphragm:              Appears normal  Choroid Plexus:        Previously seen        Stomach:                Appears normal, left                                                                        sided  Cerebellum:            Not well  visualized    Abdomen:                Previously seen  Posterior Fossa:       Not well visualized    Abdominal Wall:         Not well visualized  Face:                  Not well visualized    Cord Vessels:           Not well visualized  Lips:                  Not well visualized    Kidneys:                Appear normal  Thoracic:              Previously seen        Bladder:                Appears normal  Heart:                 Not well visualized    Spine:                  Previously seen  RVOT:                  Not well visualized    Upper Extremities:      Previously seen  LVOT:                  Not well visualized    Lower Extremities:      Previously seen ---------------------------------------------------------------------- Doppler - Fetal Vessels  Umbilical Artery                                                              ADFV    RDFV                                                                Yes      No ---------------------------------------------------------------------- Cervix Uterus Adnexa  Cervix  Not visualized (advanced GA >24wks)  Adnexa  No abnormality visualized ---------------------------------------------------------------------- Impression  Follow up growth due to preeclampsia with severe features  Normal interval growth with measurements consistent with  fetal growth restriction  Good fetal movement and amniotic fluid volume  BPP 8/8  The UAD are intermittently absent with >50% demonstratng  AEDF but no evidence of REDF.  I discussed this case with Dr. Zina. recommend twice weekly  UAD/BPP with two times daily NST.  Repeat growth in 3 weeks  Plan for delivery at 34 week. ----------------------------------------------------------------------               Nathanel Fetters, MD Electronically Signed Final Report   03/25/2024 05:31 pm ----------------------------------------------------------------------   US  MFM FETAL BPP WO NON STRESS Result Date:  03/25/2024 ----------------------------------------------------------------------  OBSTETRICS REPORT                       (Signed Final 03/25/2024 05:31 pm) ---------------------------------------------------------------------- Patient Info  ID #:       [id number]                          D.O.B.:  1991/03/27 (32 yrs)(F)  Name:       HARLENE CROME Caltagirone                 Visit Date: 03/25/2024 10:29 am ---------------------------------------------------------------------- Performed By  Attending:        Nathanel Fetters      Ref. Address:     [address]                    [place]  [place], [place]                                                             [address]  Performed By:     Burnard LITTIE Custard          Secondary Phy.:   [facility]                    RDMS                                                             Care  Referred By:      BEBE                [facility]                               [facility] ---------------------------------------------------------------------- Orders  #  Description                           Code        Ordered By  1  US  MFM OB FOLLOW UP                   76816.01    CHARLIE PICKENS  2  US  MFM UA CORD DOPPLER                76820.02    CHARLIE PICKENS  3  US  MFM FETAL BPP WO NON               76819.01    CHARLIE PICKENS     STRESS ----------------------------------------------------------------------  #  Order #                     Accession #                Episode #  1  [id number]                   [id number]                 [id number]  2  [id number]                   [id number]                 [id number]  3  [id number]                   [id number]                 [id number] ---------------------------------------------------------------------- Indications  Severe preeclampsia, third trimester           O14.13  [redacted] weeks gestation of pregnancy                Z3A.30  Pre-existing diabetes, type 2, in pregnancy,   O24.112  second trimester (Insulin )  Anxiety during pregnancy, second trimester     O99.342, F41.9  (Lexapro) ---------------------------------------------------------------------- Fetal Evaluation  Num Of Fetuses:         1  Fetal Heart Rate(bpm):  133  Cardiac Activity:       Observed  Presentation:           Cephalic  Placenta:               Posterior  Amniotic Fluid  AFI FV:      Within normal limits ---------------------------------------------------------------------- Biophysical Evaluation  Amniotic F.V:   Within normal limits       F. Tone:        Observed  F. Movement:    Observed                   Score:          8/8  F. Breathing:   Observed ---------------------------------------------------------------------- Biometry  BPD:      76.5  mm     G. Age:  30w 5d         51  %    CI:        78.66   %    70 - 86                                                          FL/HC:      19.6   %    19.2 - 21.4  HC:      272.8  mm     G. Age:  29w 5d          8  %    HC/AC:      1.10        0.99 - 1.21  AC:      248.2  mm     G. Age:  29w 0d         13  %    FL/BPD:     70.1   %    71 - 87  FL:       53.6  mm     G. Age:  28w 3d        3.5  %    FL/AC:      21.6   %    20 - 24  Est. FW:    1323  gm    2 lb 15 oz       8  % ---------------------------------------------------------------------- OB History  Blood Type:   A+  Gravidity:    2          SAB:   1  Living:       0 ---------------------------------------------------------------------- Gestational Age  U/S Today:     29w 3d                                        EDD:   06/07/24  Best:          30w 2d     Det. By:  Early Ultrasound         EDD:   06/01/24                                      (  10/26/23) ---------------------------------------------------------------------- Anatomy  Cranium:               Previously seen        Aortic Arch:            Not well visualized  Cavum:                 Previously seen         Ductal Arch:            Previously seen  Ventricles:            Previously seen        Diaphragm:              Appears normal  Choroid Plexus:        Previously seen        Stomach:                Appears normal, left                                                                        sided  Cerebellum:            Not well visualized    Abdomen:                Previously seen  Posterior Fossa:       Not well visualized    Abdominal Wall:         Not well visualized  Face:                  Not well visualized    Cord Vessels:           Not well visualized  Lips:                  Not well visualized    Kidneys:                Appear normal  Thoracic:              Previously seen        Bladder:                Appears normal  Heart:                 Not well visualized    Spine:                  Previously seen  RVOT:                  Not well visualized    Upper Extremities:      Previously seen  LVOT:                  Not well visualized    Lower Extremities:      Previously seen ---------------------------------------------------------------------- Doppler - Fetal Vessels  Umbilical Artery  ADFV    RDFV                                                                Yes      No ---------------------------------------------------------------------- Cervix Uterus Adnexa  Cervix  Not visualized (advanced GA >24wks)  Adnexa  No abnormality visualized ---------------------------------------------------------------------- Impression  Follow up growth due to preeclampsia with severe features  Normal interval growth with measurements consistent with  fetal growth restriction  Good fetal movement and amniotic fluid volume  BPP 8/8  The UAD are intermittently absent with >50% demonstratng  AEDF but no evidence of REDF.  I discussed this case with Dr. Zina. recommend twice weekly  UAD/BPP with two times daily NST.  Repeat growth in 3 weeks  Plan for delivery at 34  week. ----------------------------------------------------------------------               Nathanel Fetters, MD Electronically Signed Final Report   03/25/2024 05:31 pm ----------------------------------------------------------------------     Medications:  Scheduled  aspirin   162 mg Oral Daily   bisacodyl  5 mg Oral Once   enoxaparin (LOVENOX) injection  80 mg Subcutaneous Q24H   famotidine  20 mg Oral BID   insulin  aspart  0-15 Units Subcutaneous TID PC   insulin  aspart  20 Units Subcutaneous TID WC   insulin  glargine-yfgn  22 Units Subcutaneous Daily   labetalol   600 mg Oral TID   metFORMIN   1,000 mg Oral BID WC   NIFEdipine   60 mg Oral BID   polyethylene glycol  17 g Oral QODAY   prenatal multivitamin  1 tablet Oral Q1200   valACYclovir  500 mg Oral BID   I have reviewed the patient's current medications.  ASSESSMENT: G2P0010 [redacted]w[redacted]d Estimated Date of Delivery: 06/01/24  Patient Active Problem List   Diagnosis Date Noted   IUGR (intrauterine growth restriction) affecting care of mother 03/25/2024   Chronic hypertension with superimposed preeclampsia 03/24/2024   Severe pre-eclampsia 03/24/2024   Preexisting diabetes complicating pregnancy, antepartum 01/01/2024   Preexisting hypertension complicating pregnancy, antepartum 01/01/2024   Supervision of high risk pregnancy, antepartum 12/18/2023   Maternal morbid obesity, antepartum (HCC) 12/18/2023   LGSIL on Pap smear of cervix 06/21/2022   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/24/2020   Hyperlipidemia associated with type 2 diabetes mellitus (HCC) 10/24/2020   Hypertension associated with diabetes (HCC) 10/24/2020   Type 2 diabetes mellitus without complication, with long-term current use of insulin  (HCC) 10/24/2020    PLAN: 1) Superimposed preeclampsia -plan to increase Labetalol  600mg  TID, continue Procardia  XL 60mg  BID -pt asymptomatic -labs q 72hr, remain stable  2) Fetal well being, FGR -reactive NST,  appropriate for current gestational age -continue daily monitoring -growth every 3-4wks, last completed 10/23- vertex/EFW 1323g (8%) with intermittent AEDF -continue dopplers/BPP twice weekly -BMZ 10/22-23  3) T2DM -on metformin  and semglee  22units daily -SSI as needed  4) Maternal care -PNV daily -routine OB care -valtrex twice daily for suppression  DISP: Continue inpatient management as outlined above, ideally delivery at 34wks or if clinically indicated.  Will continue to closely monitor  Winton Offord M Keiarah Orlowski 03/27/2024,12:27 PM

## 2024-03-28 LAB — GLUCOSE, CAPILLARY: Glucose-Capillary: 142 mg/dL — ABNORMAL HIGH (ref 70–99)

## 2024-03-28 MED ORDER — INSULIN ASPART 100 UNIT/ML IJ SOLN: 16.0000 [IU] | Freq: Three times a day (TID) | INTRAMUSCULAR | Status: DC

## 2024-03-28 MED ORDER — INSULIN ASPART 100 UNIT/ML IJ SOLN: 8.0000 [IU] | Freq: Three times a day (TID) | INTRAMUSCULAR | Status: DC

## 2024-03-28 MED ORDER — INSULIN GLARGINE-YFGN 100 UNIT/ML ~~LOC~~ SOLN
20.0000 [IU] | Freq: Every day | SUBCUTANEOUS | Status: DC
  Administered 2024-03-28: 20 [IU] via SUBCUTANEOUS
  Filled 2024-03-28: qty 0.2

## 2024-03-28 MED ORDER — LABETALOL HCL 200 MG PO TABS
800.0000 mg | ORAL_TABLET | Freq: Three times a day (TID) | ORAL | Status: DC
  Administered 2024-03-28 – 2024-04-14 (×52): 800 mg via ORAL
  Filled 2024-03-28 (×21): qty 4
  Filled 2024-03-28: qty 8
  Filled 2024-03-28 (×11): qty 4
  Filled 2024-03-28: qty 8
  Filled 2024-03-28 (×19): qty 4

## 2024-03-28 MED ORDER — INSULIN ASPART 100 UNIT/ML IJ SOLN
8.0000 [IU] | Freq: Three times a day (TID) | INTRAMUSCULAR | Status: DC
  Administered 2024-03-28 – 2024-03-29 (×4): 8 [IU] via SUBCUTANEOUS

## 2024-03-28 MED ORDER — INSULIN GLARGINE-YFGN 100 UNIT/ML ~~LOC~~ SOLN
18.0000 [IU] | Freq: Every day | SUBCUTANEOUS | Status: DC
  Administered 2024-03-29: 18 [IU] via SUBCUTANEOUS
  Filled 2024-03-28 (×2): qty 0.18

## 2024-03-28 NOTE — Progress Notes (Signed)
 FACULTY PRACTICE ANTEPARTUM PROGRESS NOTE  Summer Campbell is a 33 y.o. G2P0010 at [redacted]w[redacted]d who is admitted for chronic hypertension with superimposed severe preeclampsia.  Estimated Date of Delivery: 06/01/24 Fetal presentation is cephalic.  Length of Stay:  4 Days. Admitted 03/24/2024  Subjective: Pt is eating breakfast.  She denies headache, visual changes and RUQ pain. Patient reports normal fetal movement.  She denies uterine contractions, denies bleeding and leaking of fluid per vagina.  Vitals:  Blood pressure (!) 150/67, pulse 79, temperature 97.9 F (36.6 C), temperature source Oral, resp. rate 19, height 5' 4 (1.626 m), weight (!) 152.5 kg, SpO2 99%. Physical Examination: CONSTITUTIONAL: Well-developed, obese, well-nourished female in no acute distress.  HENT:  Normocephalic, atraumatic, External right and left ear normal. Oropharynx is clear and moist EYES: Conjunctivae and EOM are normal.  NECK: Normal range of motion, supple, no masses. SKIN: Skin is warm and dry. No rash noted. Not diaphoretic. No erythema. No pallor. NEUROLGIC: Alert and oriented to person, place, and time. Normal reflexes, muscle tone coordination. No cranial nerve deficit noted. PSYCHIATRIC: Normal mood and affect. Normal behavior. Normal judgment and thought content. CARDIOVASCULAR: Normal heart rate noted, regular rhythm RESPIRATORY: Effort and breath sounds normal, no problems with respiration noted MUSCULOSKELETAL: Normal range of motion. No edema and no tenderness. ABDOMEN: Soft, nontender, nondistended, gravid. CERVIX: deferred  Fetal monitoring: FHR: 125-130 bpm, Variability: moderate, Accelerations: Present, Decelerations: Absent  Uterine activity: none  Results for orders placed or performed during the hospital encounter of 03/24/24 (from the past 48 hours)  Glucose, capillary     Status: Abnormal   Collection Time: 03/26/24  3:40 PM  Result Value Ref Range   Glucose-Capillary 120 (H) 70 - 99  mg/dL    Comment: Glucose reference range applies only to samples taken after fasting for at least 8 hours.  Glucose, capillary     Status: Abnormal   Collection Time: 03/26/24  9:42 PM  Result Value Ref Range   Glucose-Capillary 136 (H) 70 - 99 mg/dL    Comment: Glucose reference range applies only to samples taken after fasting for at least 8 hours.  CBC     Status: Abnormal   Collection Time: 03/27/24  4:31 AM  Result Value Ref Range   WBC 13.9 (H) 4.0 - 10.5 K/uL   RBC 3.79 (L) 3.87 - 5.11 MIL/uL   Hemoglobin 11.2 (L) 12.0 - 15.0 g/dL   HCT 65.8 (L) 63.9 - 53.9 %   MCV 90.0 80.0 - 100.0 fL   MCH 29.6 26.0 - 34.0 pg   MCHC 32.8 30.0 - 36.0 g/dL   RDW 86.7 88.4 - 84.4 %   Platelets 256 150 - 400 K/uL   nRBC 0.0 0.0 - 0.2 %    Comment: Performed at [facility], [address] [place], [place] [address]  Comprehensive metabolic panel     Status: Abnormal   Collection Time: 03/27/24  4:31 AM  Result Value Ref Range   Sodium 135 135 - 145 mmol/L   Potassium 4.4 3.5 - 5.1 mmol/L   Chloride 103 98 - 111 mmol/L   CO2 22 22 - 32 mmol/L   Glucose, Bld 86 70 - 99 mg/dL    Comment: Glucose reference range applies only to samples taken after fasting for at least 8 hours.   BUN 19 6 - 20 mg/dL   Creatinine, Ser 9.43 0.44 - 1.00 mg/dL   Calcium 8.4 (L) 8.9 - 10.3 mg/dL  Total Protein 5.6 (L) 6.5 - 8.1 g/dL   Albumin 2.4 (L) 3.5 - 5.0 g/dL   AST 28 15 - 41 U/L   ALT 39 0 - 44 U/L   Alkaline Phosphatase 50 38 - 126 U/L   Total Bilirubin 0.3 0.0 - 1.2 mg/dL   GFR, Estimated >39 >39 mL/min    Comment: (NOTE) Calculated using the CKD-EPI Creatinine Equation (2021)    Anion gap 10 5 - 15    Comment: Performed at [facility], [address] [place], [place] [address]  Glucose, capillary     Status: None   Collection Time: 03/27/24  7:01 AM  Result Value Ref Range   Glucose-Capillary 74 70 - 99 mg/dL    Comment: Glucose reference range applies only to samples taken after  fasting for at least 8 hours.  Glucose, capillary     Status: None   Collection Time: 03/27/24 11:24 AM  Result Value Ref Range   Glucose-Capillary 74 70 - 99 mg/dL    Comment: Glucose reference range applies only to samples taken after fasting for at least 8 hours.  Glucose, capillary     Status: Abnormal   Collection Time: 03/27/24  3:36 PM  Result Value Ref Range   Glucose-Capillary 124 (H) 70 - 99 mg/dL    Comment: Glucose reference range applies only to samples taken after fasting for at least 8 hours.  Type and screen [facility]     Status: None   Collection Time: 03/27/24  5:29 PM  Result Value Ref Range   ABO/RH(D) A POS    Antibody Screen NEG    Sample Expiration      03/30/2024,2359 Performed at [facility], [address] [place], [place] [address]   Glucose, capillary     Status: Abnormal   Collection Time: 03/27/24  7:19 PM  Result Value Ref Range   Glucose-Capillary 61 (L) 70 - 99 mg/dL    Comment: Glucose reference range applies only to samples taken after fasting for at least 8 hours.  Glucose, capillary     Status: Abnormal   Collection Time: 03/27/24  8:13 PM  Result Value Ref Range   Glucose-Capillary 62 (L) 70 - 99 mg/dL    Comment: Glucose reference range applies only to samples taken after fasting for at least 8 hours.  Glucose, capillary     Status: Abnormal   Collection Time: 03/27/24  8:39 PM  Result Value Ref Range   Glucose-Capillary 60 (L) 70 - 99 mg/dL    Comment: Glucose reference range applies only to samples taken after fasting for at least 8 hours.  Glucose, capillary     Status: Abnormal   Collection Time: 03/27/24  9:18 PM  Result Value Ref Range   Glucose-Capillary 104 (H) 70 - 99 mg/dL    Comment: Glucose reference range applies only to samples taken after fasting for at least 8 hours.    I have reviewed the patient's current medications.  ASSESSMENT: Principal Problem:   Chronic hypertension with  superimposed preeclampsia Active Problems:   Severe pre-eclampsia   IUGR (intrauterine growth restriction) affecting care of mother   PLAN: Superimposed preeclampsia    - increased labetalol  to 800 mg po TID, continue procardia  60 mg BID    - pt asymptomatic    - labs q 72 hours  2. FWB, FGR     - reactive NST for gestational age     - BPP  dopplers Monday and Thursday, orders placed for tomorrow     - known IAEDF, pt is s/p BMZ  3. T2DM     - multiple episodes of hypoglycemia     - decreased semglee  to 20 units daily, decreased humalog to 16 units TID with meals, continue sliding scale  4.  Maternal care      -daily PNV      - routine OB care      -suppressive valtrex   Continue inpatient care, may need to reconsult MFM regarding delivery if hypertension persists with 2 meds maxed out or testing worsens   Continue routine antenatal care.   Jerilynn Buddle, MD [facility] Faculty Attending, Center for [facility] 03/28/2024 9:23 AM

## 2024-03-28 NOTE — Plan of Care (Signed)
  Problem: Education: Goal: Knowledge of disease or condition will improve Outcome: Progressing Goal: Knowledge of the prescribed therapeutic regimen will improve Outcome: Progressing   Problem: Fluid Volume: Goal: Peripheral tissue perfusion will improve Outcome: Progressing   Problem: Clinical Measurements: Goal: Complications related to disease process, condition or treatment will be avoided or minimized Outcome: Progressing   Problem: Health Behavior/Discharge Planning: Goal: Ability to manage health-related needs will improve Outcome: Progressing   Problem: Clinical Measurements: Goal: Ability to maintain clinical measurements within normal limits will improve Outcome: Progressing Goal: Will remain free from infection Outcome: Progressing Goal: Diagnostic test results will improve Outcome: Progressing Goal: Respiratory complications will improve Outcome: Progressing Goal: Cardiovascular complication will be avoided Outcome: Progressing   Problem: Activity: Goal: Risk for activity intolerance will decrease Outcome: Progressing   Problem: Nutrition: Goal: Adequate nutrition will be maintained Outcome: Progressing   Problem: Coping: Goal: Level of anxiety will decrease Outcome: Progressing   Problem: Elimination: Goal: Will not experience complications related to bowel motility Outcome: Progressing Goal: Will not experience complications related to urinary retention Outcome: Progressing   Problem: Pain Managment: Goal: General experience of comfort will improve and/or be controlled Outcome: Progressing   Problem: Safety: Goal: Ability to remain free from injury will improve Outcome: Progressing   Problem: Skin Integrity: Goal: Risk for impaired skin integrity will decrease Outcome: Progressing   Problem: Education: Goal: Knowledge of disease or condition will improve Outcome: Progressing Goal: Knowledge of the prescribed therapeutic regimen will  improve Outcome: Progressing Goal: Individualized Educational Video(s) Outcome: Progressing   Problem: Clinical Measurements: Goal: Complications related to the disease process, condition or treatment will be avoided or minimized Outcome: Progressing   Problem: Education: Goal: Ability to describe self-care measures that may prevent or decrease complications (Diabetes Survival Skills Education) will improve Outcome: Progressing Goal: Individualized Educational Video(s) Outcome: Progressing   Problem: Coping: Goal: Ability to adjust to condition or change in health will improve Outcome: Progressing   Problem: Fluid Volume: Goal: Ability to maintain a balanced intake and output will improve Outcome: Progressing   Problem: Health Behavior/Discharge Planning: Goal: Ability to identify and utilize available resources and services will improve Outcome: Progressing Goal: Ability to manage health-related needs will improve Outcome: Progressing   Problem: Metabolic: Goal: Ability to maintain appropriate glucose levels will improve Outcome: Progressing   Problem: Nutritional: Goal: Maintenance of adequate nutrition will improve Outcome: Progressing Goal: Progress toward achieving an optimal weight will improve Outcome: Progressing   Problem: Skin Integrity: Goal: Risk for impaired skin integrity will decrease Outcome: Progressing   Problem: Tissue Perfusion: Goal: Adequacy of tissue perfusion will improve Outcome: Progressing

## 2024-03-28 NOTE — Progress Notes (Signed)
 2 hour postprandial CBG 62. Hypoglycemic protocol initiated x3 with end result of 83.

## 2024-03-28 NOTE — Inpatient Diabetes Management (Signed)
 Inpatient Diabetes Program Recommendations  Diabetes Treatment Program Recommendations  ADA Standards of Care Diabetes in Pregnancy Target Glucose Ranges:  Fasting: 70 - 95 mg/dL 1 hr postprandial: Less than 140mg /dL (from first bite of meal) 2 hr postprandial: Less than 120 mg/dL (from first bite of meal)     Latest Reference Range & Units 03/27/24 07:01 03/27/24 11:24 03/27/24 15:36 03/27/24 19:19 03/27/24 20:13 03/27/24 20:39 03/27/24 21:18  Glucose-Capillary 70 - 99 mg/dL 74 74 875 (H) 61 (L) 62 (L) 60 (L) 104 (H)   Review of Glycemic Control  Diabetes history: DM2; [redacted]w[redacted]d Outpatient Diabetes medications: Tresiba 25 units at bedtime, Novolog  22 units TID with meals, Metformin  1000 mg BID, Dexcom G7 DM meds Prior to pregnancy: Mounjaro weekly, Farxiga every day, Metformin  1000 mg BID.  Current orders for Inpatient glycemic control: Semglee  22 units daily, Novolog  20 units TID with meals, Novolog  0-15 units TID, Metformin  1000 mg BID   Inpatient Diabetes Program Recommendations:    Insulin : Patient received betamethasone on 10/22 and 03/25/24 which was contributing to hyperglycemia. CBG down to 60 mg/dl at 79:60 on 89/74/74. Please consider decreasing Semglee  to 20 units daily and meal coverage to 17 units TID with meals.  NOTE: Noted consult for Diabetes Coordinator. Diabetes Coordinator is not on campus over the weekend but available by pager from 8am to 5pm for questions or concerns.   Thanks, Earnie Gainer, RN, MSN, CDCES Diabetes Coordinator Inpatient Diabetes Program [phone] (Team Pager from 8am to 5pm)

## 2024-03-29 ENCOUNTER — Inpatient Hospital Stay (HOSPITAL_COMMUNITY)

## 2024-03-29 DIAGNOSIS — O365930 Maternal care for other known or suspected poor fetal growth, third trimester, not applicable or unspecified: Secondary | ICD-10-CM

## 2024-03-29 DIAGNOSIS — O99343 Other mental disorders complicating pregnancy, third trimester: Secondary | ICD-10-CM

## 2024-03-29 DIAGNOSIS — O1413 Severe pre-eclampsia, third trimester: Secondary | ICD-10-CM

## 2024-03-29 DIAGNOSIS — O24113 Pre-existing diabetes mellitus, type 2, in pregnancy, third trimester: Secondary | ICD-10-CM

## 2024-03-29 DIAGNOSIS — E119 Type 2 diabetes mellitus without complications: Secondary | ICD-10-CM

## 2024-03-29 DIAGNOSIS — Z3A30 30 weeks gestation of pregnancy: Secondary | ICD-10-CM

## 2024-03-29 DIAGNOSIS — F419 Anxiety disorder, unspecified: Secondary | ICD-10-CM

## 2024-03-29 LAB — GLUCOSE, CAPILLARY
Glucose-Capillary: 73 mg/dL (ref 70–99)
Glucose-Capillary: 86 mg/dL (ref 70–99)
Glucose-Capillary: 96 mg/dL (ref 70–99)

## 2024-03-29 NOTE — Inpatient Diabetes Management (Signed)
 Inpatient Diabetes Program Recommendations  Diabetes Treatment Program Recommendations   ADA Standards of Care Diabetes in Pregnancy Target Glucose Ranges:   Fasting: 70 - 95 mg/dL 1 hr postprandial: Less than 140mg /dL (from first bite of meal) 2 hr postprandial: Less than 120 mg/dL (from first bite of meal)    Lab Results  Component Value Date   GLUCAP 73 03/29/2024   HGBA1C 6.8 (H) 01/01/2024    Review of Glycemic Control  Latest Reference Range & Units 03/27/24 20:13 03/27/24 20:39 03/27/24 21:18 03/28/24 16:03 03/29/24 08:09  Glucose-Capillary 70 - 99 mg/dL 62 (L) 60 (L) 895 (H) 857 (H) 73   Diabetes history:  DM2; [redacted]w[redacted]d  Outpatient Diabetes medications:  Tresiba 25 units at bedtime, Novolog  22 units TID with meals, Metformin  1000 mg BID, Dexcom G7  Current orders for Inpatient glycemic control:  Novolog  8 units tid with meals Novolog  0-15 units tid post prandial Semglee  18 units daily  Inpatient Diabetes Program Recommendations:    Note reductions in insulins due to  hypoglycemia.  Will follow.   Thanks,  Randall Bullocks, RN, BC-ADM Inpatient Diabetes Coordinator Pager [phone]  (8a-5p)

## 2024-03-29 NOTE — Progress Notes (Addendum)
 Daily Antepartum Note  Admission Date: 03/24/2024 Current Date: 03/29/2024 10:14 AM  Summer Campbell is a 33 y.o. G2P0010 at 102w6d, admitted for severe pre-eclampsia (BPs) superimposed on CHTN and with new FGR  Pregnancy complicated by: Patient Active Problem List   Diagnosis Date Noted   IUGR (intrauterine growth restriction) affecting care of mother 03/25/2024   Chronic hypertension with superimposed preeclampsia 03/24/2024   Severe pre-eclampsia 03/24/2024   Preexisting diabetes complicating pregnancy, antepartum 01/01/2024   Preexisting hypertension complicating pregnancy, antepartum 01/01/2024   Supervision of high risk pregnancy, antepartum 12/18/2023   Maternal morbid obesity, antepartum (HCC) 12/18/2023   LGSIL on Pap smear of cervix 06/21/2022   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   Objective:    Current Vital Signs 24h Vital Sign Ranges  T 98 F (36.7 C) Temp  Avg: 98.1 F (36.7 C)  Min: 97.8 F (36.6 C)  Max: 98.6 F (37 C)  BP (!) 149/77 BP  Min: 122/54  Max: 149/77  HR 79 Pulse  Avg: 84.3  Min: 79  Max: 88  RR 20 Resp  Avg: 18.3  Min: 17  Max: 20  SaO2 100 % Room Air SpO2  Avg: 99 %  Min: 98 %  Max: 100 %       24 Hour I/O Current Shift I/O  Time Ins Outs 10/26 0701 - 10/27 0700 In: 120 [P.O.:120] Out: 200 [Urine:200] No intake/output data recorded.   Patient Vitals for the past 24 hrs:  BP Temp Temp src Pulse Resp SpO2  03/29/24 0816 (!) 149/77 98 F (36.7 C) Oral 79 20 100 %  03/29/24 0411 (!) 147/72 98 F (36.7 C) Oral 82 18 --  03/29/24 0003 (!) 149/78 98.6 F (37 C) Oral 85 18 --   03/28/24 1930 (!) 134/56 98 F (36.7 C) -- 85 18 --  03/28/24 1617 139/65 97.8 F (36.6 C) Oral 88 17 98 %  03/28/24 1203 (!) 122/54 98.4 F (36.9 C) Oral 87 19 99 %    Fetal Heart Tones: 135 baseline, +accels, no decel, mod variability Tocometry: quiet  Physical exam: General: Well nourished, well developed female in no acute distress. Abdomen: gravid nttp Cardiovascular: S1, S2 normal, no murmur, rub or gallop, regular rate and rhythm Respiratory: CTAB Skin: Warm and dry.   Medications: Current Facility-Administered Medications  Medication Dose Route Frequency Provider Last Rate Last Admin   acetaminophen  (TYLENOL ) tablet 650 mg  650 mg Oral Q6H PRN Izell Harari, MD   650 mg at 03/29/24 0200   aspirin  chewable tablet 162 mg  162 mg Oral Daily Izell Harari, MD   162 mg at 03/29/24 1004   bisacodyl (DULCOLAX) EC tablet 5 mg  5 mg Oral Once Izell Harari, MD       calcium carbonate (TUMS - dosed in mg elemental calcium) chewable tablet 400 mg of elemental calcium  2 tablet Oral Q4H PRN Izell Harari, MD   400 mg of elemental calcium at 03/26/24 1234   famotidine (PEPCID) tablet 20 mg  20 mg Oral BID Izell Harari, MD   20 mg at 03/29/24 1004   labetalol  (NORMODYNE ) injection 20 mg  20  mg Intravenous PRN Littie Olam LABOR, NP   20 mg at 03/24/24 1729   And   labetalol  (NORMODYNE ) injection 40 mg  40 mg Intravenous PRN Cooleen, Lisa A, NP       And   labetalol  (NORMODYNE ) injection 80 mg  80 mg Intravenous PRN Cooleen, Lisa A, NP       And   hydrALAZINE (APRESOLINE) injection 10 mg  10 mg Intravenous PRN Cooleen, Lisa A, NP       insulin  aspart (novoLOG ) injection 0-15 Units  0-15 Units Subcutaneous TID BUREN Izell Harari, MD   3 Units at 03/28/24 1610   insulin  aspart (novoLOG ) injection 8 Units  8 Units Subcutaneous TID WC Izell Harari, MD   8 Units at 03/29/24 1005   insulin  glargine-yfgn (SEMGLEE ) injection 18 Units  18 Units Subcutaneous Daily Ozan, Jennifer,  DO   18 Units at 03/29/24 1004   labetalol  (NORMODYNE ) tablet 800 mg  800 mg Oral TID Zina Jerilynn LABOR, MD   800 mg at 03/29/24 1004   metFORMIN  (GLUCOPHAGE ) tablet 1,000 mg  1,000 mg Oral BID WC Izell Harari, MD   1,000 mg at 03/29/24 1004   metoCLOPramide (REGLAN) injection 10 mg  10 mg Intravenous Q8H PRN Izell Harari, MD   10 mg at 03/25/24 9491   NIFEdipine  (PROCARDIA  XL/NIFEDICAL XL) 24 hr tablet 60 mg  60 mg Oral BID Izell Harari, MD   60 mg at 03/29/24 1004   polyethylene glycol (MIRALAX / GLYCOLAX) packet 17 g  17 g Oral QODAY Vicki Pasqual, MD   17 g at 03/29/24 1004   prenatal multivitamin tablet 1 tablet  1 tablet Oral Q1200 Izell Harari, MD   1 tablet at 03/28/24 1051   valACYclovir (VALTREX) tablet 500 mg  500 mg Oral BID Izell Harari, MD   500 mg at 03/29/24 1004   Labs:   Latest Reference Range & Units 03/27/24 07:01 03/27/24 11:24 03/27/24 15:36 03/27/24 19:19 03/27/24 20:13 03/27/24 20:39 03/27/24 21:18 03/28/24 16:03 03/29/24 08:09  Glucose-Capillary 70 - 99 mg/dL 74 74 875 (H) 61 (L) 62 (L) 60 (L) 104 (H) 142 (H) 73   Recent Labs  Lab 03/24/24 1535 03/27/24 0431  WBC 12.1* 13.9*  HGB 11.7* 11.2*  HCT 35.6* 34.1*  PLT 268 256   Recent Labs  Lab 03/23/24 1533 03/24/24 1535 03/27/24 0431  NA 138 137 135  K 4.8 4.3 4.4  CL 102 104 103  CO2 22 22 22   BUN 17 15 19   CREATININE 0.62 0.63 0.56  CALCIUM 9.9 8.8* 8.4*  PROT 6.1 5.8* 5.6*  BILITOT <0.2 0.2 0.3  ALKPHOS 75 52 50  ALT 19 21 39  AST 16 20 28   GLUCOSE 100* 121* 86   Radiology:  Pending for today 10/23: efw 8%, 1323g, ac 13%, IAEDF, bpp 8/8, AFV wnl Assessment & Plan:  Patient stable *Pregnancy: reactive NST overnight. bid NSTs *Severe pre-eclampsia: follow up surveillance labs during the week, continue current BP regimen. I told her that she's on max dose of two BP meds and if she needs an additional agent, I would recommend proceeding with delivery *FGR: follow up repeat  dopplers today *Preterm: s/p BMZ on 10/22 and 10/23, NICU consult *DM2: continue current regimen. DM team following *PPx: SCDs, OOB ad lib *FEN/GI: GDM diet, saline lock IV *Dispo: inpatient until delvery.   Harari Izell Raddle MD Attending Center for [facility] Midwife) GYN Consult Phone: [phone] (M-F, 0800-1700) & [phone]  (Off  hours, weekends, holidays)

## 2024-03-29 NOTE — Plan of Care (Signed)
  Problem: Education: Goal: Knowledge of disease or condition will improve Outcome: Progressing Goal: Knowledge of the prescribed therapeutic regimen will improve Outcome: Progressing   Problem: Fluid Volume: Goal: Peripheral tissue perfusion will improve Outcome: Progressing   Problem: Clinical Measurements: Goal: Complications related to disease process, condition or treatment will be avoided or minimized Outcome: Progressing   Problem: Health Behavior/Discharge Planning: Goal: Ability to manage health-related needs will improve Outcome: Progressing   Problem: Clinical Measurements: Goal: Ability to maintain clinical measurements within normal limits will improve Outcome: Progressing Goal: Will remain free from infection Outcome: Progressing Goal: Diagnostic test results will improve Outcome: Progressing Goal: Respiratory complications will improve Outcome: Progressing Goal: Cardiovascular complication will be avoided Outcome: Progressing   Problem: Activity: Goal: Risk for activity intolerance will decrease Outcome: Progressing   Problem: Nutrition: Goal: Adequate nutrition will be maintained Outcome: Progressing   Problem: Coping: Goal: Level of anxiety will decrease Outcome: Progressing   Problem: Elimination: Goal: Will not experience complications related to bowel motility Outcome: Progressing Goal: Will not experience complications related to urinary retention Outcome: Progressing   Problem: Pain Managment: Goal: General experience of comfort will improve and/or be controlled Outcome: Progressing   Problem: Safety: Goal: Ability to remain free from injury will improve Outcome: Progressing   Problem: Skin Integrity: Goal: Risk for impaired skin integrity will decrease Outcome: Progressing   Problem: Education: Goal: Knowledge of disease or condition will improve Outcome: Progressing Goal: Knowledge of the prescribed therapeutic regimen will  improve Outcome: Progressing Goal: Individualized Educational Video(s) Outcome: Progressing   Problem: Clinical Measurements: Goal: Complications related to the disease process, condition or treatment will be avoided or minimized Outcome: Progressing   Problem: Education: Goal: Ability to describe self-care measures that may prevent or decrease complications (Diabetes Survival Skills Education) will improve Outcome: Progressing Goal: Individualized Educational Video(s) Outcome: Progressing   Problem: Coping: Goal: Ability to adjust to condition or change in health will improve Outcome: Progressing   Problem: Fluid Volume: Goal: Ability to maintain a balanced intake and output will improve Outcome: Progressing   Problem: Health Behavior/Discharge Planning: Goal: Ability to identify and utilize available resources and services will improve Outcome: Progressing Goal: Ability to manage health-related needs will improve Outcome: Progressing   Problem: Metabolic: Goal: Ability to maintain appropriate glucose levels will improve Outcome: Progressing   Problem: Nutritional: Goal: Maintenance of adequate nutrition will improve Outcome: Progressing Goal: Progress toward achieving an optimal weight will improve Outcome: Progressing   Problem: Skin Integrity: Goal: Risk for impaired skin integrity will decrease Outcome: Progressing   Problem: Tissue Perfusion: Goal: Adequacy of tissue perfusion will improve Outcome: Progressing

## 2024-03-29 NOTE — Plan of Care (Signed)
  Problem: Education: Goal: Knowledge of disease or condition will improve Outcome: Progressing   Problem: Education: Goal: Knowledge of the prescribed therapeutic regimen will improve Outcome: Progressing   Problem: Fluid Volume: Goal: Peripheral tissue perfusion will improve Outcome: Progressing   Problem: Clinical Measurements: Goal: Complications related to disease process, condition or treatment will be avoided or minimized Outcome: Progressing   Problem: Health Behavior/Discharge Planning: Goal: Ability to manage health-related needs will improve Outcome: Progressing

## 2024-03-30 LAB — COMPREHENSIVE METABOLIC PANEL WITH GFR
ALT: 30 U/L (ref 0–44)
AST: 18 U/L (ref 15–41)
Albumin: 2.1 g/dL — ABNORMAL LOW (ref 3.5–5.0)
Alkaline Phosphatase: 51 U/L (ref 38–126)
Anion gap: 10 (ref 5–15)
BUN: 17 mg/dL (ref 6–20)
CO2: 23 mmol/L (ref 22–32)
Calcium: 8.8 mg/dL — ABNORMAL LOW (ref 8.9–10.3)
Chloride: 104 mmol/L (ref 98–111)
Creatinine, Ser: 0.57 mg/dL (ref 0.44–1.00)
GFR, Estimated: >60 mL/min (ref >60)
Glucose, Bld: 92 mg/dL (ref 70–99)
Potassium: 4.2 mmol/L (ref 3.5–5.1)
Sodium: 137 mmol/L (ref 135–145)
Total Bilirubin: 0.3 mg/dL (ref 0.0–1.2)
Total Protein: 5.3 g/dL — ABNORMAL LOW (ref 6.5–8.1)

## 2024-03-30 LAB — CBC
HCT: 34.9 % — ABNORMAL LOW (ref 36.0–46.0)
Hemoglobin: 11.3 g/dL — ABNORMAL LOW (ref 12.0–15.0)
MCH: 28.7 pg (ref 26.0–34.0)
MCHC: 32.4 g/dL (ref 30.0–36.0)
MCV: 88.6 fL (ref 80.0–100.0)
Platelets: 248 K/uL (ref 150–400)
RBC: 3.94 MIL/uL (ref 3.87–5.11)
RDW: 13 % (ref 11.5–15.5)
WBC: 14.7 K/uL — ABNORMAL HIGH (ref 4.0–10.5)
nRBC: 0 % (ref 0.0–0.2)

## 2024-03-30 LAB — GLUCOSE, CAPILLARY
Glucose-Capillary: 63 mg/dL — ABNORMAL LOW (ref 70–99)
Glucose-Capillary: 82 mg/dL (ref 70–99)
Glucose-Capillary: 99 mg/dL (ref 70–99)

## 2024-03-30 MED ORDER — INSULIN GLARGINE-YFGN 100 UNIT/ML ~~LOC~~ SOLN
14.0000 [IU] | Freq: Every day | SUBCUTANEOUS | Status: DC
  Administered 2024-03-30: 14 [IU] via SUBCUTANEOUS
  Filled 2024-03-30 (×2): qty 0.14

## 2024-03-30 MED ORDER — INSULIN ASPART 100 UNIT/ML IJ SOLN
6.0000 [IU] | Freq: Three times a day (TID) | INTRAMUSCULAR | Status: DC
  Administered 2024-03-30 – 2024-04-03 (×13): 6 [IU] via SUBCUTANEOUS
  Filled 2024-03-30: qty 6

## 2024-03-30 NOTE — Plan of Care (Signed)
  Problem: Education: Goal: Knowledge of disease or condition will improve Outcome: Progressing Goal: Knowledge of the prescribed therapeutic regimen will improve Outcome: Progressing   Problem: Fluid Volume: Goal: Peripheral tissue perfusion will improve Outcome: Progressing   Problem: Clinical Measurements: Goal: Complications related to disease process, condition or treatment will be avoided or minimized Outcome: Progressing   Problem: Health Behavior/Discharge Planning: Goal: Ability to manage health-related needs will improve Outcome: Progressing   Problem: Clinical Measurements: Goal: Ability to maintain clinical measurements within normal limits will improve Outcome: Progressing Goal: Will remain free from infection Outcome: Progressing Goal: Diagnostic test results will improve Outcome: Progressing Goal: Respiratory complications will improve Outcome: Progressing Goal: Cardiovascular complication will be avoided Outcome: Progressing   Problem: Activity: Goal: Risk for activity intolerance will decrease Outcome: Progressing   Problem: Nutrition: Goal: Adequate nutrition will be maintained Outcome: Progressing   Problem: Coping: Goal: Level of anxiety will decrease Outcome: Progressing   Problem: Elimination: Goal: Will not experience complications related to bowel motility Outcome: Progressing Goal: Will not experience complications related to urinary retention Outcome: Progressing   Problem: Pain Managment: Goal: General experience of comfort will improve and/or be controlled Outcome: Progressing   Problem: Safety: Goal: Ability to remain free from injury will improve Outcome: Progressing   Problem: Skin Integrity: Goal: Risk for impaired skin integrity will decrease Outcome: Progressing   Problem: Education: Goal: Knowledge of disease or condition will improve Outcome: Progressing Goal: Knowledge of the prescribed therapeutic regimen will  improve Outcome: Progressing Goal: Individualized Educational Video(s) Outcome: Progressing   Problem: Clinical Measurements: Goal: Complications related to the disease process, condition or treatment will be avoided or minimized Outcome: Progressing   Problem: Education: Goal: Ability to describe self-care measures that may prevent or decrease complications (Diabetes Survival Skills Education) will improve Outcome: Progressing Goal: Individualized Educational Video(s) Outcome: Progressing   Problem: Coping: Goal: Ability to adjust to condition or change in health will improve Outcome: Progressing   Problem: Fluid Volume: Goal: Ability to maintain a balanced intake and output will improve Outcome: Progressing   Problem: Health Behavior/Discharge Planning: Goal: Ability to identify and utilize available resources and services will improve Outcome: Progressing Goal: Ability to manage health-related needs will improve Outcome: Progressing   Problem: Metabolic: Goal: Ability to maintain appropriate glucose levels will improve Outcome: Progressing   Problem: Nutritional: Goal: Maintenance of adequate nutrition will improve Outcome: Progressing Goal: Progress toward achieving an optimal weight will improve Outcome: Progressing   Problem: Skin Integrity: Goal: Risk for impaired skin integrity will decrease Outcome: Progressing   Problem: Tissue Perfusion: Goal: Adequacy of tissue perfusion will improve Outcome: Progressing

## 2024-03-30 NOTE — Progress Notes (Signed)
 Hypoglycemic Event  CBG: 63  Treatment: 4 oz juice/soda  Symptoms: None  Follow-up CBG: Time:0812 CBG Result:82  Possible Reasons for Event: Inadequate meal intake  Comments/MD notified:n/a; protocol followed    Summer Campbell Joy A Stanlee Roehrig

## 2024-03-30 NOTE — Progress Notes (Signed)
 Daily Antepartum Note  Admission Date: 03/24/2024 Current Date: 03/30/2024 9:07 AM  Summer Campbell is a 33 y.o. G2P0010 at [redacted]w[redacted]d, admitted for severe pre-eclampsia (BPs) superimposed on CHTN and with new FGR  Pregnancy complicated by: Patient Active Problem List   Diagnosis Date Noted   IUGR (intrauterine growth restriction) affecting care of mother 03/25/2024   Chronic hypertension with superimposed preeclampsia 03/24/2024   Severe pre-eclampsia 03/24/2024   Preexisting diabetes complicating pregnancy, antepartum 01/01/2024   Preexisting hypertension complicating pregnancy, antepartum 01/01/2024   Supervision of high risk pregnancy, antepartum 12/18/2023   Maternal morbid obesity, antepartum (HCC) 12/18/2023   LGSIL on Pap smear of cervix 06/21/2022   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   Objective:    Current Vital Signs 24h Vital Sign Ranges  T 97.7 F (36.5 C) Temp  Avg: 98 F (36.7 C)  Min: 97.7 F (36.5 C)  Max: 98.3 F (36.8 C)  BP (!) 152/72 BP  Min: 124/52  Max: 158/77  HR 75 Pulse  Avg: 81.9  Min: 75  Max: 90  RR 16 Resp  Avg: 17.2  Min: 16  Max: 18  SaO2 98 % Room Air SpO2  Avg: 98 %  Min: 97 %  Max: 99 %       24 Hour I/O Current Shift I/O  Time Ins Outs No intake/output data recorded. No intake/output data recorded.   Patient Vitals for the past 24 hrs:  BP Temp Temp src Pulse Resp SpO2  03/30/24 0757 (!) 152/72 97.7 F (36.5 C) Oral 75 16 98 %  03/30/24 0400 (!) 155/76 98.3 F (36.8 C) Oral 75 16 99 %  03/29/24 2329 132/68 98.1 F (36.7 C) -- 83 18 97 %  03/29/24 2139 (!) 158/77 -- -- 85  -- --  03/29/24 2000 (!) 143/65 97.8 F (36.6 C) Oral 84 18 98 %  03/29/24 1637 (!) 148/66 -- -- 90 -- --  03/29/24 1544 (!) 124/52 97.9 F (36.6 C) Oral 87 17 97 %  03/29/24 1205 (!) 148/68 98.2 F (36.8 C) Oral 76 18 99 %    Fetal Heart Tones: 135 baseline, +accels, no decel, mod variability Tocometry: quiet  Physical exam: General: Well nourished, well developed female in no acute distress. Abdomen: gravid nttp Cardiovascular: S1, S2 normal, no murmur, rub or gallop, regular rate and rhythm Respiratory: CTAB Skin: Warm and dry.   Medications: Current Facility-Administered Medications  Medication Dose Route Frequency Provider Last Rate Last Admin   acetaminophen  (TYLENOL ) tablet 650 mg  650 mg Oral Q6H PRN Izell Harari, MD   650 mg at 03/29/24 0200   aspirin  chewable tablet 162 mg  162 mg Oral Daily Izell Harari, MD   162 mg at 03/29/24 1004   bisacodyl (DULCOLAX) EC tablet 5 mg  5 mg Oral Once Izell Harari, MD       calcium carbonate (TUMS - dosed in mg elemental calcium) chewable tablet 400 mg of elemental calcium  2 tablet Oral Q4H PRN Izell Harari, MD   400 mg of elemental calcium at 03/26/24 1234   famotidine (PEPCID) tablet 20 mg  20 mg Oral BID Izell Harari, MD  20 mg at 03/29/24 2140   labetalol  (NORMODYNE ) injection 20 mg  20 mg Intravenous PRN Littie Olam LABOR, NP   20 mg at 03/24/24 1729   And   labetalol  (NORMODYNE ) injection 40 mg  40 mg Intravenous PRN Cooleen, Olam LABOR, NP       And   labetalol  (NORMODYNE ) injection 80 mg  80 mg Intravenous PRN Cooleen, Lisa A, NP       And   hydrALAZINE (APRESOLINE) injection 10 mg  10 mg Intravenous PRN Cooleen, Lisa A, NP       insulin  aspart (novoLOG ) injection 0-15 Units  0-15 Units Subcutaneous TID BUREN Izell Harari, MD   3 Units at 03/28/24 1610   insulin  aspart (novoLOG ) injection 8 Units  8 Units Subcutaneous TID WC Izell Harari, MD   8 Units at 03/29/24 1944   insulin  glargine-yfgn (SEMGLEE )  injection 18 Units  18 Units Subcutaneous Daily Ozan, Jennifer, DO   18 Units at 03/29/24 1004   labetalol  (NORMODYNE ) tablet 800 mg  800 mg Oral TID Zina Jerilynn LABOR, MD   800 mg at 03/29/24 2139   metFORMIN  (GLUCOPHAGE ) tablet 1,000 mg  1,000 mg Oral BID WC Izell Harari, MD   1,000 mg at 03/30/24 0847   metoCLOPramide (REGLAN) injection 10 mg  10 mg Intravenous Q8H PRN Izell Harari, MD   10 mg at 03/25/24 9491   NIFEdipine  (PROCARDIA  XL/NIFEDICAL XL) 24 hr tablet 60 mg  60 mg Oral BID Izell Harari, MD   60 mg at 03/29/24 2140   polyethylene glycol (MIRALAX / GLYCOLAX) packet 17 g  17 g Oral QODAY Jewell Ryans, MD   17 g at 03/29/24 1004   prenatal multivitamin tablet 1 tablet  1 tablet Oral Q1200 Izell Harari, MD   1 tablet at 03/29/24 1203   valACYclovir (VALTREX) tablet 500 mg  500 mg Oral BID Izell Harari, MD   500 mg at 03/29/24 2140   Labs:   Latest Reference Range & Units 03/29/24 08:09 03/29/24 21:36 03/29/24 22:10 03/30/24 07:53 03/30/24 08:12  Glucose-Capillary 70 - 99 mg/dL 73 96 86 63 (L) 82   Recent Labs  Lab 03/24/24 1535 03/27/24 0431 03/30/24 0413  WBC 12.1* 13.9* 14.7*  HGB 11.7* 11.2* 11.3*  HCT 35.6* 34.1* 34.9*  PLT 268 256 248   Recent Labs  Lab 03/24/24 1535 03/27/24 0431 03/30/24 0413  NA 137 135 137  K 4.3 4.4 4.2  CL 104 103 104  CO2 22 22 23   BUN 15 19 17   CREATININE 0.63 0.56 0.57  CALCIUM 8.8* 8.4* 8.8*  PROT 5.8* 5.6* 5.3*  BILITOT 0.2 0.3 0.3  ALKPHOS 52 50 51  ALT 21 39 30  AST 20 28 18   GLUCOSE 121* 86 92   Radiology:  10/27: IAEDF, AFI 19, bpp 8/8 10/23: efw 8%, 1323g, ac 13%, IAEDF, bpp 8/8, AFV wnl  Assessment & Plan:  Patient stable *Pregnancy: reactive NST yesterday. bid NSTs *Severe pre-eclampsia: continue to closely watch BPs. She's already on max dose of two meds, and I told her that if she is needing another one, I'll talk to MFM but she may need delivery at that point, instead of starting a 3rd agent;  continue q3d surveillance labs  *FGR: stable dopplers on 10/27 and continue repeat 2x/week (next due 10/30) *Preterm: s/p BMZ on 10/22 and 10/23, NICU consult *DM2: I took her down on her semglee  from 18 to 14 and her aspart from 8 to 6 tidac, as  BMZ effect is likely wearing off. DM team following *PPx: SCDs, OOB ad lib *FEN/GI: GDM diet, saline lock IV *Dispo: inpatient until delvery.   Bebe Izell Raddle MD Attending Center for [facility] Healthcare (Faculty Practice) GYN Consult Phone: [phone] (M-F, 0800-1700) & [phone]  (Off hours, weekends, holidays)

## 2024-03-31 ENCOUNTER — Other Ambulatory Visit: Payer: Self-pay | Admitting: Medical Genetics

## 2024-03-31 DIAGNOSIS — Z3A31 31 weeks gestation of pregnancy: Secondary | ICD-10-CM | POA: Diagnosis not present

## 2024-03-31 DIAGNOSIS — O113 Pre-existing hypertension with pre-eclampsia, third trimester: Secondary | ICD-10-CM | POA: Diagnosis not present

## 2024-03-31 DIAGNOSIS — Z006 Encounter for examination for normal comparison and control in clinical research program: Secondary | ICD-10-CM

## 2024-03-31 LAB — GLUCOSE, CAPILLARY
Glucose-Capillary: 101 mg/dL — ABNORMAL HIGH (ref 70–99)
Glucose-Capillary: 63 mg/dL — ABNORMAL LOW (ref 70–99)

## 2024-03-31 LAB — TYPE AND SCREEN
ABO/RH(D): A POS
Antibody Screen: NEGATIVE

## 2024-03-31 MED ORDER — INSULIN GLARGINE-YFGN 100 UNIT/ML ~~LOC~~ SOLN
12.0000 [IU] | Freq: Every day | SUBCUTANEOUS | Status: DC
  Administered 2024-03-31 – 2024-04-01 (×2): 12 [IU] via SUBCUTANEOUS
  Filled 2024-03-31 (×3): qty 0.12

## 2024-03-31 MED ORDER — SODIUM CHLORIDE 0.9% FLUSH
3.0000 mL | Freq: Two times a day (BID) | INTRAVENOUS | Status: DC
  Administered 2024-03-31 – 2024-04-14 (×28): 3 mL via INTRAVENOUS

## 2024-03-31 NOTE — Progress Notes (Signed)
 Patient ID: Summer Campbell, female   DOB: 1990-11-23, 33 y.o.   MRN: [id number] FACULTY PRACTICE ANTEPARTUM(COMPREHENSIVE) NOTE  Summer Campbell is a 33 y.o. G2P0010 at [redacted]w[redacted]d  who is admitted for chronic HTN with superimposed preE.    Fetal presentation is cephalic. Length of Stay:  7  Days  Date of admission:03/24/2024  Subjective: Resting comfortably in bed without any complaints. She denies headache, blurry vision and RUQ pain. Patient reports good fetal movement. She denies contractions, vaginal bleeding and leakage of fluid.  Vitals:  Blood pressure (!) 121/49, pulse 80, temperature 97.9 F (36.6 C), temperature source Oral, resp. rate 18, height 5' 4 (1.626 m), weight (!) 152.5 kg, SpO2 99%. Vitals:   03/30/24 1627 03/30/24 2001 03/30/24 2301 03/31/24 0439  BP: (!) 146/72 (!) 152/68 (!) 142/66 (!) 121/49  Pulse: 89 78 78 80  Resp: 16 18 18 18   Temp: 98 F (36.7 C) 97.9 F (36.6 C) 97.9 F (36.6 C) 97.9 F (36.6 C)  TempSrc: Oral Oral Oral Oral  SpO2: 98% 97% 97% 99%  Weight:      Height:       Physical Examination: GENERAL: Well-developed, well-nourished female in no acute distress.  LUNGS: Clear to auscultation bilaterally.  HEART: Regular rate and rhythm. ABDOMEN: Soft, nontender, nondistended. Gravid PELVIC: Not indicated EXTREMITIES: No cyanosis, clubbing, or edema, 2+ distal pulses.    Fetal Monitoring:  Baseline: 125 bpm, Variability: moderate, Accelerations: +accels, and Decelerations: Absent    reactive  Labs:  Results for orders placed or performed during the hospital encounter of 03/24/24 (from the past 24 hours)  Glucose, capillary   Collection Time: 03/30/24  7:53 AM  Result Value Ref Range   Glucose-Capillary 63 (L) 70 - 99 mg/dL  Glucose, capillary   Collection Time: 03/30/24  8:12 AM  Result Value Ref Range   Glucose-Capillary 82 70 - 99 mg/dL  Glucose, capillary   Collection Time: 03/30/24  7:59 PM  Result Value Ref Range   Glucose-Capillary  99 70 - 99 mg/dL  Type and screen [facility]   Collection Time: 03/31/24  5:28 AM  Result Value Ref Range   ABO/RH(D) A POS    Antibody Screen NEG    Sample Expiration      04/03/2024,2359 Performed at [facility], [address] [place], [place] [address]     Imaging Studies:    US  MFM UA CORD DOPPLER Result Date: 03/29/2024 ----------------------------------------------------------------------  OBSTETRICS REPORT                       (Signed Final 03/29/2024 05:29 pm) ---------------------------------------------------------------------- Patient Info  ID #:       [id number]                          D.O.B.:  1990/08/23 (32 yrs)(F)  Name:       HARLENE LITTIE Campbell                 Visit Date: 03/29/2024 08:10 am ---------------------------------------------------------------------- Performed By  Attending:        Steffan Keys MD         Ref. Address:     [address]  [place], [place]                                                             [address]  Performed By:     Powell Breen BS       Secondary Phy.:   [facility]                    RDMS                                                             Care  Referred By:      BEBE                [facility]                    PICKENS MD                               [facility] ---------------------------------------------------------------------- Orders  #  Description                           Code        Ordered By  1  US  MFM UA CORD DOPPLER                76820.02    LAWRENCE BASS  2  US  MFM FETAL BPP WO NON               76819.01    JERILYNN BUDDLE     STRESS ----------------------------------------------------------------------  #  Order #                     Accession #                Episode #  1  [id number]                   [id number]                 [id number]  2  [id number]                   [id number]                 [id number]  ---------------------------------------------------------------------- Indications  Maternal care for known or suspected poor      O36.5930  fetal growth, third trimester, single or  unspecified fetus IUGR  Severe preeclampsia, third trimester           O14.13  Pre-existing diabetes, type 2, in pregnancy,   O24.113  third trimester (insulin )  Anxiety during pregnancy, third trimester      O99.343, F41.9  Obesity complicating pregnancy, third          O99.213  trimester (BMI 51.7)  Pre-existing essential hypertension            O10.013  complicating pregnancy, third trimester  (procardia  and labetalol )  LR NIPS, neg Horizon, neg AFP  [redacted] weeks gestation of pregnancy                Z3A.30 ---------------------------------------------------------------------- Fetal Evaluation  Num Of Fetuses:         1  Fetal Heart Rate(bpm):  129  Cardiac Activity:       Observed  Presentation:           Cephalic  Placenta:               Posterior  P. Cord Insertion:      Previously seen  Amniotic Fluid  AFI FV:      Within normal limits  AFI Sum(cm)     %Tile       Largest Pocket(cm)  19.5            75          5.2  RUQ(cm)       RLQ(cm)       LUQ(cm)        LLQ(cm)  5.2           4.6           4.7            5 ---------------------------------------------------------------------- Biophysical Evaluation  Amniotic F.V:   Pocket => 2 cm             F. Tone:        Observed  F. Movement:    Observed                   Score:          8/8  F. Breathing:   Observed ---------------------------------------------------------------------- OB History  Blood Type:   A+  Gravidity:    2          SAB:   1  Living:       0 ---------------------------------------------------------------------- Gestational Age  Best:          30w 6d     Det. By:  Early Ultrasound         EDD:   06/01/24                                      (10/26/23) ---------------------------------------------------------------------- Anatomy  Cranium:               Appears normal          Stomach:                Appears normal, left                                                                        sided  Thoracic:              Appears normal         Kidneys:                Appear normal  Diaphragm:             Appears normal         Bladder:  Appears normal ---------------------------------------------------------------------- Doppler - Fetal Vessels  Umbilical Artery   S/D     %tile      RI    %tile      PI    %tile     PSV    ADFV    RDFV                                                     (cm/s)    4.5   > 97.5     0.8   > 97.5     1.5   > 97.5     35.7     Yes      No ---------------------------------------------------------------------- Cervix Uterus Adnexa  Cervix  Not visualized (advanced GA >24wks)  Uterus  No abnormality visualized.  Right Ovary  Not visualized.  Left Ovary  Not visualized.  Cul De Sac  No free fluid seen.  Adnexa  No abnormality visualized ---------------------------------------------------------------------- Comments  This patient has been hospitalized due to preeclampsia with  severe features.  IUGR was noted on prior ultrasound exam.  A BPP performed today was 8/8.  The total AFI was 19.5 cm.  Vertex presentation.  Doppler studies of the umbilical arteries continues to show an  elevated S/D ratio of 4.5.  An occasional episode of absent  end-diastolic flow was noted.  There were no signs of  reversed end-diastolic flow noted today.  She should continue the plan for inpatient management,  twice weekly umbilical artery Doppler studies and BPP's, and  twice daily NSTs.  Delivery is planned for 34 weeks. ----------------------------------------------------------------------                   Steffan Keys, MD Electronically Signed Final Report   03/29/2024 05:29 pm ----------------------------------------------------------------------   US  MFM FETAL BPP WO NON STRESS Result Date:  03/29/2024 ----------------------------------------------------------------------  OBSTETRICS REPORT                       (Signed Final 03/29/2024 05:29 pm) ---------------------------------------------------------------------- Patient Info  ID #:       [id number]                          D.O.B.:  [birth year] (32 yrs)(F)  Name:       HARLENE CROME Ivanov                 Visit Date: 03/29/2024 08:10 am ---------------------------------------------------------------------- Performed By  Attending:        Steffan Keys MD         Ref. Address:     [address]                                                             [place], [place]  [address]  Performed By:     Powell Breen BS       Secondary Phy.:   [facility]                    RD[facility]                                                             Care  Referred By:      BEBE                [facility] ---------------------------------------------------------------------- Orders  #  Description                           Code        Ordered By  1  US  MFM UA CORD DOPPLER                76820.02    LAWRENCE BASS  2  US  MFM FETAL BPP WO NON               76819.01    JERILYNN BUDDLE     STRESS ----------------------------------------------------------------------  #  Order #                     Accession #                Episode #  1  [id number]                   [id number]                 [id number]  2  [id number]                   [id number]                 [id number] ---------------------------------------------------------------------- Indications  Maternal care for known or suspected poor      O36.5930  fetal growth, third trimester, single or  unspecified fetus IUGR  Severe preeclampsia, third trimester           O14.13  Pre-existing diabetes, type 2, in pregnancy,   O24.113  third trimester (insulin )  Anxiety during  pregnancy, third trimester      O99.343, F41.9  Obesity complicating pregnancy, third          O99.213  trimester (BMI 51.7)  Pre-existing essential hypertension            O10.013  complicating pregnancy, third trimester  (procardia  and labetalol )  LR NIPS, neg Horizon, neg AFP  [redacted] weeks gestation of pregnancy                Z3A.30 ---------------------------------------------------------------------- Fetal Evaluation  Num Of Fetuses:         1  Fetal Heart Rate(bpm):  129  Cardiac Activity:       Observed  Presentation:           Cephalic  Placenta:               Posterior  P. Cord Insertion:      Previously seen  Amniotic Fluid  AFI FV:      Within normal limits  AFI Sum(cm)     %Tile       Largest Pocket(cm)  19.5            75          5.2  RUQ(cm)       RLQ(cm)       LUQ(cm)        LLQ(cm)  5.2           4.6           4.7            5 ---------------------------------------------------------------------- Biophysical Evaluation  Amniotic F.V:   Pocket => 2 cm             F. Tone:        Observed  F. Movement:    Observed                   Score:          8/8  F. Breathing:   Observed ---------------------------------------------------------------------- OB History  Blood Type:   A+  Gravidity:    2          SAB:   1  Living:       0 ---------------------------------------------------------------------- Gestational Age  Best:          30w 6d     Det. By:  Early Ultrasound         EDD:   06/01/24                                      (10/26/23) ---------------------------------------------------------------------- Anatomy  Cranium:               Appears normal         Stomach:                Appears normal, left                                                                        sided  Thoracic:              Appears normal         Kidneys:                Appear normal  Diaphragm:             Appears normal         Bladder:                Appears normal  ---------------------------------------------------------------------- Doppler - Fetal Vessels  Umbilical Artery   S/D     %tile      RI    %tile      PI    %tile     PSV    ADFV    RDFV                                                     (  cm/s)    4.5   > 97.5     0.8   > 97.5     1.5   > 97.5     35.7     Yes      No ---------------------------------------------------------------------- Cervix Uterus Adnexa  Cervix  Not visualized (advanced GA >24wks)  Uterus  No abnormality visualized.  Right Ovary  Not visualized.  Left Ovary  Not visualized.  Cul De Sac  No free fluid seen.  Adnexa  No abnormality visualized ---------------------------------------------------------------------- Comments  This patient has been hospitalized due to preeclampsia with  severe features.  IUGR was noted on prior ultrasound exam.  A BPP performed today was 8/8.  The total AFI was 19.5 cm.  Vertex presentation.  Doppler studies of the umbilical arteries continues to show an  elevated S/D ratio of 4.5.  An occasional episode of absent  end-diastolic flow was noted.  There were no signs of  reversed end-diastolic flow noted today.  She should continue the plan for inpatient management,  twice weekly umbilical artery Doppler studies and BPP's, and  twice daily NSTs.  Delivery is planned for 34 weeks. ----------------------------------------------------------------------                   Steffan Keys, MD Electronically Signed Final Report   03/29/2024 05:29 pm ----------------------------------------------------------------------     Medications:  Scheduled  aspirin   162 mg Oral Daily   bisacodyl  5 mg Oral Once   famotidine  20 mg Oral BID   insulin  aspart  0-15 Units Subcutaneous TID PC   insulin  aspart  6 Units Subcutaneous TID WC   insulin  glargine-yfgn  14 Units Subcutaneous Daily   labetalol   800 mg Oral TID   metFORMIN   1,000 mg Oral BID WC   NIFEdipine   60 mg Oral BID   polyethylene glycol  17 g Oral QODAY   prenatal  multivitamin  1 tablet Oral Q1200   valACYclovir  500 mg Oral BID   I have reviewed the patient's current medications.  ASSESSMENT: G2P0010 [redacted]w[redacted]d with CHTN superimposed preeclampsia Patient Active Problem List   Diagnosis Date Noted   IUGR (intrauterine growth restriction) affecting care of mother 03/25/2024   Chronic hypertension with superimposed preeclampsia 03/24/2024   Severe pre-eclampsia 03/24/2024   Preexisting diabetes complicating pregnancy, antepartum 01/01/2024   Preexisting hypertension complicating pregnancy, antepartum 01/01/2024   Supervision of high risk pregnancy, antepartum 12/18/2023   Maternal morbid obesity, antepartum (HCC) 12/18/2023   LGSIL on Pap smear of cervix 06/21/2022   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/24/2020   Hyperlipidemia associated with type 2 diabetes mellitus (HCC) 10/24/2020   Hypertension associated with diabetes (HCC) 10/24/2020   Type 2 diabetes mellitus without complication, with long-term current use of insulin  (HCC) 10/24/2020    PLAN: 1) Superimposed preeclampsia -Continue Labetalol  800 mg TID and Procardia  XL 60mg  BID -pt asymptomatic -labs q 72hr, remain stable yesterday  2) Fetal well being, FGR -reactive NST, appropriate for current gestational age -continue daily monitoring -growth every 3-4wks, last completed 10/23- vertex/EFW 1323g (8%) with intermittent AEDF -continue dopplers/BPP twice weekly- BPP 8/8 yesterday with stable dopplers - Patient completed BMZ 10/22-23  3) T2DM -on metformin  and semglee  22units daily -SSI as needed - CBGs well controlled  4) Maternal care -PNV daily -routine OB care -valtrex twice daily for suppression  Continue inpatient management with plans for delivery at 34wks or sooner, if clinically indicated.   Continue close monitoring  Nehal Shives 03/31/2024,7:24 AM

## 2024-04-01 ENCOUNTER — Telehealth: Payer: Self-pay | Admitting: Family Medicine

## 2024-04-01 ENCOUNTER — Inpatient Hospital Stay (HOSPITAL_COMMUNITY)

## 2024-04-01 DIAGNOSIS — O99343 Other mental disorders complicating pregnancy, third trimester: Secondary | ICD-10-CM

## 2024-04-01 DIAGNOSIS — O10013 Pre-existing essential hypertension complicating pregnancy, third trimester: Secondary | ICD-10-CM

## 2024-04-01 DIAGNOSIS — Z794 Long term (current) use of insulin: Secondary | ICD-10-CM | POA: Diagnosis not present

## 2024-04-01 DIAGNOSIS — O365930 Maternal care for other known or suspected poor fetal growth, third trimester, not applicable or unspecified: Secondary | ICD-10-CM

## 2024-04-01 DIAGNOSIS — E669 Obesity, unspecified: Secondary | ICD-10-CM

## 2024-04-01 DIAGNOSIS — Z3A31 31 weeks gestation of pregnancy: Secondary | ICD-10-CM

## 2024-04-01 DIAGNOSIS — F419 Anxiety disorder, unspecified: Secondary | ICD-10-CM

## 2024-04-01 DIAGNOSIS — O99213 Obesity complicating pregnancy, third trimester: Secondary | ICD-10-CM

## 2024-04-01 DIAGNOSIS — O1413 Severe pre-eclampsia, third trimester: Secondary | ICD-10-CM | POA: Diagnosis not present

## 2024-04-01 DIAGNOSIS — O24113 Pre-existing diabetes mellitus, type 2, in pregnancy, third trimester: Secondary | ICD-10-CM | POA: Diagnosis not present

## 2024-04-01 DIAGNOSIS — E119 Type 2 diabetes mellitus without complications: Secondary | ICD-10-CM

## 2024-04-01 LAB — GLUCOSE, CAPILLARY
Glucose-Capillary: 88 mg/dL (ref 70–99)
Glucose-Capillary: 107 mg/dL — ABNORMAL HIGH (ref 70–99)

## 2024-04-01 NOTE — Telephone Encounter (Signed)
 Received a call from patient requesting to have all her appointments canceled due to being in the hospital with pre-eclampsia. She is also requesting information on the mommy and me program. She said she thinks that's the name of it.

## 2024-04-01 NOTE — Plan of Care (Signed)
  Problem: Education: Goal: Knowledge of disease or condition will improve Outcome: Progressing Goal: Knowledge of the prescribed therapeutic regimen will improve Outcome: Progressing   Problem: Fluid Volume: Goal: Peripheral tissue perfusion will improve Outcome: Progressing   Problem: Clinical Measurements: Goal: Complications related to disease process, condition or treatment will be avoided or minimized Outcome: Progressing   Problem: Health Behavior/Discharge Planning: Goal: Ability to manage health-related needs will improve Outcome: Progressing   Problem: Clinical Measurements: Goal: Ability to maintain clinical measurements within normal limits will improve Outcome: Progressing Goal: Will remain free from infection Outcome: Progressing Goal: Diagnostic test results will improve Outcome: Progressing Goal: Respiratory complications will improve Outcome: Progressing Goal: Cardiovascular complication will be avoided Outcome: Progressing   Problem: Activity: Goal: Risk for activity intolerance will decrease Outcome: Progressing   Problem: Nutrition: Goal: Adequate nutrition will be maintained Outcome: Progressing   Problem: Coping: Goal: Level of anxiety will decrease Outcome: Progressing   Problem: Elimination: Goal: Will not experience complications related to bowel motility Outcome: Progressing Goal: Will not experience complications related to urinary retention Outcome: Progressing   Problem: Pain Managment: Goal: General experience of comfort will improve and/or be controlled Outcome: Progressing   Problem: Safety: Goal: Ability to remain free from injury will improve Outcome: Progressing   Problem: Skin Integrity: Goal: Risk for impaired skin integrity will decrease Outcome: Progressing   Problem: Education: Goal: Knowledge of disease or condition will improve Outcome: Progressing Goal: Knowledge of the prescribed therapeutic regimen will  improve Outcome: Progressing Goal: Individualized Educational Video(s) Outcome: Progressing   Problem: Clinical Measurements: Goal: Complications related to the disease process, condition or treatment will be avoided or minimized Outcome: Progressing   Problem: Education: Goal: Ability to describe self-care measures that may prevent or decrease complications (Diabetes Survival Skills Education) will improve Outcome: Progressing Goal: Individualized Educational Video(s) Outcome: Progressing   Problem: Coping: Goal: Ability to adjust to condition or change in health will improve Outcome: Progressing   Problem: Fluid Volume: Goal: Ability to maintain a balanced intake and output will improve Outcome: Progressing   Problem: Health Behavior/Discharge Planning: Goal: Ability to identify and utilize available resources and services will improve Outcome: Progressing Goal: Ability to manage health-related needs will improve Outcome: Progressing   Problem: Metabolic: Goal: Ability to maintain appropriate glucose levels will improve Outcome: Progressing   Problem: Nutritional: Goal: Maintenance of adequate nutrition will improve Outcome: Progressing Goal: Progress toward achieving an optimal weight will improve Outcome: Progressing   Problem: Skin Integrity: Goal: Risk for impaired skin integrity will decrease Outcome: Progressing   Problem: Tissue Perfusion: Goal: Adequacy of tissue perfusion will improve Outcome: Progressing

## 2024-04-01 NOTE — Progress Notes (Signed)
 Patient ID: Summer Campbell, female   DOB: Oct 08, 1990, 33 y.o.   MRN: [id number] FACULTY PRACTICE ANTEPARTUM(COMPREHENSIVE) NOTE  Summer Campbell is a 33 y.o. G2P0010 at [redacted]w[redacted]d  who is admitted for chronic HTN with superimposed severe preE.    Fetal presentation is cephalic. Length of Stay:  8  Days  Date of admission:03/24/2024  Subjective: Resting comfortably in bed without any complaints. She denies headache, blurry vision and RUQ pain. Patient reports good fetal movement. She denies contractions, vaginal bleeding and leakage of fluid.  Vitals:  Blood pressure (!) 155/73, pulse 78, temperature 97.9 F (36.6 C), temperature source Oral, resp. rate 18, height 5' 4 (1.626 m), weight (!) 152.5 kg, SpO2 98%. Vitals:   03/31/24 1952 04/01/24 0012 04/01/24 0438 04/01/24 0839  BP: (!) 140/83 (!) 152/88 (!) 159/90 (!) 155/73  Pulse: 79 86 81 78  Resp: 18 17 17 18   Temp: 97.7 F (36.5 C)  97.7 F (36.5 C) 97.9 F (36.6 C)  TempSrc: Oral  Oral Oral  SpO2: 98% 97% 97% 98%  Weight:      Height:       Physical Examination: GENERAL: Well-developed, well-nourished female in no acute distress.  LUNGS: Clear to auscultation bilaterally.  HEART: Regular rate and rhythm. ABDOMEN: Soft, nontender, nondistended. Gravid PELVIC: Not indicated EXTREMITIES: No cyanosis, clubbing, or edema, 2+ distal pulses.  Fetal Monitoring:  Baseline: 130 bpm, Variability: moderate, Accelerations: +accels, and Decelerations: Absent   >> Category 1 FHR tracing  Labs:  Results for orders placed or performed during the hospital encounter of 03/24/24 (from the past 24 hours)  Glucose, capillary   Collection Time: 03/31/24 10:58 AM  Result Value Ref Range   Glucose-Capillary 101 (H) 70 - 99 mg/dL  Glucose, capillary   Collection Time: 03/31/24  7:05 PM  Result Value Ref Range   Glucose-Capillary 63 (L) 70 - 99 mg/dL  Glucose, capillary   Collection Time: 04/01/24  9:03 AM  Result Value Ref Range    Glucose-Capillary 88 70 - 99 mg/dL      Latest Ref Rng & Units 03/30/2024    4:13 AM 03/27/2024    4:31 AM 03/24/2024    3:35 PM  CBC  WBC 4.0 - 10.5 K/uL 14.7  13.9  12.1   Hemoglobin 12.0 - 15.0 g/dL 88.6  88.7  88.2   Hematocrit 36.0 - 46.0 % 34.9  34.1  35.6   Platelets 150 - 400 K/uL 248  256  268       Latest Ref Rng & Units 03/30/2024    4:13 AM 03/27/2024    4:31 AM 03/24/2024    3:35 PM  CMP  Glucose 70 - 99 mg/dL 92  86  878   BUN 6 - 20 mg/dL 17  19  15    Creatinine 0.44 - 1.00 mg/dL 9.42  9.43  9.36   Sodium 135 - 145 mmol/L 137  135  137   Potassium 3.5 - 5.1 mmol/L 4.2  4.4  4.3   Chloride 98 - 111 mmol/L 104  103  104   CO2 22 - 32 mmol/L 23  22  22    Calcium 8.9 - 10.3 mg/dL 8.8  8.4  8.8   Total Protein 6.5 - 8.1 g/dL 5.3  5.6  5.8   Total Bilirubin 0.0 - 1.2 mg/dL 0.3  0.3  0.2   Alkaline Phos 38 - 126 U/L 51  50  52   AST 15 - 41 U/L 18  28  20   ALT 0 - 44 U/L 30  39  21      Imaging Studies:    US  MFM UA CORD DOPPLER Result Date: 03/29/2024 ----------------------------------------------------------------------  OBSTETRICS REPORT                       (Signed Final 03/29/2024 05:29 pm) ---------------------------------------------------------------------- Patient Info  ID #:       [id number]                          D.O.B.:  1991-02-09 (33 yrs)(F)  Name:       Summer Campbell                 Visit Date: 03/29/2024 08:10 am ---------------------------------------------------------------------- Performed By  Attending:        Steffan Keys Campbell         Ref. Address:     [address]                                                             [place], [place]                                                             [address]  Performed By:     Summer Campbell BS       Secondary Phy.:   [facility]                    RDMS                                                             Care  Referred By:      Summer Campbell                Location[facility]                     [facility]                               [facility] ---------------------------------------------------------------------- Orders  #  Description                           Code        Ordered By  1  US  MFM UA CORD DOPPLER                76820.02    Summer Campbell  2  US  MFM FETAL BPP WO NON               23180.98    Summer Campbell     STRESS ----------------------------------------------------------------------  #  Order #  Accession #                Episode #  1  [id number]                   [id number]                 [id number]  2  [id number]                   [id number]                 [id number] ---------------------------------------------------------------------- Indications  Maternal care for known or suspected poor      O36.5930  fetal growth, third trimester, single or  unspecified fetus IUGR  Severe preeclampsia, third trimester           O14.13  Pre-existing diabetes, type 2, in pregnancy,   O24.113  third trimester (insulin )  Anxiety during pregnancy, third trimester      O99.343, F41.9  Obesity complicating pregnancy, third          O99.213  trimester (BMI 51.7)  Pre-existing essential hypertension            O10.013  complicating pregnancy, third trimester  (procardia  and labetalol )  LR NIPS, neg Horizon, neg AFP  [redacted] weeks gestation of pregnancy                Z3A.30 ---------------------------------------------------------------------- Fetal Evaluation  Num Of Fetuses:         1  Fetal Heart Rate(bpm):  129  Cardiac Activity:       Observed  Presentation:           Cephalic  Placenta:               Posterior  P. Cord Insertion:      Previously seen  Amniotic Fluid  AFI FV:      Within normal limits  AFI Sum(cm)     %Tile       Largest Pocket(cm)  19.5            75          5.2  RUQ(cm)       RLQ(cm)       LUQ(cm)        LLQ(cm)  5.2           4.6           4.7            5 ---------------------------------------------------------------------- Biophysical Evaluation  Amniotic  F.V:   Pocket => 2 cm             F. Tone:        Observed  F. Movement:    Observed                   Score:          8/8  F. Breathing:   Observed ---------------------------------------------------------------------- OB History  Blood Type:   A+  Gravidity:    2          SAB:   1  Living:       0 ---------------------------------------------------------------------- Gestational Age  Best:          30w 6d     Det. By:  Early Ultrasound         EDD:   06/01/24                                      (  10/26/23) ---------------------------------------------------------------------- Anatomy  Cranium:               Appears normal         Stomach:                Appears normal, left                                                                        sided  Thoracic:              Appears normal         Kidneys:                Appear normal  Diaphragm:             Appears normal         Bladder:                Appears normal ---------------------------------------------------------------------- Doppler - Fetal Vessels  Umbilical Artery   S/D     %tile      RI    %tile      PI    %tile     PSV    ADFV    RDFV                                                     (cm/s)    4.5   > 97.5     0.8   > 97.5     1.5   > 97.5     35.7     Yes      No ---------------------------------------------------------------------- Cervix Uterus Adnexa  Cervix  Not visualized (advanced GA >24wks)  Uterus  No abnormality visualized.  Right Ovary  Not visualized.  Left Ovary  Not visualized.  Cul De Sac  No free fluid seen.  Adnexa  No abnormality visualized ---------------------------------------------------------------------- Comments  This patient has been hospitalized due to preeclampsia with  severe features.  IUGR was noted on prior ultrasound exam.  A BPP performed today was 8/8.  The total AFI was 19.5 cm.  Vertex presentation.  Doppler studies of the umbilical arteries continues to show an  elevated S/D ratio of 4.5.  An occasional  episode of absent  end-diastolic flow was noted.  There were no signs of  reversed end-diastolic flow noted today.  She should continue the plan for inpatient management,  twice weekly umbilical artery Doppler studies and BPP's, and  twice daily NSTs.  Delivery is planned for 34 weeks. ----------------------------------------------------------------------                   Summer Keys, Campbell Electronically Signed Final Report   03/29/2024 05:29 pm ----------------------------------------------------------------------   US  MFM FETAL BPP WO NON STRESS Result Date: 03/29/2024 ----------------------------------------------------------------------  OBSTETRICS REPORT                       (Signed Final 03/29/2024 05:29 pm) ---------------------------------------------------------------------- Patient Info  ID #:       [id number]  D.O.B.:  16-Feb-1991 (33 yrs)(F)  Name:       Summer Campbell Rather                 Visit Date: 03/29/2024 08:10 am ---------------------------------------------------------------------- Performed By  Attending:        Steffan Keys Campbell         Ref. Address:     [address]                                                             [place], [place]                                                             [address]  Performed By:     Summer Campbell BS       Secondary Phy.:   [facility]                    RDMS                                                             Care  Referred By:      Summer Campbell                Location[facility]                    [facility]                               [facility] ---------------------------------------------------------------------- Orders  #  Description                           Code        Ordered By  1  US  MFM UA CORD DOPPLER                76820.02    Summer Campbell  2  US  MFM FETAL BPP WO NON               76819.01    JERILYNN BUDDLE     STRESS ----------------------------------------------------------------------  #   Order #                     Accession #                Episode #  1  [id number]                   [id number]                 [id number]  2  [id number]                   [id number]  [id number] ---------------------------------------------------------------------- Indications  Maternal care for known or suspected poor      O36.5930  fetal growth, third trimester, single or  unspecified fetus IUGR  Severe preeclampsia, third trimester           O14.13  Pre-existing diabetes, type 2, in pregnancy,   O24.113  third trimester (insulin )  Anxiety during pregnancy, third trimester      O99.343, F41.9  Obesity complicating pregnancy, third          O99.213  trimester (BMI 51.7)  Pre-existing essential hypertension            O10.013  complicating pregnancy, third trimester  (procardia  and labetalol )  LR NIPS, neg Horizon, neg AFP  [redacted] weeks gestation of pregnancy                Z3A.30 ---------------------------------------------------------------------- Fetal Evaluation  Num Of Fetuses:         1  Fetal Heart Rate(bpm):  129  Cardiac Activity:       Observed  Presentation:           Cephalic  Placenta:               Posterior  P. Cord Insertion:      Previously seen  Amniotic Fluid  AFI FV:      Within normal limits  AFI Sum(cm)     %Tile       Largest Pocket(cm)  19.5            75          5.2  RUQ(cm)       RLQ(cm)       LUQ(cm)        LLQ(cm)  5.2           4.6           4.7            5 ---------------------------------------------------------------------- Biophysical Evaluation  Amniotic F.V:   Pocket => 2 cm             F. Tone:        Observed  F. Movement:    Observed                   Score:          8/8  F. Breathing:   Observed ---------------------------------------------------------------------- OB History  Blood Type:   A+  Gravidity:    2          SAB:   1  Living:       0 ---------------------------------------------------------------------- Gestational Age  Best:          30w 6d     Det. By:   Early Ultrasound         EDD:   06/01/24                                      (10/26/23) ---------------------------------------------------------------------- Anatomy  Cranium:               Appears normal         Stomach:                Appears normal, left  sided  Thoracic:              Appears normal         Kidneys:                Appear normal  Diaphragm:             Appears normal         Bladder:                Appears normal ---------------------------------------------------------------------- Doppler - Fetal Vessels  Umbilical Artery   S/D     %tile      RI    %tile      PI    %tile     PSV    ADFV    RDFV                                                     (cm/s)    4.5   > 97.5     0.8   > 97.5     1.5   > 97.5     35.7     Yes      No ---------------------------------------------------------------------- Cervix Uterus Adnexa  Cervix  Not visualized (advanced GA >24wks)  Uterus  No abnormality visualized.  Right Ovary  Not visualized.  Left Ovary  Not visualized.  Cul De Sac  No free fluid seen.  Adnexa  No abnormality visualized ---------------------------------------------------------------------- Comments  This patient has been hospitalized due to preeclampsia with  severe features.  IUGR was noted on prior ultrasound exam.  A BPP performed today was 8/8.  The total AFI was 19.5 cm.  Vertex presentation.  Doppler studies of the umbilical arteries continues to show an  elevated S/D ratio of 4.5.  An occasional episode of absent  end-diastolic flow was noted.  There were no signs of  reversed end-diastolic flow noted today.  She should continue the plan for inpatient management,  twice weekly umbilical artery Doppler studies and BPP's, and  twice daily NSTs.  Delivery is planned for 34 weeks. ----------------------------------------------------------------------                   Summer Keys, Campbell Electronically Signed Final Report    03/29/2024 05:29 pm ----------------------------------------------------------------------     Medications:  Scheduled  aspirin   162 mg Oral Daily   bisacodyl  5 mg Oral Once   famotidine  20 mg Oral BID   insulin  aspart  0-15 Units Subcutaneous TID PC   insulin  aspart  6 Units Subcutaneous TID WC   insulin  glargine-yfgn  12 Units Subcutaneous Daily   labetalol   800 mg Oral TID   metFORMIN   1,000 mg Oral BID WC   NIFEdipine   60 mg Oral BID   prenatal multivitamin  1 tablet Oral Q1200   sodium chloride  flush  3 mL Intravenous Q12H   valACYclovir  500 mg Oral BID   I have reviewed the patient's current medications.  ASSESSMENT: G2P0010 [redacted]w[redacted]d with CHTN superimposed preeclampsia Patient Active Problem List   Diagnosis Date Noted   IUGR (intrauterine growth restriction) affecting care of mother 03/25/2024   Chronic hypertension with superimposed preeclampsia 03/24/2024   Severe pre-eclampsia 03/24/2024   Preexisting diabetes complicating pregnancy, antepartum 01/01/2024   Preexisting hypertension complicating pregnancy, antepartum 01/01/2024   Supervision of high  risk pregnancy, antepartum 12/18/2023   Maternal morbid obesity, antepartum (HCC) 12/18/2023   LGSIL on Pap smear of cervix 06/21/2022   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 Concerned about already being maxed out on two agents.  If BP control is inadequate, may have to move towards delivery. -pt asymptomatic -labs q 72hr, remain stable 10/28  2) Fetal well being, FGR -Category 1 FHR tracing -continue daily monitoring -growth every 3-4wks, last  completed 10/23- vertex/EFW 1323g (8%) with intermittent AEDF -continue dopplers/BPP twice weekly- BPP 8/8 yesterday with stable dopplers - Patient completed BMZ 10/22-23  3) T2DM -Stable blood sugars on metformin  and semglee  22units daily -SSI as needed - CBGs well controlled  4) Maternal care -PNV daily -routine OB care -valtrex twice daily for suppression  Continue inpatient management with plans for delivery at 34wks or sooner, if clinically indicated.   Continue close monitoring  Gloris Hugger, Campbell 04/01/2024,10:35 AM

## 2024-04-01 NOTE — Progress Notes (Signed)
 Nutrition Assessment: Antenatal LOS > 7 days  Nutrition Recommendations: Continue Gestational Carb Modified diet Patient may order double protein portions with meals and may have gestational snacks TID delivered to unit Continue prenatal vitamins Current diet prescription will provide for increased needs of pregnancy   33 year old patient, at 13 2/[redacted] weeks gestation. Admitted with chronic HTN with superimposed pre-eclampsia. Also with T2DM on metformin  and Semglee  (well-controlled).  Patient Active Problem List   Diagnosis Date Noted   IUGR (intrauterine growth restriction) affecting care of mother 03/25/2024   Chronic hypertension with superimposed preeclampsia 03/24/2024   Severe pre-eclampsia 03/24/2024   Preexisting diabetes complicating pregnancy, antepartum 01/01/2024   Preexisting hypertension complicating pregnancy, antepartum 01/01/2024   Supervision of high risk pregnancy, antepartum 12/18/2023   Maternal morbid obesity, antepartum (HCC) 12/18/2023   LGSIL on Pap smear of cervix 06/21/2022   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/24/2020   Hyperlipidemia associated with type 2 diabetes mellitus (HCC) 10/24/2020   Hypertension associated with diabetes (HCC) 10/24/2020   Type 2 diabetes mellitus without complication, with long-term current use of insulin  (HCC) 10/24/2020    Patient reports diet tolerance  as good. She reports good appetite and intake. Reports met with outpatient RD x 2 earlier in pregnancy and reports she really appreciates what she learned from outpatient RD and that it has helped her manage her glucose better. Denies food allergies/intolerances. Reports occasional acid reflux, but is avoiding foods that worsen reflux.  Weight History: Reports pre-pregnancy wt 275 lbs (125 kg) Pre-pregnancy BMI 47.28 kg/m2 Weight gain to date 61 lbs (27.7 kg)  Estimated needs: 3000 - 3200 Kcal/day 140 - 160 g protein/day 3 L fluid requirements   Labs and  medications reviewed   Nutrition Dx: Increased nutrient needs related to pregnancy and fetal growth requirements as evidenced by 31 2/[redacted] weeks gestation.  Patient is assessed at low nutritional risk. Consult Registered Dietitian if concerns arise.

## 2024-04-02 ENCOUNTER — Ambulatory Visit: Admitting: Cardiology

## 2024-04-02 DIAGNOSIS — Z3A31 31 weeks gestation of pregnancy: Secondary | ICD-10-CM | POA: Diagnosis not present

## 2024-04-02 DIAGNOSIS — O113 Pre-existing hypertension with pre-eclampsia, third trimester: Secondary | ICD-10-CM | POA: Diagnosis not present

## 2024-04-02 LAB — COMPREHENSIVE METABOLIC PANEL WITH GFR
ALT: 29 U/L (ref 0–44)
AST: 17 U/L (ref 15–41)
Albumin: 2.1 g/dL — ABNORMAL LOW (ref 3.5–5.0)
Alkaline Phosphatase: 55 U/L (ref 38–126)
Anion gap: 10 (ref 5–15)
BUN: 19 mg/dL (ref 6–20)
CO2: 22 mmol/L (ref 22–32)
Calcium: 8.7 mg/dL — ABNORMAL LOW (ref 8.9–10.3)
Chloride: 104 mmol/L (ref 98–111)
Creatinine, Ser: 0.8 mg/dL (ref 0.44–1.00)
GFR, Estimated: >60 mL/min (ref >60)
Glucose, Bld: 85 mg/dL (ref 70–99)
Potassium: 5 mmol/L (ref 3.5–5.1)
Sodium: 136 mmol/L (ref 135–145)
Total Bilirubin: 0.2 mg/dL (ref 0.0–1.2)
Total Protein: 5.3 g/dL — ABNORMAL LOW (ref 6.5–8.1)

## 2024-04-02 LAB — CBC
HCT: 34.6 % — ABNORMAL LOW (ref 36.0–46.0)
Hemoglobin: 11.5 g/dL — ABNORMAL LOW (ref 12.0–15.0)
MCH: 29 pg (ref 26.0–34.0)
MCHC: 33.2 g/dL (ref 30.0–36.0)
MCV: 87.4 fL (ref 80.0–100.0)
Platelets: 240 K/uL (ref 150–400)
RBC: 3.96 MIL/uL (ref 3.87–5.11)
RDW: 13.2 % (ref 11.5–15.5)
WBC: 14.5 K/uL — ABNORMAL HIGH (ref 4.0–10.5)
nRBC: 0 % (ref 0.0–0.2)

## 2024-04-02 LAB — GLUCOSE, CAPILLARY
Glucose-Capillary: 70 mg/dL (ref 70–99)
Glucose-Capillary: 75 mg/dL (ref 70–99)
Glucose-Capillary: 79 mg/dL (ref 70–99)
Glucose-Capillary: 86 mg/dL (ref 70–99)

## 2024-04-02 MED ORDER — INSULIN GLARGINE-YFGN 100 UNIT/ML ~~LOC~~ SOLN
10.0000 [IU] | Freq: Every day | SUBCUTANEOUS | Status: DC
  Administered 2024-04-02 – 2024-04-13 (×12): 10 [IU] via SUBCUTANEOUS
  Filled 2024-04-02 (×15): qty 0.1

## 2024-04-02 MED ORDER — ESCITALOPRAM OXALATE 10 MG PO TABS
20.0000 mg | ORAL_TABLET | Freq: Every day | ORAL | Status: DC
  Administered 2024-04-02 – 2024-04-14 (×13): 20 mg via ORAL
  Filled 2024-04-02 (×12): qty 2
  Filled 2024-04-02: qty 1

## 2024-04-02 NOTE — Progress Notes (Signed)
 Hypoglycemic Event  CBG: 64 dexcom   Treatment: 4 oz juice/soda  Symptoms: None  Follow-up CBG: Time:0004 CBG Result:70  Possible Reasons for Event: Medication regimen: reduce dose and Unknown  Comments/MD notified: Dr Cleatus Therisa KANDICE Dannielle

## 2024-04-02 NOTE — Telephone Encounter (Signed)
 Patient has been added to mom/baby. I reach out and give her info about our program. In the meantime we can schedule her next ob appointment with a mom baby provider.

## 2024-04-02 NOTE — Progress Notes (Signed)
 Patient ID: Summer Campbell, female   DOB: [birth year], 33 y.o.   MRN: [id number] FACULTY PRACTICE ANTEPARTUM(COMPREHENSIVE) NOTE  Summer Campbell is a 33 y.o. G2P0010 at [redacted]w[redacted]d  who is admitted for chronic HTN with superimposed severe preE.    Fetal presentation is cephalic. Length of Stay:  9  Days  Date of admission:03/24/2024  Subjective: Resting comfortably in bed without any complaints. She denies headache, blurry vision and RUQ pain. Patient reports good fetal movement. She denies contractions, vaginal bleeding and leakage of fluid.  Vitals:  Blood pressure 123/61, pulse 77, temperature 98 F (36.7 C), temperature source Oral, resp. rate 18, height 5' 4 (1.626 m), weight (!) 152.5 kg, SpO2 97%. Vitals:   04/01/24 2215 04/01/24 2331 04/02/24 0004 04/02/24 0448  BP:  (!) 162/80 (!) 150/72 123/61  Pulse: 77 81 84 77  Resp:  18  18  Temp:  98.1 F (36.7 C)  98 F (36.7 C)  TempSrc:  Oral  Oral  SpO2:  98%  97%  Weight:      Height:       Physical Examination: GENERAL: Well-developed, well-nourished female in no acute distress.  LUNGS: Clear to auscultation bilaterally.  HEART: Regular rate and rhythm. ABDOMEN: Soft, nontender, nondistended. Gravid PELVIC: Not indicated EXTREMITIES: No cyanosis, clubbing, or edema, 2+ distal pulses.  Fetal Monitoring:  Baseline: 120 bpm, Variability: moderate, Accelerations: +accels, and Decelerations: Absent   Labs:  Results for orders placed or performed during the hospital encounter of 03/24/24 (from the past 24 hours)  Glucose, capillary   Collection Time: 04/01/24  9:03 AM  Result Value Ref Range   Glucose-Capillary 88 70 - 99 mg/dL  Glucose, capillary   Collection Time: 04/01/24  8:09 PM  Result Value Ref Range   Glucose-Capillary 107 (H) 70 - 99 mg/dL  Glucose, capillary   Collection Time: 04/02/24 12:04 AM  Result Value Ref Range   Glucose-Capillary 70 70 - 99 mg/dL  Comprehensive metabolic panel   Collection Time: 04/02/24   5:51 AM  Result Value Ref Range   Sodium 136 135 - 145 mmol/L   Potassium 5.0 3.5 - 5.1 mmol/L   Chloride 104 98 - 111 mmol/L   CO2 22 22 - 32 mmol/L   Glucose, Bld 85 70 - 99 mg/dL   BUN 19 6 - 20 mg/dL   Creatinine, Ser 9.19 0.44 - 1.00 mg/dL   Calcium 8.7 (L) 8.9 - 10.3 mg/dL   Total Protein 5.3 (L) 6.5 - 8.1 g/dL   Albumin 2.1 (L) 3.5 - 5.0 g/dL   AST 17 15 - 41 U/L   ALT 29 0 - 44 U/L   Alkaline Phosphatase 55 38 - 126 U/L   Total Bilirubin 0.2 0.0 - 1.2 mg/dL   GFR, Estimated >39 >39 mL/min   Anion gap 10 5 - 15  CBC   Collection Time: 04/02/24  5:51 AM  Result Value Ref Range   WBC 14.5 (H) 4.0 - 10.5 K/uL   RBC 3.96 3.87 - 5.11 MIL/uL   Hemoglobin 11.5 (L) 12.0 - 15.0 g/dL   HCT 65.3 (L) 63.9 - 53.9 %   MCV 87.4 80.0 - 100.0 fL   MCH 29.0 26.0 - 34.0 pg   MCHC 33.2 30.0 - 36.0 g/dL   RDW 86.7 88.4 - 84.4 %   Platelets 240 150 - 400 K/uL   nRBC 0.0 0.0 - 0.2 %      Latest Ref Rng & Units 04/02/2024  5:51 AM 03/30/2024    4:13 AM 03/27/2024    4:31 AM  CBC  WBC 4.0 - 10.5 K/uL 14.5  14.7  13.9   Hemoglobin 12.0 - 15.0 g/dL 88.4  88.6  88.7   Hematocrit 36.0 - 46.0 % 34.6  34.9  34.1   Platelets 150 - 400 K/uL 240  248  256       Latest Ref Rng & Units 04/02/2024    5:51 AM 03/30/2024    4:13 AM 03/27/2024    4:31 AM  CMP  Glucose 70 - 99 mg/dL 85  92  86   BUN 6 - 20 mg/dL 19  17  19    Creatinine 0.44 - 1.00 mg/dL 9.19  9.42  9.43   Sodium 135 - 145 mmol/L 136  137  135   Potassium 3.5 - 5.1 mmol/L 5.0  4.2  4.4   Chloride 98 - 111 mmol/L 104  104  103   CO2 22 - 32 mmol/L 22  23  22    Calcium 8.9 - 10.3 mg/dL 8.7  8.8  8.4   Total Protein 6.5 - 8.1 g/dL 5.3  5.3  5.6   Total Bilirubin 0.0 - 1.2 mg/dL 0.2  0.3  0.3   Alkaline Phos 38 - 126 U/L 55  51  50   AST 15 - 41 U/L 17  18  28    ALT 0 - 44 U/L 29  30  39      Imaging Studies:    US  MFM UA CORD DOPPLER Result Date:  03/29/2024 ----------------------------------------------------------------------  OBSTETRICS REPORT                       (Signed Final 03/29/2024 05:29 pm) ---------------------------------------------------------------------- Patient Info  ID #:       [id number]                          D.O.B.:  04/05/1991 (32 yrs)(F)  Name:       Summer Campbell                 Visit Date: 03/29/2024 08:10 am ---------------------------------------------------------------------- Performed By  Attending:        Steffan Keys MD         Ref. Address:     [address]                                                             [place], [place]                                                             [address]  Performed By:     Powell Breen BS       Secondary Phy.:   [facility]                    RDMS  Care  Referred By:      BEBE                [facility]ICK[facility] MD                               [facility] ---------------------------------------------------------------------- Orders  #  Description                           Code        Ordered By  1  US  MFM UA CORD DOPPLER                76820.02    LAWRENCE BASS  2  US  MFM FETAL BPP WO NON               76819.01    JERILYNN BUDDLE     STRESS ----------------------------------------------------------------------  #  Order #                     Accession #                Episode #  1  [id number]                   [id number]                 [id number]  2  [id number]                   [id number]                 [id number] ---------------------------------------------------------------------- Indications  Maternal care for known or suspected poor      O36.5930  fetal growth, third trimester, single or  unspecified fetus IUGR  Severe preeclampsia, third trimester           O14.13  Pre-existing diabetes, type 2, in pregnancy,   O24.113  third trimester (insulin )  Anxiety during  pregnancy, third trimester      O99.343, F41.9  Obesity complicating pregnancy, third          O99.213  trimester (BMI 51.7)  Pre-existing essential hypertension            O10.013  complicating pregnancy, third trimester  (procardia  and labetalol )  LR NIPS, neg Horizon, neg AFP  [redacted] weeks gestation of pregnancy                Z3A.30 ---------------------------------------------------------------------- Fetal Evaluation  Num Of Fetuses:         1  Fetal Heart Rate(bpm):  129  Cardiac Activity:       Observed  Presentation:           Cephalic  Placenta:               Posterior  P. Cord Insertion:      Previously seen  Amniotic Fluid  AFI FV:      Within normal limits  AFI Sum(cm)     %Tile       Largest Pocket(cm)  19.5            75          5.2  RUQ(cm)       RLQ(cm)  LUQ(cm)        LLQ(cm)  5.2           4.6           4.7            5 ---------------------------------------------------------------------- Biophysical Evaluation  Amniotic F.V:   Pocket => 2 cm             F. Tone:        Observed  F. Movement:    Observed                   Score:          8/8  F. Breathing:   Observed ---------------------------------------------------------------------- OB History  Blood Type:   A+  Gravidity:    2          SAB:   1  Living:       0 ---------------------------------------------------------------------- Gestational Age  Best:          30w 6d     Det. By:  Early Ultrasound         EDD:   06/01/24                                      (10/26/23) ---------------------------------------------------------------------- Anatomy  Cranium:               Appears normal         Stomach:                Appears normal, left                                                                        sided  Thoracic:              Appears normal         Kidneys:                Appear normal  Diaphragm:             Appears normal         Bladder:                Appears normal  ---------------------------------------------------------------------- Doppler - Fetal Vessels  Umbilical Artery   S/D     %tile      RI    %tile      PI    %tile     PSV    ADFV    RDFV                                                     (cm/s)    4.5   > 97.5     0.8   > 97.5     1.5   > 97.5     35.7     Yes      No ---------------------------------------------------------------------- Cervix Uterus Adnexa  Cervix  Not visualized (advanced GA >24wks)  Uterus  No abnormality visualized.  Right Ovary  Not visualized.  Left Ovary  Not visualized.  Cul De Sac  No free fluid seen.  Adnexa  No abnormality visualized ---------------------------------------------------------------------- Comments  This patient has been hospitalized due to preeclampsia with  severe features.  IUGR was noted on prior ultrasound exam.  A BPP performed today was 8/8.  The total AFI was 19.5 cm.  Vertex presentation.  Doppler studies of the umbilical arteries continues to show an  elevated S/D ratio of 4.5.  An occasional episode of absent  end-diastolic flow was noted.  There were no signs of  reversed end-diastolic flow noted today.  She should continue the plan for inpatient management,  twice weekly umbilical artery Doppler studies and BPP's, and  twice daily NSTs.  Delivery is planned for 34 weeks. ----------------------------------------------------------------------                   Steffan Keys, MD Electronically Signed Final Report   03/29/2024 05:29 pm ----------------------------------------------------------------------   US  MFM FETAL BPP WO NON STRESS Result Date: 03/29/2024 ----------------------------------------------------------------------  OBSTETRICS REPORT                       (Signed Final 03/29/2024 05:29 pm) ---------------------------------------------------------------------- Patient Info  ID #:       [id number]                          D.O.B.:  03/15/1991 (32 yrs)(F)  Name:       Summer Campbell                  Visit Date: 03/29/2024 08:10 am ---------------------------------------------------------------------- Performed By  Attending:        Steffan Keys MD         Ref. Address:     [address]                                                             [place], [place]                                                             [address]  Performed By:     Powell Breen BS       Secondary Phy.:   [facility]                    RDMS                                                             Care  Referred By:      CHARLIE                [facility] ---------------------------------------------------------------------- Orders  #  Description                           Code        Ordered By  1  US  MFM UA CORD DOPPLER                76820.02    LAWRENCE BASS  2  US  MFM FETAL BPP WO NON               76819.01    JERILYNN BUDDLE     STRESS ----------------------------------------------------------------------  #  Order #                     Accession #                Episode #  1  [id number]                   [id number]                 [id number]  2  [id number]                   [id number]                 [id number] ---------------------------------------------------------------------- Indications  Maternal care for known or suspected poor      O36.5930  fetal growth, third trimester, single or  unspecified fetus IUGR  Severe preeclampsia, third trimester           O14.13  Pre-existing diabetes, type 2, in pregnancy,   O24.113  third trimester (insulin )  Anxiety during pregnancy, third trimester      O99.343, F41.9  Obesity complicating pregnancy, third          O99.213  trimester (BMI 51.7)  Pre-existing essential hypertension            O10.013  complicating pregnancy, third trimester  (procardia  and labetalol )  LR NIPS, neg Horizon, neg AFP  [redacted] weeks gestation of pregnancy                Z3A.30  ---------------------------------------------------------------------- Fetal Evaluation  Num Of Fetuses:         1  Fetal Heart Rate(bpm):  129  Cardiac Activity:       Observed  Presentation:           Cephalic  Placenta:               Posterior  P. Cord Insertion:      Previously seen  Amniotic Fluid  AFI FV:      Within normal limits  AFI Sum(cm)     %Tile       Largest Pocket(cm)  19.5            75          5.2  RUQ(cm)       RLQ(cm)       LUQ(cm)        LLQ(cm)  5.2           4.6           4.7            5 ---------------------------------------------------------------------- Biophysical Evaluation  Amniotic  F.V:   Pocket => 2 cm             F. Tone:        Observed  F. Movement:    Observed                   Score:          8/8  F. Breathing:   Observed ---------------------------------------------------------------------- OB History  Blood Type:   A+  Gravidity:    2          SAB:   1  Living:       0 ---------------------------------------------------------------------- Gestational Age  Best:          30w 6d     Det. By:  Early Ultrasound         EDD:   06/01/24                                      (10/26/23) ---------------------------------------------------------------------- Anatomy  Cranium:               Appears normal         Stomach:                Appears normal, left                                                                        sided  Thoracic:              Appears normal         Kidneys:                Appear normal  Diaphragm:             Appears normal         Bladder:                Appears normal ---------------------------------------------------------------------- Doppler - Fetal Vessels  Umbilical Artery   S/D     %tile      RI    %tile      PI    %tile     PSV    ADFV    RDFV                                                     (cm/s)    4.5   > 97.5     0.8   > 97.5     1.5   > 97.5     35.7     Yes      No ----------------------------------------------------------------------  Cervix Uterus Adnexa  Cervix  Not visualized (advanced GA >24wks)  Uterus  No abnormality visualized.  Right Ovary  Not visualized.  Left Ovary  Not visualized.  Cul De Sac  No free fluid seen.  Adnexa  No abnormality visualized ---------------------------------------------------------------------- Comments  This patient has been hospitalized due to preeclampsia with  severe features.  IUGR was noted on prior ultrasound exam.  A BPP performed today was 8/8.  The total AFI was 19.5 cm.  Vertex presentation.  Doppler studies of the umbilical arteries continues to show an  elevated S/D ratio of 4.5.  An occasional episode of absent  end-diastolic flow was noted.  There were no signs of  reversed end-diastolic flow noted today.  She should continue the plan for inpatient management,  twice weekly umbilical artery Doppler studies and BPP's, and  twice daily NSTs.  Delivery is planned for 34 weeks. ----------------------------------------------------------------------                   Steffan Keys, MD Electronically Signed Final Report   03/29/2024 05:29 pm ----------------------------------------------------------------------     Medications:  Scheduled  aspirin   162 mg Oral Daily   bisacodyl  5 mg Oral Once   famotidine  20 mg Oral BID   insulin  aspart  0-15 Units Subcutaneous TID PC   insulin  aspart  6 Units Subcutaneous TID WC   insulin  glargine-yfgn  10 Units Subcutaneous Daily   labetalol   800 mg Oral TID   metFORMIN   1,000 mg Oral BID WC   NIFEdipine   60 mg Oral BID   prenatal multivitamin  1 tablet Oral Q1200   sodium chloride  flush  3 mL Intravenous Q12H   valACYclovir  500 mg Oral BID   I have reviewed the patient's current medications.  ASSESSMENT: G2P0010 [redacted]w[redacted]d with CHTN superimposed preeclampsia Patient Active Problem List   Diagnosis Date Noted   IUGR (intrauterine growth restriction) affecting care of mother 03/25/2024   Chronic hypertension with superimposed preeclampsia 03/24/2024    Severe pre-eclampsia 03/24/2024   Preexisting diabetes complicating pregnancy, antepartum 01/01/2024   Preexisting hypertension complicating pregnancy, antepartum 01/01/2024   Supervision of high risk pregnancy, antepartum 12/18/2023   Maternal morbid obesity, antepartum (HCC) 12/18/2023   LGSIL on Pap smear of cervix 06/21/2022   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 Concerned about already being maxed out on two agents.  If BP control is inadequate, may have to move towards delivery. -pt asymptomatic -labs q 72hr, remain stable 10/31  2) Fetal well being, FGR -Category 1 FHR tracing -continue daily monitoring -growth every 3-4wks, last completed 10/23- vertex/EFW 1323g (8%) with intermittent AEDF -continue dopplers/BPP twice weekly- BPP 8/8 yesterday with stable dopplers by prelim report - Patient completed BMZ 10/22-23  3) T2DM - Stable blood sugars on metformin  and semglee  10 units daily.  - Aspart 6 with meals - SSI as needed - CBGs well controlled  4) Maternal care -PNV daily -routine OB care -valtrex twice daily for suppression  Continue inpatient management with plans for delivery at 34wks or sooner, if clinically indicated.   Continue close monitoring  Vina Solian, MD 04/02/2024,7:57 AM

## 2024-04-03 ENCOUNTER — Encounter (HOSPITAL_COMMUNITY): Payer: Self-pay | Admitting: Obstetrics and Gynecology

## 2024-04-03 LAB — GLUCOSE, CAPILLARY
Glucose-Capillary: 80 mg/dL (ref 70–99)
Glucose-Capillary: 207 mg/dL — ABNORMAL HIGH (ref 70–99)

## 2024-04-03 NOTE — Plan of Care (Signed)
  Problem: Education: Goal: Knowledge of disease or condition will improve Outcome: Progressing Goal: Knowledge of the prescribed therapeutic regimen will improve Outcome: Progressing   Problem: Fluid Volume: Goal: Peripheral tissue perfusion will improve Outcome: Progressing   Problem: Clinical Measurements: Goal: Complications related to disease process, condition or treatment will be avoided or minimized Outcome: Progressing   Problem: Health Behavior/Discharge Planning: Goal: Ability to manage health-related needs will improve Outcome: Progressing   Problem: Clinical Measurements: Goal: Ability to maintain clinical measurements within normal limits will improve Outcome: Progressing Goal: Will remain free from infection Outcome: Progressing Goal: Diagnostic test results will improve Outcome: Progressing Goal: Respiratory complications will improve Outcome: Progressing Goal: Cardiovascular complication will be avoided Outcome: Progressing   Problem: Activity: Goal: Risk for activity intolerance will decrease Outcome: Progressing   Problem: Nutrition: Goal: Adequate nutrition will be maintained Outcome: Progressing   Problem: Coping: Goal: Level of anxiety will decrease Outcome: Progressing   Problem: Elimination: Goal: Will not experience complications related to bowel motility Outcome: Progressing Goal: Will not experience complications related to urinary retention Outcome: Progressing   Problem: Pain Managment: Goal: General experience of comfort will improve and/or be controlled Outcome: Progressing   Problem: Safety: Goal: Ability to remain free from injury will improve Outcome: Progressing   Problem: Skin Integrity: Goal: Risk for impaired skin integrity will decrease Outcome: Progressing   Problem: Education: Goal: Knowledge of disease or condition will improve Outcome: Progressing Goal: Knowledge of the prescribed therapeutic regimen will  improve Outcome: Progressing Goal: Individualized Educational Video(s) Outcome: Progressing   Problem: Clinical Measurements: Goal: Complications related to the disease process, condition or treatment will be avoided or minimized Outcome: Progressing   Problem: Education: Goal: Ability to describe self-care measures that may prevent or decrease complications (Diabetes Survival Skills Education) will improve Outcome: Progressing Goal: Individualized Educational Video(s) Outcome: Progressing   Problem: Coping: Goal: Ability to adjust to condition or change in health will improve Outcome: Progressing   Problem: Fluid Volume: Goal: Ability to maintain a balanced intake and output will improve Outcome: Progressing   Problem: Health Behavior/Discharge Planning: Goal: Ability to identify and utilize available resources and services will improve Outcome: Progressing Goal: Ability to manage health-related needs will improve Outcome: Progressing   Problem: Metabolic: Goal: Ability to maintain appropriate glucose levels will improve Outcome: Progressing   Problem: Nutritional: Goal: Maintenance of adequate nutrition will improve Outcome: Progressing Goal: Progress toward achieving an optimal weight will improve Outcome: Progressing   Problem: Skin Integrity: Goal: Risk for impaired skin integrity will decrease Outcome: Progressing   Problem: Tissue Perfusion: Goal: Adequacy of tissue perfusion will improve Outcome: Progressing

## 2024-04-03 NOTE — Progress Notes (Signed)
 Patient ID: Summer Campbell, female   DOB: 1991-03-30, 33 y.o.   MRN: [id number] FACULTY PRACTICE ANTEPARTUM(COMPREHENSIVE) NOTE  Summer Campbell is a 33 y.o. G2P0010 at [redacted]w[redacted]d  who is admitted for chronic HTN with superimposed severe preE.  Pregnancy is also c/b FGR w/ iAEDF  Fetal presentation is cephalic. Length of Stay:  10  Days  Date of admission:03/24/2024  Subjective: Doing well this morning without complaint. Denies HA, visual changes, CP/SOB, RUQ/epigastric pain. Denies ctx, VB, LOF. +FM  Vitals:  Blood pressure (!) 145/74, pulse 85, temperature 98 F (36.7 C), temperature source Oral, resp. rate 17, height 5' 4 (1.626 m), weight (!) 152.5 kg, SpO2 99%. Vitals:   04/02/24 1944 04/02/24 2223 04/03/24 0750 04/03/24 1203  BP: (!) 132/56 (!) 144/65 (!) 151/69 (!) 145/74  Pulse: 86 82 83 85  Resp: 18 19 18 17   Temp: 98.2 F (36.8 C) 97.9 F (36.6 C) 97.7 F (36.5 C) 98 F (36.7 C)  TempSrc: Oral Oral Oral Oral  SpO2: 98% 100% 99% 99%  Weight:      Height:       Physical Examination: GENERAL: Well-developed, well-nourished female in no acute distress.  LUNGS: Normal effort HEART: Regular rate  ABDOMEN: Soft, nontender, Gravid  Fetal Monitoring:  Baseline: 135 bpm, Variability: moderate, Accelerations: 10x10, and Decelerations: 1 variable Appropriate for gestational age  Labs:  Results for orders placed or performed during the hospital encounter of 03/24/24 (from the past 24 hours)  Glucose, capillary   Collection Time: 04/02/24  9:29 PM  Result Value Ref Range   Glucose-Capillary 86 70 - 99 mg/dL  Glucose, capillary   Collection Time: 04/03/24  7:47 AM  Result Value Ref Range   Glucose-Capillary 80 70 - 99 mg/dL      Latest Ref Rng & Units 04/02/2024    5:51 AM 03/30/2024    4:13 AM 03/27/2024    4:31 AM  CBC  WBC 4.0 - 10.5 K/uL 14.5  14.7  13.9   Hemoglobin 12.0 - 15.0 g/dL 88.4  88.6  88.7   Hematocrit 36.0 - 46.0 % 34.6  34.9  34.1   Platelets 150 - 400  K/uL 240  248  256       Latest Ref Rng & Units 04/02/2024    5:51 AM 03/30/2024    4:13 AM 03/27/2024    4:31 AM  CMP  Glucose 70 - 99 mg/dL 85  92  86   BUN 6 - 20 mg/dL 19  17  19    Creatinine 0.44 - 1.00 mg/dL 9.19  9.42  9.43   Sodium 135 - 145 mmol/L 136  137  135   Potassium 3.5 - 5.1 mmol/L 5.0  4.2  4.4   Chloride 98 - 111 mmol/L 104  104  103   CO2 22 - 32 mmol/L 22  23  22    Calcium 8.9 - 10.3 mg/dL 8.7  8.8  8.4   Total Protein 6.5 - 8.1 g/dL 5.3  5.3  5.6   Total Bilirubin 0.0 - 1.2 mg/dL 0.2  0.3  0.3   Alkaline Phos 38 - 126 U/L 55  51  50   AST 15 - 41 U/L 17  18  28    ALT 0 - 44 U/L 29  30  39      Imaging Studies:    US  MFM UA CORD DOPPLER Result Date: 04/02/2024 ----------------------------------------------------------------------  OBSTETRICS REPORT                       (  Signed Final 04/02/2024 09:54 am) ---------------------------------------------------------------------- Patient Info  ID #:       [id number]                          D.O.B.:  01/10/1991 (33 yrs)(F)  Name:       Summer Campbell                 Visit Date: 04/01/2024 03:40 pm ---------------------------------------------------------------------- Performed By  Attending:        Steffan Keys MD         Ref. Address:     [address]                                                             [place], [place]                                                             [address]  Performed By:     Burnard CROME Custard          Secondary Phy.:   [facility]                    [facility]                                                             Care  Referred By:      BEBE                [facility]ICKENS MD                               [facility] ---------------------------------------------------------------------- Orders  #  Description                           Code        Ordered By  1  US  MFM UA CORD DOPPLER                76820.02    UGONNA ANYANWU  2  US  MFM FETAL BPP  WO NON               23180.98    UGONNA ANYANWU     STRESS ----------------------------------------------------------------------  #  Order #                     Accession #                Episode #  1  [id number]  [id number]                 [id number]  2  [id number]                   [id number]                 [id number] ---------------------------------------------------------------------- Indications  Severe preeclampsia, third trimester           O14.13  Pre-existing diabetes, type 2, in pregnancy,   O24.113  third trimester (insulin )  Maternal care for known or suspected poor      O36.5930  fetal growth, third trimester, single or  unspecified fetus IUGR  Pre-existing essential hypertension            O10.013  complicating pregnancy, third trimester  (procardia  and labetalol )  Obesity complicating pregnancy, third          O99.213  trimester (BMI 51.7)  [redacted] weeks gestation of pregnancy                Z3A.31  Anxiety during pregnancy, third trimester      O99.343, F41.9 ---------------------------------------------------------------------- Fetal Evaluation  Num Of Fetuses:         1  Fetal Heart Rate(bpm):  146  Cardiac Activity:       Observed  Presentation:           Cephalic  Placenta:               Posterior  Amniotic Fluid  AFI FV:      Within normal limits  AFI Sum(cm)     %Tile       Largest Pocket(cm)  10.4            18          3.8  RUQ(cm)       RLQ(cm)       LUQ(cm)        LLQ(cm)  3.8           1.7           1.5            3.4 ---------------------------------------------------------------------- Biophysical Evaluation  Amniotic F.V:   Within normal limits       F. Tone:        Observed  F. Movement:    Observed                   Score:          8/8  F. Breathing:   Observed ---------------------------------------------------------------------- OB History  Blood Type:   A+  Gravidity:    2          SAB:   1  Living:       0  ---------------------------------------------------------------------- Gestational Age  Best:          31w 2d     Det. By:  Early Ultrasound         EDD:   06/01/24                                      (10/26/23) ---------------------------------------------------------------------- Anatomy  Stomach:               Appears normal, left   Bladder:                Appears normal  sided ---------------------------------------------------------------------- Doppler - Fetal Vessels  Umbilical Artery   S/D     %tile      RI    %tile      PI    %tile     PSV    ADFV    RDFV                                                     (cm/s)    4.9   > 97.5     0.8   > 97.5     1.5   > 97.5     38.5     Yes      No ---------------------------------------------------------------------- Comments  This patient has been hospitalized due to preeclampsia with  severe features.  IUGR was noted on her prior ultrasound  exam.  A BPP performed today was 8/8.  The total AFI was 11.4 cm.  Vertex presentation.  Doppler studies of the umbilical arteries continues to show an  elevated S/D ratio of 4.9.  An occasional episode of absent  end-diastolic flow was noted.  There were no signs of  reversed end-diastolic flow noted today.  She should continue the plan for inpatient management,  twice weekly umbilical artery Doppler studies and BPP's, and  twice daily NSTs.  Delivery is planned for 34 weeks. ----------------------------------------------------------------------                   Steffan Keys, MD Electronically Signed Final Report   04/02/2024 09:54 am ----------------------------------------------------------------------   US  MFM FETAL BPP WO NON STRESS Result Date: 04/02/2024 ----------------------------------------------------------------------  OBSTETRICS REPORT                       (Signed Final 04/02/2024 09:54 am) ---------------------------------------------------------------------- Patient Info  ID #:        [id number]                          D.O.B.:  06/19/1990 (32 yrs)(F)  Name:       Summer Campbell                 Visit Date: 04/01/2024 03:40 pm ---------------------------------------------------------------------- Performed By  Attending:        Steffan Keys MD         Ref. Address:     [address]                                                             [place], [place]                                                             [address]  Performed By:     Burnard CROME Custard          Secondary Phy.:   [facility]  [facility]                                                             Care  Referred By:      BEBE                [facility] ---------------------------------------------------------------------- Orders  #  Description                           Code        Ordered By  1  US  MFM UA CORD DOPPLER                76820.02    UGONNA ANYANWU  2  US  MFM FETAL BPP WO NON               76819.01    UGONNA ANYANWU     STRESS ----------------------------------------------------------------------  #  Order #                     Accession #                Episode #  1  [id number]                   [id number]                 [id number]  2  [id number]                   [id number]                 [id number] ---------------------------------------------------------------------- Indications  Severe preeclampsia, third trimester           O14.13  Pre-existing diabetes, type 2, in pregnancy,   O24.113  third trimester (insulin )  Maternal care for known or suspected poor      O36.5930  fetal growth, third trimester, single or  unspecified fetus IUGR  Pre-existing essential hypertension            O10.013  complicating pregnancy, third trimester  (procardia  and labetalol )  Obesity complicating pregnancy, third          O99.213  trimester (BMI 51.7)  [redacted] weeks gestation of pregnancy                Z3A.31  Anxiety during  pregnancy, third trimester      O99.343, F41.9 ---------------------------------------------------------------------- Fetal Evaluation  Num Of Fetuses:         1  Fetal Heart Rate(bpm):  146  Cardiac Activity:       Observed  Presentation:           Cephalic  Placenta:               Posterior  Amniotic Fluid  AFI FV:      Within normal limits  AFI Sum(cm)     %  Tile       Largest Pocket(cm)  10.4            18          3.8  RUQ(cm)       RLQ(cm)       LUQ(cm)        LLQ(cm)  3.8           1.7           1.5            3.4 ---------------------------------------------------------------------- Biophysical Evaluation  Amniotic F.V:   Within normal limits       F. Tone:        Observed  F. Movement:    Observed                   Score:          8/8  F. Breathing:   Observed ---------------------------------------------------------------------- OB History  Blood Type:   A+  Gravidity:    2          SAB:   1  Living:       0 ---------------------------------------------------------------------- Gestational Age  Best:          31w 2d     Det. By:  Early Ultrasound         EDD:   06/01/24                                      (10/26/23) ---------------------------------------------------------------------- Anatomy  Stomach:               Appears normal, left   Bladder:                Appears normal                         sided ---------------------------------------------------------------------- Doppler - Fetal Vessels  Umbilical Artery   S/D     %tile      RI    %tile      PI    %tile     PSV    ADFV    RDFV                                                     (cm/s)    4.9   > 97.5     0.8   > 97.5     1.5   > 97.5     38.5     Yes      No ---------------------------------------------------------------------- Comments  This patient has been hospitalized due to preeclampsia with  severe features.  IUGR was noted on her prior ultrasound  exam.  A BPP performed today was 8/8.  The total AFI was 11.4 cm.  Vertex presentation.   Doppler studies of the umbilical arteries continues to show an  elevated S/D ratio of 4.9.  An occasional episode of absent  end-diastolic flow was noted.  There were no signs of  reversed end-diastolic flow noted today.  She should continue the plan for inpatient management,  twice weekly umbilical artery Doppler studies and BPP's, and  twice daily NSTs.  Delivery is planned for 34 weeks. ----------------------------------------------------------------------  Steffan Keys, MD Electronically Signed Final Report   04/02/2024 09:54 am ----------------------------------------------------------------------     Medications:  Scheduled  aspirin   162 mg Oral Daily   bisacodyl  5 mg Oral Once   escitalopram  20 mg Oral Daily   famotidine  20 mg Oral BID   insulin  aspart  0-15 Units Subcutaneous TID PC   insulin  aspart  6 Units Subcutaneous TID WC   insulin  glargine-yfgn  10 Units Subcutaneous Daily   labetalol   800 mg Oral TID   metFORMIN   1,000 mg Oral BID WC   NIFEdipine   60 mg Oral BID   prenatal multivitamin  1 tablet Oral Q1200   sodium chloride  flush  3 mL Intravenous Q12H   valACYclovir  500 mg Oral BID   I have reviewed the patient's current medications.  ASSESSMENT: G2P0010 [redacted]w[redacted]d with CHTN superimposed preeclampsia Patient Active Problem List   Diagnosis Date Noted   IUGR (intrauterine growth restriction) affecting care of mother 03/25/2024   Chronic hypertension with superimposed preeclampsia 03/24/2024   Severe pre-eclampsia 03/24/2024   Preexisting diabetes complicating pregnancy, antepartum 01/01/2024   Preexisting hypertension complicating pregnancy, antepartum 01/01/2024   Supervision of high risk pregnancy, antepartum 12/18/2023   Maternal morbid obesity, antepartum (HCC) 12/18/2023   LGSIL on Pap smear of cervix 06/21/2022   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/24/2020   Hyperlipidemia associated with type 2 diabetes mellitus (HCC) 10/24/2020    Hypertension associated with diabetes (HCC) 10/24/2020   Type 2 diabetes mellitus without complication, with long-term current use of insulin  (HCC) 10/24/2020    PLAN: 1) CHTN with superimposed severe preeclampsia -Continue Labetalol  800 mg TID and Procardia  XL 60mg  BID; continues to have mild range blood pressures. If we have severe range pressures, will move towards delivery -pt asymptomatic -labs q 72hr, remain stable 10/31  2) Fetal well being, FGR -NST AGA, continue BID NSTs -growth 10/23- vertex/EFW 1323g (8%) with intermittent AEDF -continue dopplers/BPP twice weekly- last 10/30 with stable iAEDF, BPP 8/8 - s/p BMZ 10/22-23  3) T2DM - Continue metformin  1g BID, semglee  10 units daily and novolog  6u TID meals with SSI for coverage - CBGs well controlled  4) Maternal care -PNV daily -routine OB care -valtrex twice daily for suppression  Continue inpatient management with plans for delivery at 34wks or sooner as clinically indicated.   Continue close monitoring  Kieth JAYSON Carolin, MD 04/03/2024,12:18 PM

## 2024-04-04 ENCOUNTER — Inpatient Hospital Stay (HOSPITAL_COMMUNITY)

## 2024-04-04 DIAGNOSIS — O1413 Severe pre-eclampsia, third trimester: Secondary | ICD-10-CM

## 2024-04-04 DIAGNOSIS — O24113 Pre-existing diabetes mellitus, type 2, in pregnancy, third trimester: Secondary | ICD-10-CM | POA: Diagnosis not present

## 2024-04-04 DIAGNOSIS — Z3A31 31 weeks gestation of pregnancy: Secondary | ICD-10-CM

## 2024-04-04 DIAGNOSIS — O289 Unspecified abnormal findings on antenatal screening of mother: Secondary | ICD-10-CM | POA: Diagnosis not present

## 2024-04-04 DIAGNOSIS — O365930 Maternal care for other known or suspected poor fetal growth, third trimester, not applicable or unspecified: Secondary | ICD-10-CM | POA: Diagnosis not present

## 2024-04-04 DIAGNOSIS — Z794 Long term (current) use of insulin: Secondary | ICD-10-CM

## 2024-04-04 DIAGNOSIS — E119 Type 2 diabetes mellitus without complications: Secondary | ICD-10-CM

## 2024-04-04 LAB — GLUCOSE, CAPILLARY
Glucose-Capillary: 85 mg/dL (ref 70–99)
Glucose-Capillary: 77 mg/dL (ref 70–99)
Glucose-Capillary: 120 mg/dL — ABNORMAL HIGH (ref 70–99)
Glucose-Capillary: 107 mg/dL — ABNORMAL HIGH (ref 70–99)

## 2024-04-04 MED ORDER — INSULIN ASPART 100 UNIT/ML IJ SOLN
4.0000 [IU] | Freq: Three times a day (TID) | INTRAMUSCULAR | Status: DC
  Administered 2024-04-04 – 2024-04-09 (×16): 4 [IU] via SUBCUTANEOUS
  Filled 2024-04-04 (×2): qty 4

## 2024-04-04 NOTE — Progress Notes (Signed)
 Hypoglycemic Event  CBG: 67  Treatment: 4 oz juice/soda  Symptoms: None  Follow-up CBG: Time:  0157 CBG Result: 85   MD Notified    Enos GORMAN Gentry

## 2024-04-04 NOTE — Progress Notes (Signed)
 Pt. Called nurse's station reporting that her Dexcom alerted her of a low blood sugar. A fingerstick was obtained at that time, with a CBG of 77. No further treatment was required.

## 2024-04-04 NOTE — Progress Notes (Signed)
 Pt. told RN at 0130 that around 0050 her dexcom woke her up alerting her blood sugar was 45. Pt. Did not notify RN and drank one orange juice. At 0130 RN discovered Pt. Blood sugar 67. RN gave Pt. Apple juice and educated Pt. to tell RN if blood sugar is low.

## 2024-04-04 NOTE — Progress Notes (Signed)
 Patient ID: Summer Campbell, female   DOB: [birth year], 33 y.o.   MRN: [id number] FACULTY PRACTICE ANTEPARTUM(COMPREHENSIVE) NOTE  Summer Campbell is a 33 y.o. G2P0010 at [redacted]w[redacted]d  who is admitted for chronic HTN with superimposed severe preE.  Pregnancy is also c/b FGR w/ iAEDF  Fetal presentation is cephalic. Length of Stay:  11  Days  Date of admission:03/24/2024  Subjective: No concerns this morning, feeling well. Has some low blood sugars last night after dinner that came up with PO intake. Denies HA, visual changes, CP/SOB, RUQ/epigastric pain. Denies ctx, VB, LOF. +FM  Vitals:  Blood pressure (!) 153/73, pulse 81, temperature 98.1 F (36.7 C), temperature source Oral, resp. rate 18, height 5' 4 (1.626 m), weight (!) 152.5 kg, SpO2 100%. Vitals:   04/04/24 0438 04/04/24 0835 04/04/24 0909 04/04/24 1159  BP: (!) 156/80 (!) 159/77 (!) 156/70 (!) 153/73  Pulse: 80 80 79 81  Resp: 18 18  18   Temp: 97.7 F (36.5 C) 97.8 F (36.6 C)  98.1 F (36.7 C)  TempSrc: Oral Oral  Oral  SpO2: 100% 100%  100%  Weight:      Height:       Physical Examination: GENERAL: Well-developed, well-nourished female in no acute distress.  LUNGS: Normal effort HEART: Regular rate  ABDOMEN: Soft, nontender, Gravid  Fetal Monitoring:  Baseline: 140 bpm, Variability: moderate, Accelerations: absent, and Decelerations: none  Labs:  Results for orders placed or performed during the hospital encounter of 03/24/24 (from the past 24 hours)  Glucose, capillary   Collection Time: 04/03/24  9:35 PM  Result Value Ref Range   Glucose-Capillary 207 (H) 70 - 99 mg/dL  Glucose, capillary   Collection Time: 04/04/24  1:57 AM  Result Value Ref Range   Glucose-Capillary 85 70 - 99 mg/dL  Glucose, capillary   Collection Time: 04/04/24  3:24 AM  Result Value Ref Range   Glucose-Capillary 77 70 - 99 mg/dL      Latest Ref Rng & Units 04/02/2024    5:51 AM 03/30/2024    4:13 AM 03/27/2024    4:31 AM  CBC  WBC 4.0  - 10.5 K/uL 14.5  14.7  13.9   Hemoglobin 12.0 - 15.0 g/dL 88.4  88.6  88.7   Hematocrit 36.0 - 46.0 % 34.6  34.9  34.1   Platelets 150 - 400 K/uL 240  248  256       Latest Ref Rng & Units 04/02/2024    5:51 AM 03/30/2024    4:13 AM 03/27/2024    4:31 AM  CMP  Glucose 70 - 99 mg/dL 85  92  86   BUN 6 - 20 mg/dL 19  17  19    Creatinine 0.44 - 1.00 mg/dL 9.19  9.42  9.43   Sodium 135 - 145 mmol/L 136  137  135   Potassium 3.5 - 5.1 mmol/L 5.0  4.2  4.4   Chloride 98 - 111 mmol/L 104  104  103   CO2 22 - 32 mmol/L 22  23  22    Calcium 8.9 - 10.3 mg/dL 8.7  8.8  8.4   Total Protein 6.5 - 8.1 g/dL 5.3  5.3  5.6   Total Bilirubin 0.0 - 1.2 mg/dL 0.2  0.3  0.3   Alkaline Phos 38 - 126 U/L 55  51  50   AST 15 - 41 U/L 17  18  28    ALT 0 - 44 U/L 29  30  39      Imaging Studies:    US  MFM UA CORD DOPPLER Result Date: 04/02/2024 ----------------------------------------------------------------------  OBSTETRICS REPORT                       (Signed Final 04/02/2024 09:54 am) ---------------------------------------------------------------------- Patient Info  ID #:       [id number]                          D.O.B.:  Jan 11, 1991 (32 yrs)(F)  Name:       Summer Campbell                 Visit Date: 04/01/2024 03:40 pm ---------------------------------------------------------------------- Performed By  Attending:        Steffan Keys MD         Ref. Address:     [address]                                                             [place], [place]                                                             [address]  Performed By:     Burnard CROME Custard          Secondary Phy.:   [facility]                    RDMS                                                             Care  Referred By:      BEBE                [facility]ICKENS MD                               [facility] ---------------------------------------------------------------------- Orders  #   Description                           Code        Ordered By  1  US  MFM UA CORD DOPPLER                76820.02    UGONNA ANYANWU  2  US  MFM FETAL BPP WO NON               23180.98    UGONNA ANYANWU     STRESS ----------------------------------------------------------------------  #  Order #  Accession #                Episode #  1  [id number]                   [id number]                 [id number]  2  [id number]                   [id number]                 [id number] ---------------------------------------------------------------------- Indications  Severe preeclampsia, third trimester           O14.13  Pre-existing diabetes, type 2, in pregnancy,   O24.113  third trimester (insulin )  Maternal care for known or suspected poor      O36.5930  fetal growth, third trimester, single or  unspecified fetus IUGR  Pre-existing essential hypertension            O10.013  complicating pregnancy, third trimester  (procardia  and labetalol )  Obesity complicating pregnancy, third          O99.213  trimester (BMI 51.7)  [redacted] weeks gestation of pregnancy                Z3A.31  Anxiety during pregnancy, third trimester      O99.343, F41.9 ---------------------------------------------------------------------- Fetal Evaluation  Num Of Fetuses:         1  Fetal Heart Rate(bpm):  146  Cardiac Activity:       Observed  Presentation:           Cephalic  Placenta:               Posterior  Amniotic Fluid  AFI FV:      Within normal limits  AFI Sum(cm)     %Tile       Largest Pocket(cm)  10.4            18          3.8  RUQ(cm)       RLQ(cm)       LUQ(cm)        LLQ(cm)  3.8           1.7           1.5            3.4 ---------------------------------------------------------------------- Biophysical Evaluation  Amniotic F.V:   Within normal limits       F. Tone:        Observed  F. Movement:    Observed                   Score:          8/8  F. Breathing:   Observed  ---------------------------------------------------------------------- OB History  Blood Type:   A+  Gravidity:    2          SAB:   1  Living:       0 ---------------------------------------------------------------------- Gestational Age  Best:          31w 2d     Det. By:  Early Ultrasound         EDD:   06/01/24                                      (10/26/23) ---------------------------------------------------------------------- Anatomy  Stomach:  Appears normal, left   Bladder:                Appears normal                         sided ---------------------------------------------------------------------- Doppler - Fetal Vessels  Umbilical Artery   S/D     %tile      RI    %tile      PI    %tile     PSV    ADFV    RDFV                                                     (cm/s)    4.9   > 97.5     0.8   > 97.5     1.5   > 97.5     38.5     Yes      No ---------------------------------------------------------------------- Comments  This patient has been hospitalized due to preeclampsia with  severe features.  IUGR was noted on her prior ultrasound  exam.  A BPP performed today was 8/8.  The total AFI was 11.4 cm.  Vertex presentation.  Doppler studies of the umbilical arteries continues to show an  elevated S/D ratio of 4.9.  An occasional episode of absent  end-diastolic flow was noted.  There were no signs of  reversed end-diastolic flow noted today.  She should continue the plan for inpatient management,  twice weekly umbilical artery Doppler studies and BPP's, and  twice daily NSTs.  Delivery is planned for 34 weeks. ----------------------------------------------------------------------                   Steffan Keys, MD Electronically Signed Final Report   04/02/2024 09:54 am ----------------------------------------------------------------------   US  MFM FETAL BPP WO NON STRESS Result Date: 04/02/2024 ----------------------------------------------------------------------  OBSTETRICS REPORT                        (Signed Final 04/02/2024 09:54 am) ---------------------------------------------------------------------- Patient Info  ID #:       [id number]                          D.O.B.:  15-Nov-1990 (32 yrs)(F)  Name:       Summer Campbell                 Visit Date: 04/01/2024 03:40 pm ---------------------------------------------------------------------- Performed By  Attending:        Steffan Keys MD         Ref. Address:     [address]                                                             [place], [place]  [address]  Performed By:     Burnard LITTIE Custard          Secondary Phy.:   [facility]                    RDMS                                                             Care  Referred By:      BEBE                [facility] ---------------------------------------------------------------------- Orders  #  Description                           Code        Ordered By  1  US  MFM UA CORD DOPPLER                76820.02    UGONNA ANYANWU  2  US  MFM FETAL BPP WO NON               76819.01    UGONNA ANYANWU     STRESS ----------------------------------------------------------------------  #  Order #                     Accession #                Episode #  1  [id number]                   [id number]                 [id number]  2  [id number]                   [id number]                 [id number] ---------------------------------------------------------------------- Indications  Severe preeclampsia, third trimester           O14.13  Pre-existing diabetes, type 2, in pregnancy,   O24.113  third trimester (insulin )  Maternal care for known or suspected poor      O36.5930  fetal growth, third trimester, single or  unspecified fetus IUGR  Pre-existing essential hypertension            O10.013  complicating pregnancy, third trimester  (procardia  and labetalol )   Obesity complicating pregnancy, third          O99.213  trimester (BMI 51.7)  [redacted] weeks gestation of pregnancy                Z3A.31  Anxiety during pregnancy, third trimester      O99.343, F41.9 ---------------------------------------------------------------------- Fetal Evaluation  Num Of Fetuses:         1  Fetal Heart Rate(bpm):  146  Cardiac Activity:       Observed  Presentation:  Cephalic  Placenta:               Posterior  Amniotic Fluid  AFI FV:      Within normal limits  AFI Sum(cm)     %Tile       Largest Pocket(cm)  10.4            18          3.8  RUQ(cm)       RLQ(cm)       LUQ(cm)        LLQ(cm)  3.8           1.7           1.5            3.4 ---------------------------------------------------------------------- Biophysical Evaluation  Amniotic F.V:   Within normal limits       F. Tone:        Observed  F. Movement:    Observed                   Score:          8/8  F. Breathing:   Observed ---------------------------------------------------------------------- OB History  Blood Type:   A+  Gravidity:    2          SAB:   1  Living:       0 ---------------------------------------------------------------------- Gestational Age  Best:          31w 2d     Det. By:  Early Ultrasound         EDD:   06/01/24                                      (10/26/23) ---------------------------------------------------------------------- Anatomy  Stomach:               Appears normal, left   Bladder:                Appears normal                         sided ---------------------------------------------------------------------- Doppler - Fetal Vessels  Umbilical Artery   S/D     %tile      RI    %tile      PI    %tile     PSV    ADFV    RDFV                                                     (cm/s)    4.9   > 97.5     0.8   > 97.5     1.5   > 97.5     38.5     Yes      No ---------------------------------------------------------------------- Comments  This patient has been hospitalized due to preeclampsia with   severe features.  IUGR was noted on her prior ultrasound  exam.  A BPP performed today was 8/8.  The total AFI was 11.4 cm.  Vertex presentation.  Doppler studies of the umbilical arteries continues to show an  elevated S/D ratio of 4.9.  An occasional episode of absent  end-diastolic flow was noted.  There were no signs of  reversed end-diastolic flow noted today.  She should continue the plan for inpatient management,  twice weekly umbilical artery Doppler studies and BPP's, and  twice daily NSTs.  Delivery is planned for 34 weeks. ----------------------------------------------------------------------                   Steffan Keys, MD Electronically Signed Final Report   04/02/2024 09:54 am ----------------------------------------------------------------------     Medications:  Scheduled  aspirin   162 mg Oral Daily   bisacodyl  5 mg Oral Once   escitalopram  20 mg Oral Daily   famotidine  20 mg Oral BID   insulin  aspart  0-15 Units Subcutaneous TID PC   insulin  aspart  4 Units Subcutaneous TID WC   insulin  glargine-yfgn  10 Units Subcutaneous Daily   labetalol   800 mg Oral TID   metFORMIN   1,000 mg Oral BID WC   NIFEdipine   60 mg Oral BID   prenatal multivitamin  1 tablet Oral Q1200   sodium chloride  flush  3 mL Intravenous Q12H   valACYclovir  500 mg Oral BID   I have reviewed the patient's current medications.  ASSESSMENT: G2P0010 [redacted]w[redacted]d with CHTN superimposed preeclampsia Patient Active Problem List   Diagnosis Date Noted   IUGR (intrauterine growth restriction) affecting care of mother 03/25/2024   Chronic hypertension with superimposed preeclampsia 03/24/2024   Severe pre-eclampsia 03/24/2024   Preexisting diabetes complicating pregnancy, antepartum 01/01/2024   Preexisting hypertension complicating pregnancy, antepartum 01/01/2024   Supervision of high risk pregnancy, antepartum 12/18/2023   Maternal morbid obesity, antepartum (HCC) 12/18/2023   LGSIL on Pap smear of cervix  06/21/2022   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 If we have severe range pressures, will move towards delivery -pt asymptomatic -labs q 72hr, remain stable 10/31  2) Fetal well being, FGR -NST nonreactive this AM, BPP ordered. Continue BID NSTs -growth 10/23- vertex/EFW 1323g (8%) with intermittent AEDF -continue dopplers/BPP twice weekly- last 10/30 with stable iAEDF, BPP 8/8 - s/p BMZ 10/22-23  3) T2DM - Given postprandial low last night, will drop novolog  to 4u TIDmeals - Continue metformin  1g BID, semglee  10 units daily and SSI for coverage  4) Maternal care -PNV daily -routine OB care -valtrex twice daily for suppression  Continue inpatient management with plans for delivery at 34wks or sooner as clinically indicated.    Continue close monitoring  Kieth JAYSON Carolin, MD 04/04/2024,3:16 PM

## 2024-04-04 NOTE — Plan of Care (Signed)
  Problem: Education: Goal: Knowledge of disease or condition will improve Outcome: Progressing Goal: Knowledge of the prescribed therapeutic regimen will improve Outcome: Progressing   Problem: Fluid Volume: Goal: Peripheral tissue perfusion will improve Outcome: Progressing   Problem: Clinical Measurements: Goal: Complications related to disease process, condition or treatment will be avoided or minimized Outcome: Progressing   Problem: Health Behavior/Discharge Planning: Goal: Ability to manage health-related needs will improve Outcome: Progressing   Problem: Clinical Measurements: Goal: Ability to maintain clinical measurements within normal limits will improve Outcome: Progressing Goal: Will remain free from infection Outcome: Progressing Goal: Diagnostic test results will improve Outcome: Progressing Goal: Respiratory complications will improve Outcome: Progressing Goal: Cardiovascular complication will be avoided Outcome: Progressing   Problem: Activity: Goal: Risk for activity intolerance will decrease Outcome: Progressing   Problem: Nutrition: Goal: Adequate nutrition will be maintained Outcome: Progressing   Problem: Coping: Goal: Level of anxiety will decrease Outcome: Progressing   Problem: Elimination: Goal: Will not experience complications related to bowel motility Outcome: Progressing Goal: Will not experience complications related to urinary retention Outcome: Progressing   Problem: Pain Managment: Goal: General experience of comfort will improve and/or be controlled Outcome: Progressing   Problem: Safety: Goal: Ability to remain free from injury will improve Outcome: Progressing   Problem: Skin Integrity: Goal: Risk for impaired skin integrity will decrease Outcome: Progressing   Problem: Education: Goal: Knowledge of disease or condition will improve Outcome: Progressing Goal: Knowledge of the prescribed therapeutic regimen will  improve Outcome: Progressing Goal: Individualized Educational Video(s) Outcome: Progressing   Problem: Clinical Measurements: Goal: Complications related to the disease process, condition or treatment will be avoided or minimized Outcome: Progressing   Problem: Education: Goal: Ability to describe self-care measures that may prevent or decrease complications (Diabetes Survival Skills Education) will improve Outcome: Progressing Goal: Individualized Educational Video(s) Outcome: Progressing   Problem: Coping: Goal: Ability to adjust to condition or change in health will improve Outcome: Progressing   Problem: Fluid Volume: Goal: Ability to maintain a balanced intake and output will improve Outcome: Progressing   Problem: Health Behavior/Discharge Planning: Goal: Ability to identify and utilize available resources and services will improve Outcome: Progressing Goal: Ability to manage health-related needs will improve Outcome: Progressing   Problem: Metabolic: Goal: Ability to maintain appropriate glucose levels will improve Outcome: Progressing   Problem: Nutritional: Goal: Maintenance of adequate nutrition will improve Outcome: Progressing Goal: Progress toward achieving an optimal weight will improve Outcome: Progressing   Problem: Skin Integrity: Goal: Risk for impaired skin integrity will decrease Outcome: Progressing   Problem: Tissue Perfusion: Goal: Adequacy of tissue perfusion will improve Outcome: Progressing

## 2024-04-05 ENCOUNTER — Inpatient Hospital Stay (HOSPITAL_COMMUNITY)

## 2024-04-05 DIAGNOSIS — O1413 Severe pre-eclampsia, third trimester: Secondary | ICD-10-CM | POA: Diagnosis not present

## 2024-04-05 DIAGNOSIS — O10013 Pre-existing essential hypertension complicating pregnancy, third trimester: Secondary | ICD-10-CM

## 2024-04-05 DIAGNOSIS — E119 Type 2 diabetes mellitus without complications: Secondary | ICD-10-CM

## 2024-04-05 DIAGNOSIS — Z3A31 31 weeks gestation of pregnancy: Secondary | ICD-10-CM

## 2024-04-05 DIAGNOSIS — O365930 Maternal care for other known or suspected poor fetal growth, third trimester, not applicable or unspecified: Secondary | ICD-10-CM

## 2024-04-05 DIAGNOSIS — O24113 Pre-existing diabetes mellitus, type 2, in pregnancy, third trimester: Secondary | ICD-10-CM

## 2024-04-05 LAB — COMPREHENSIVE METABOLIC PANEL WITH GFR
ALT: 25 U/L (ref 0–44)
AST: 18 U/L (ref 15–41)
Albumin: 2.1 g/dL — ABNORMAL LOW (ref 3.5–5.0)
Alkaline Phosphatase: 56 U/L (ref 38–126)
Anion gap: 9 (ref 5–15)
BUN: 21 mg/dL — ABNORMAL HIGH (ref 6–20)
CO2: 22 mmol/L (ref 22–32)
Calcium: 8.8 mg/dL — ABNORMAL LOW (ref 8.9–10.3)
Chloride: 105 mmol/L (ref 98–111)
Creatinine, Ser: 0.54 mg/dL (ref 0.44–1.00)
GFR, Estimated: >60 mL/min (ref >60)
Glucose, Bld: 99 mg/dL (ref 70–99)
Potassium: 4.2 mmol/L (ref 3.5–5.1)
Sodium: 136 mmol/L (ref 135–145)
Total Bilirubin: 0.3 mg/dL (ref 0.0–1.2)
Total Protein: 5.3 g/dL — ABNORMAL LOW (ref 6.5–8.1)

## 2024-04-05 LAB — CBC
HCT: 34.2 % — ABNORMAL LOW (ref 36.0–46.0)
Hemoglobin: 11.3 g/dL — ABNORMAL LOW (ref 12.0–15.0)
MCH: 29.1 pg (ref 26.0–34.0)
MCHC: 33 g/dL (ref 30.0–36.0)
MCV: 88.1 fL (ref 80.0–100.0)
Platelets: 241 K/uL (ref 150–400)
RBC: 3.88 MIL/uL (ref 3.87–5.11)
RDW: 13.2 % (ref 11.5–15.5)
WBC: 16.1 K/uL — ABNORMAL HIGH (ref 4.0–10.5)
nRBC: 0 % (ref 0.0–0.2)

## 2024-04-05 LAB — TYPE AND SCREEN
ABO/RH(D): A POS
Antibody Screen: NEGATIVE

## 2024-04-05 LAB — GLUCOSE, CAPILLARY
Glucose-Capillary: 84 mg/dL (ref 70–99)
Glucose-Capillary: 114 mg/dL — ABNORMAL HIGH (ref 70–99)
Glucose-Capillary: 142 mg/dL — ABNORMAL HIGH (ref 70–99)
Glucose-Capillary: 140 mg/dL — ABNORMAL HIGH (ref 70–99)

## 2024-04-05 NOTE — Plan of Care (Signed)
  Problem: Education: Goal: Knowledge of disease or condition will improve Outcome: Progressing   Problem: Fluid Volume: Goal: Peripheral tissue perfusion will improve Outcome: Progressing   Problem: Clinical Measurements: Goal: Complications related to disease process, condition or treatment will be avoided or minimized Outcome: Progressing   Problem: Health Behavior/Discharge Planning: Goal: Ability to manage health-related needs will improve Outcome: Progressing   Problem: Health Behavior/Discharge Planning: Goal: Ability to manage health-related needs will improve Outcome: Progressing   Problem: Clinical Measurements: Goal: Diagnostic test results will improve Outcome: Progressing Goal: Respiratory complications will improve Outcome: Progressing Goal: Cardiovascular complication will be avoided Outcome: Progressing

## 2024-04-05 NOTE — Progress Notes (Signed)
 Patient ID: Summer Campbell, female   DOB: May 30, 1991, 33 y.o.   MRN: [id number] FACULTY PRACTICE ANTEPARTUM(COMPREHENSIVE) NOTE  Summer Campbell is a 33 y.o. G2P0010 with Estimated Date of Delivery: 06/01/24   By  early ultrasound [redacted]w[redacted]d  who is admitted for cHTN w/SIPE, severe features.    Fetal presentation is cephalic. Length of Stay:  12  Days  Date of admission:03/24/2024  Subjective: No complaints Patient reports the fetal movement as active. Patient reports uterine contraction  activity as none. Patient reports  vaginal bleeding as none. Patient describes fluid per vagina as None.  Vitals:  Blood pressure 136/65, pulse 79, temperature 98.1 F (36.7 C), temperature source Oral, resp. rate 19, height 5' 4 (1.626 m), weight (!) 152.5 kg, SpO2 98%. Vitals:   04/04/24 2342 04/05/24 0457 04/05/24 0733 04/05/24 1111  BP: (!) 154/71 (!) 154/80 (!) 131/59 136/65  Pulse: 84 81 80 79  Resp: 18 18 19 19   Temp: 98 F (36.7 C)  97.6 F (36.4 C) 98.1 F (36.7 C)  TempSrc: Oral  Oral Oral  SpO2:  97% 98% 98%  Weight:      Height:       Physical Examination:  General appearance - alert, well appearing, and in no distress Abdomen - soft, nontender, nondistended, no masses or organomegaly Fundal Height:  size less than dates Pelvic Exam:  examination not indicated Cervical Exam: Not evaluated. Extremities: extremities normal, atraumatic, no cyanosis or edema with DTRs 2+ bilaterally Membranes:intact  Fetal Monitoring:  Baseline: 130s bpm, Variability: Good {> 6 bpm), Accelerations: Reactive, and Decelerations: Absent   reactive  Labs:  Results for orders placed or performed during the hospital encounter of 03/24/24 (from the past 24 hours)  Glucose, capillary   Collection Time: 04/04/24  8:32 PM  Result Value Ref Range   Glucose-Capillary 120 (H) 70 - 99 mg/dL  Glucose, capillary   Collection Time: 04/04/24 10:05 PM  Result Value Ref Range   Glucose-Capillary 107 (H) 70 - 99  mg/dL  Comprehensive metabolic panel   Collection Time: 04/05/24 12:19 AM  Result Value Ref Range   Sodium 136 135 - 145 mmol/L   Potassium 4.2 3.5 - 5.1 mmol/L   Chloride 105 98 - 111 mmol/L   CO2 22 22 - 32 mmol/L   Glucose, Bld 99 70 - 99 mg/dL   BUN 21 (H) 6 - 20 mg/dL   Creatinine, Ser 9.45 0.44 - 1.00 mg/dL   Calcium 8.8 (L) 8.9 - 10.3 mg/dL   Total Protein 5.3 (L) 6.5 - 8.1 g/dL   Albumin 2.1 (L) 3.5 - 5.0 g/dL   AST 18 15 - 41 U/L   ALT 25 0 - 44 U/L   Alkaline Phosphatase 56 38 - 126 U/L   Total Bilirubin 0.3 0.0 - 1.2 mg/dL   GFR, Estimated >39 >39 mL/min   Anion gap 9 5 - 15  Type and screen [facility]   Collection Time: 04/05/24  4:20 AM  Result Value Ref Range   ABO/RH(D) A POS    Antibody Screen NEG    Sample Expiration      04/08/2024,2359 Performed at [facility], [address] [place], [place] [address]   CBC   Collection Time: 04/05/24  4:22 AM  Result Value Ref Range   WBC 16.1 (H) 4.0 - 10.5 K/uL   RBC 3.88 3.87 - 5.11 MIL/uL   Hemoglobin 11.3 (L) 12.0 - 15.0 g/dL   HCT  34.2 (L) 36.0 - 46.0 %   MCV 88.1 80.0 - 100.0 fL   MCH 29.1 26.0 - 34.0 pg   MCHC 33.0 30.0 - 36.0 g/dL   RDW 86.7 88.4 - 84.4 %   Platelets 241 150 - 400 K/uL   nRBC 0.0 0.0 - 0.2 %  Glucose, capillary   Collection Time: 04/05/24  4:58 AM  Result Value Ref Range   Glucose-Capillary 84 70 - 99 mg/dL  Glucose, capillary   Collection Time: 04/05/24 11:21 AM  Result Value Ref Range   Glucose-Capillary 114 (H) 70 - 99 mg/dL    Imaging Studies:    US  MFM FETAL BPP WO NON STRESS Result Date: 04/04/2024 ----------------------------------------------------------------------  OBSTETRICS REPORT                       (Signed Final 04/04/2024 08:01 pm) ---------------------------------------------------------------------- Patient Info  ID #:       [id number]                          D.O.B.:  11-Mar-1991 (32 yrs)(F)  Name:       Summer Campbell                 Visit  Date: 04/04/2024 03:20 pm ---------------------------------------------------------------------- Performed By  Attending:        Steffan Keys MD         Ref. Address:      [address]                                                              [place], [place]                                                              [address]  Performed By:     Metta Chang RDMS         Secondary Phy.:    [facility]                                                              Care  Referred By:      BEBE                [facility]                    PICKENS MD                                [facility] ---------------------------------------------------------------------- Orders  #  Description                           Code  Ordered By  1  US  MFM FETAL BPP WO NON               76819.01    [facility]     STRESS ----------------------------------------------------------------------  #  Order #                     Accession #                Episode #  1  [id number]                   [id number]                 [id number] ---------------------------------------------------------------------- Indications  Non-reactive NST                                O28.9  Maternal care for known or suspected poor       O36.5930  fetal growth, third trimester, single or  unspecified fetus IUGR  Severe preeclampsia, third trimester            O14.13  Pre-existing diabetes, type 2, in pregnancy,    O24.113  third trimester (insulin )  [redacted] weeks gestation of pregnancy                 Z3A.31  Pre-existing essential hypertension             O10.013  complicating pregnancy, third trimester  (procardia  and labetalol )  Obesity complicating pregnancy, third           O99.213  trimester (BMI 51.7)  Anxiety during pregnancy, third trimester       O99.343, F41.9 ---------------------------------------------------------------------- Fetal Evaluation  Num Of Fetuses:          1  Fetal Heart Rate(bpm):   137  Cardiac Activity:         Observed  Presentation:            Cephalic  Placenta:                Posterior  Amniotic Fluid  AFI FV:      Within normal limits  AFI Sum(cm)     %Tile       Largest Pocket(cm)  10.6            20          4.7  RUQ(cm)       RLQ(cm)       LUQ(cm)        LLQ(cm)  4.1           0             1.8            4.7 ---------------------------------------------------------------------- Biophysical Evaluation  Amniotic F.V:   Pocket => 2 cm             F. Tone:         Observed  F. Movement:    Observed                   Score:           8/8  F. Breathing:   Observed ---------------------------------------------------------------------- OB History  Blood Type:   A+  Gravidity:    2          SAB:   1  Living:       0 ---------------------------------------------------------------------- Gestational  Age  Best:          31w 5d     Det. By:  Early Ultrasound         EDD:   06/01/24                                      (10/26/23) ---------------------------------------------------------------------- Comments  This patient has been hospitalized due to chronic  hypertension with superimposed preeclampsia along with  IUGR with intermittent absent end-diastolic flow.  A biophysical profile performed today was 8/8.  The AFI was 10.6 cm.  Vertex presentation. ----------------------------------------------------------------------                   Steffan Keys, MD Electronically Signed Final Report   04/04/2024 08:01 pm ----------------------------------------------------------------------   US  MFM UA CORD DOPPLER Result Date: 04/02/2024 ----------------------------------------------------------------------  OBSTETRICS REPORT                       (Signed Final 04/02/2024 09:54 am) ---------------------------------------------------------------------- Patient Info  ID #:       [id number]                          D.O.B.:  11-30-1990 (32 yrs)(F)  Name:       Summer Campbell                 Visit Date: 04/01/2024 03:40 pm  ---------------------------------------------------------------------- Performed By  Attending:        Steffan Keys MD         Ref. Address:     [address]                                                             [place], [place]                                                             [address]  Performed By:     Burnard CROME Custard          Secondary Phy.:   [facility]                    RDMS                                                             Care  Referred By:      BEBE                [facility] ---------------------------------------------------------------------- Orders  #  Description                           Code        Ordered By  1  US  MFM UA CORD DOPPLER                76820.02    UGONNA ANYANWU  2  US  MFM FETAL BPP WO NON               76819.01    UGONNA ANYANWU     STRESS ----------------------------------------------------------------------  #  Order #                     Accession #                Episode #  1  [id number]                   [id number]                 [id number]  2  [id number]                   [id number]                 [id number] ---------------------------------------------------------------------- Indications  Severe preeclampsia, third trimester           O14.13  Pre-existing diabetes, type 2, in pregnancy,   O24.113  third trimester (insulin )  Maternal care for known or suspected poor      O36.5930  fetal growth, third trimester, single or  unspecified fetus IUGR  Pre-existing essential hypertension            O10.013  complicating pregnancy, third trimester  (procardia  and labetalol )  Obesity complicating pregnancy, third          O99.213  trimester (BMI 51.7)  [redacted] weeks gestation of pregnancy                Z3A.31  Anxiety during pregnancy, third trimester      O99.343, F41.9 ---------------------------------------------------------------------- Fetal Evaluation  Num Of  Fetuses:         1  Fetal Heart Rate(bpm):  146  Cardiac Activity:       Observed  Presentation:           Cephalic  Placenta:               Posterior  Amniotic Fluid  AFI FV:      Within normal limits  AFI Sum(cm)     %Tile       Largest Pocket(cm)  10.4            18          3.8  RUQ(cm)       RLQ(cm)       LUQ(cm)        LLQ(cm)  3.8           1.7           1.5            3.4 ---------------------------------------------------------------------- Biophysical Evaluation  Amniotic F.V:   Within normal limits       F. Tone:        Observed  F. Movement:    Observed                   Score:  8/8  F. Breathing:   Observed ---------------------------------------------------------------------- OB History  Blood Type:   A+  Gravidity:    2          SAB:   1  Living:       0 ---------------------------------------------------------------------- Gestational Age  Best:          31w 2d     Det. By:  Early Ultrasound         EDD:   06/01/24                                      (10/26/23) ---------------------------------------------------------------------- Anatomy  Stomach:               Appears normal, left   Bladder:                Appears normal                         sided ---------------------------------------------------------------------- Doppler - Fetal Vessels  Umbilical Artery   S/D     %tile      RI    %tile      PI    %tile     PSV    ADFV    RDFV                                                     (cm/s)    4.9   > 97.5     0.8   > 97.5     1.5   > 97.5     38.5     Yes      No ---------------------------------------------------------------------- Comments  This patient has been hospitalized due to preeclampsia with  severe features.  IUGR was noted on her prior ultrasound  exam.  A BPP performed today was 8/8.  The total AFI was 11.4 cm.  Vertex presentation.  Doppler studies of the umbilical arteries continues to show an  elevated S/D ratio of 4.9.  An occasional episode of absent  end-diastolic flow  was noted.  There were no signs of  reversed end-diastolic flow noted today.  She should continue the plan for inpatient management,  twice weekly umbilical artery Doppler studies and BPP's, and  twice daily NSTs.  Delivery is planned for 34 weeks. ----------------------------------------------------------------------                   Steffan Keys, MD Electronically Signed Final Report   04/02/2024 09:54 am ----------------------------------------------------------------------   US  MFM FETAL BPP WO NON STRESS Result Date: 04/02/2024 ----------------------------------------------------------------------  OBSTETRICS REPORT                       (Signed Final 04/02/2024 09:54 am) ---------------------------------------------------------------------- Patient Info  ID #:       [id number]                          D.O.B.:  10-Aug-1990 (32 yrs)(F)  Name:       Summer Campbell                 Visit Date: 04/01/2024 03:40 pm ---------------------------------------------------------------------- Performed By  Attending:  Steffan Keys MD         Ref. Address:     [address]                                                             [place], [place]                                                             [address]  Performed By:     Burnard LITTIE Custard          Secondary Phy.:   [facility]                    RDMS                                                             Care  Referred By:      BEBE                [facility] ---------------------------------------------------------------------- Orders  #  Description                           Code        Ordered By  1  US  MFM UA CORD DOPPLER                76820.02    UGONNA ANYANWU  2  US  MFM FETAL BPP WO NON               76819.01    UGONNA ANYANWU     STRESS ----------------------------------------------------------------------  #  Order #                     Accession #                 Episode #  1  [id number]                   [id number]                 [id number]  2  [id number]                   [id number]                 [id number] ---------------------------------------------------------------------- Indications  Severe preeclampsia, third trimester           O14.13  Pre-existing diabetes, type 2, in pregnancy,   O24.113  third trimester (  insulin )  Maternal care for known or suspected poor      O36.5930  fetal growth, third trimester, single or  unspecified fetus IUGR  Pre-existing essential hypertension            O10.013  complicating pregnancy, third trimester  (procardia  and labetalol )  Obesity complicating pregnancy, third          O99.213  trimester (BMI 51.7)  [redacted] weeks gestation of pregnancy                Z3A.31  Anxiety during pregnancy, third trimester      O99.343, F41.9 ---------------------------------------------------------------------- Fetal Evaluation  Num Of Fetuses:         1  Fetal Heart Rate(bpm):  146  Cardiac Activity:       Observed  Presentation:           Cephalic  Placenta:               Posterior  Amniotic Fluid  AFI FV:      Within normal limits  AFI Sum(cm)     %Tile       Largest Pocket(cm)  10.4            18          3.8  RUQ(cm)       RLQ(cm)       LUQ(cm)        LLQ(cm)  3.8           1.7           1.5            3.4 ---------------------------------------------------------------------- Biophysical Evaluation  Amniotic F.V:   Within normal limits       F. Tone:        Observed  F. Movement:    Observed                   Score:          8/8  F. Breathing:   Observed ---------------------------------------------------------------------- OB History  Blood Type:   A+  Gravidity:    2          SAB:   1  Living:       0 ---------------------------------------------------------------------- Gestational Age  Best:          31w 2d     Det. By:  Early Ultrasound         EDD:   06/01/24                                      (10/26/23)  ---------------------------------------------------------------------- Anatomy  Stomach:               Appears normal, left   Bladder:                Appears normal                         sided ---------------------------------------------------------------------- Doppler - Fetal Vessels  Umbilical Artery   S/D     %tile      RI    %tile      PI    %tile     PSV    ADFV    RDFV                                                     (  cm/s)    4.9   > 97.5     0.8   > 97.5     1.5   > 97.5     38.5     Yes      No ---------------------------------------------------------------------- Comments  This patient has been hospitalized due to preeclampsia with  severe features.  IUGR was noted on her prior ultrasound  exam.  A BPP performed today was 8/8.  The total AFI was 11.4 cm.  Vertex presentation.  Doppler studies of the umbilical arteries continues to show an  elevated S/D ratio of 4.9.  An occasional episode of absent  end-diastolic flow was noted.  There were no signs of  reversed end-diastolic flow noted today.  She should continue the plan for inpatient management,  twice weekly umbilical artery Doppler studies and BPP's, and  twice daily NSTs.  Delivery is planned for 34 weeks. ----------------------------------------------------------------------                   Steffan Keys, MD Electronically Signed Final Report   04/02/2024 09:54 am ----------------------------------------------------------------------     Medications:  Scheduled  aspirin   162 mg Oral Daily   bisacodyl  5 mg Oral Once   escitalopram  20 mg Oral Daily   famotidine  20 mg Oral BID   insulin  aspart  0-15 Units Subcutaneous TID PC   insulin  aspart  4 Units Subcutaneous TID WC   insulin  glargine-yfgn  10 Units Subcutaneous Daily   labetalol   800 mg Oral TID   metFORMIN   1,000 mg Oral BID WC   NIFEdipine   60 mg Oral BID   prenatal multivitamin  1 tablet Oral Q1200   sodium chloride  flush  3 mL Intravenous Q12H   valACYclovir  500 mg  Oral BID   I have reviewed the patient's current medications.  ASSESSMENT: G2P0010 [redacted]w[redacted]d Estimated Date of Delivery: 06/01/24  cHTN with SIPE, severe features, BP FGR with AEDF Class B (Type 2 DM)   PLAN: >UAD today AEDF no reverse, continue current surveillance schedule >BP stable at the upper mild range, continue both meds, at current doses >CBG decent s/p steroids, cont current care plan, MTF 1 gram twice daily and semglee  10u  Summer Campbell 04/05/2024,12:49 PM

## 2024-04-06 ENCOUNTER — Encounter: Admitting: Obstetrics and Gynecology

## 2024-04-06 ENCOUNTER — Telehealth (HOSPITAL_COMMUNITY): Payer: Self-pay | Admitting: Cardiology

## 2024-04-06 DIAGNOSIS — O113 Pre-existing hypertension with pre-eclampsia, third trimester: Secondary | ICD-10-CM | POA: Diagnosis not present

## 2024-04-06 DIAGNOSIS — Z3A32 32 weeks gestation of pregnancy: Secondary | ICD-10-CM | POA: Diagnosis not present

## 2024-04-06 LAB — GLUCOSE, CAPILLARY
Glucose-Capillary: 104 mg/dL — ABNORMAL HIGH (ref 70–99)
Glucose-Capillary: 104 mg/dL — ABNORMAL HIGH (ref 70–99)
Glucose-Capillary: 65 mg/dL — ABNORMAL LOW (ref 70–99)
Glucose-Capillary: 75 mg/dL (ref 70–99)

## 2024-04-06 MED ORDER — RSV PRE-FUSION F A&B VAC RCMB 120 MCG/0.5ML IM SOLR
0.5000 mL | Freq: Once | INTRAMUSCULAR | Status: AC
  Administered 2024-04-06: 0.5 mL via INTRAMUSCULAR
  Filled 2024-04-06: qty 0.5

## 2024-04-06 MED ORDER — POLYETHYLENE GLYCOL 3350 17 G PO PACK: 17.0000 g | PACK | Freq: Every day | ORAL | Status: DC | PRN

## 2024-04-06 NOTE — Plan of Care (Signed)
  Problem: Education: Goal: Knowledge of disease or condition will improve Outcome: Progressing Goal: Knowledge of the prescribed therapeutic regimen will improve Outcome: Progressing   Problem: Fluid Volume: Goal: Peripheral tissue perfusion will improve Outcome: Progressing   Problem: Clinical Measurements: Goal: Complications related to disease process, condition or treatment will be avoided or minimized Outcome: Progressing   Problem: Health Behavior/Discharge Planning: Goal: Ability to manage health-related needs will improve Outcome: Progressing   Problem: Clinical Measurements: Goal: Ability to maintain clinical measurements within normal limits will improve Outcome: Progressing Goal: Will remain free from infection Outcome: Progressing Goal: Diagnostic test results will improve Outcome: Progressing Goal: Respiratory complications will improve Outcome: Progressing Goal: Cardiovascular complication will be avoided Outcome: Progressing   Problem: Activity: Goal: Risk for activity intolerance will decrease Outcome: Progressing   Problem: Nutrition: Goal: Adequate nutrition will be maintained Outcome: Progressing   Problem: Coping: Goal: Level of anxiety will decrease Outcome: Progressing   Problem: Elimination: Goal: Will not experience complications related to bowel motility Outcome: Progressing Goal: Will not experience complications related to urinary retention Outcome: Progressing   Problem: Pain Managment: Goal: General experience of comfort will improve and/or be controlled Outcome: Progressing   Problem: Safety: Goal: Ability to remain free from injury will improve Outcome: Progressing   Problem: Skin Integrity: Goal: Risk for impaired skin integrity will decrease Outcome: Progressing   Problem: Education: Goal: Knowledge of disease or condition will improve Outcome: Progressing Goal: Knowledge of the prescribed therapeutic regimen will  improve Outcome: Progressing Goal: Individualized Educational Video(s) Outcome: Progressing   Problem: Clinical Measurements: Goal: Complications related to the disease process, condition or treatment will be avoided or minimized Outcome: Progressing   Problem: Education: Goal: Ability to describe self-care measures that may prevent or decrease complications (Diabetes Survival Skills Education) will improve Outcome: Progressing Goal: Individualized Educational Video(s) Outcome: Progressing   Problem: Coping: Goal: Ability to adjust to condition or change in health will improve Outcome: Progressing   Problem: Fluid Volume: Goal: Ability to maintain a balanced intake and output will improve Outcome: Progressing   Problem: Health Behavior/Discharge Planning: Goal: Ability to identify and utilize available resources and services will improve Outcome: Progressing Goal: Ability to manage health-related needs will improve Outcome: Progressing   Problem: Metabolic: Goal: Ability to maintain appropriate glucose levels will improve Outcome: Progressing   Problem: Nutritional: Goal: Maintenance of adequate nutrition will improve Outcome: Progressing Goal: Progress toward achieving an optimal weight will improve Outcome: Progressing   Problem: Skin Integrity: Goal: Risk for impaired skin integrity will decrease Outcome: Progressing   Problem: Tissue Perfusion: Goal: Adequacy of tissue perfusion will improve Outcome: Progressing

## 2024-04-06 NOTE — Progress Notes (Signed)
 Hypoglycemic Event  CBG: 65  Treatment: 4 oz juice/soda  Symptoms: None  Follow-up CBG: Time:0118 CBG Result:75  Possible Reasons for Event: Adjustment to medication coverage    Augusto Deckman E Herbert Aguinaldo

## 2024-04-06 NOTE — Telephone Encounter (Signed)
 Patient called and cancelled echocardiogram and did not reschedule:  04/06/2024 8:44 AM Ab:[id number], STEPHANIE N  Cancel Rsn: Patient   Order will be removed from the echo WQ. Thank you

## 2024-04-06 NOTE — Progress Notes (Signed)
 Patient ID: Summer Campbell, female   DOB: 04-21-1991, 33 y.o.   MRN: [id number] FACULTY PRACTICE ANTEPARTUM(COMPREHENSIVE) NOTE  Summer Campbell is a 33 y.o. G2P0010 at [redacted]w[redacted]d  who is admitted for chronic HTN with superimposed severe preE.  Pregnancy is also c/b FGR w/ iAEDF  Fetal presentation is cephalic. Length of Stay:  13  Days  Date of admission:03/24/2024  Subjective: Patient reports feeling well with a restless night due to fetal movement. She is without any other complaints. She denies HA, visual changes, CP/SOB, RUQ/epigastric pain. Denies ctx, VB, LOF. +FM  Vitals:  Blood pressure (!) 119/57, pulse 75, temperature 97.9 F (36.6 C), temperature source Oral, resp. rate 20, height 5' 4 (1.626 m), weight (!) 152.5 kg, SpO2 97%. Vitals:   04/05/24 1544 04/05/24 1959 04/05/24 2354 04/06/24 0444  BP: (!) 157/78 (!) 156/85 (!) 128/52 (!) 119/57  Pulse: 84 83 83 75  Resp: 18 18 19 20   Temp: 98 F (36.7 C) 98.3 F (36.8 C) 98.6 F (37 C) 97.9 F (36.6 C)  TempSrc: Oral Oral Oral Oral  SpO2: 98%  100% 97%  Weight:      Height:       Physical Examination: GENERAL: Well-developed, well-nourished female in no acute distress.  LUNGS: Normal effort HEART: Regular rate  ABDOMEN: Soft, nontender, Gravid  Fetal Monitoring:  Baseline: 135 bpm, Variability: moderate, Accelerations: absent, and Decelerations: none  Labs:  Results for orders placed or performed during the hospital encounter of 03/24/24 (from the past 24 hours)  Glucose, capillary   Collection Time: 04/05/24 11:21 AM  Result Value Ref Range   Glucose-Capillary 114 (H) 70 - 99 mg/dL  Glucose, capillary   Collection Time: 04/05/24  5:44 PM  Result Value Ref Range   Glucose-Capillary 142 (H) 70 - 99 mg/dL  Glucose, capillary   Collection Time: 04/05/24  9:35 PM  Result Value Ref Range   Glucose-Capillary 140 (H) 70 - 99 mg/dL  Glucose, capillary   Collection Time: 04/06/24 12:58 AM  Result Value Ref Range    Glucose-Capillary 65 (L) 70 - 99 mg/dL  Glucose, capillary   Collection Time: 04/06/24  1:18 AM  Result Value Ref Range   Glucose-Capillary 75 70 - 99 mg/dL      Latest Ref Rng & Units 04/05/2024    4:22 AM 04/02/2024    5:51 AM 03/30/2024    4:13 AM  CBC  WBC 4.0 - 10.5 K/uL 16.1  14.5  14.7   Hemoglobin 12.0 - 15.0 g/dL 88.6  88.4  88.6   Hematocrit 36.0 - 46.0 % 34.2  34.6  34.9   Platelets 150 - 400 K/uL 241  240  248       Latest Ref Rng & Units 04/05/2024   12:19 AM 04/02/2024    5:51 AM 03/30/2024    4:13 AM  CMP  Glucose 70 - 99 mg/dL 99  85  92   BUN 6 - 20 mg/dL 21  19  17    Creatinine 0.44 - 1.00 mg/dL 9.45  9.19  9.42   Sodium 135 - 145 mmol/L 136  136  137   Potassium 3.5 - 5.1 mmol/L 4.2  5.0  4.2   Chloride 98 - 111 mmol/L 105  104  104   CO2 22 - 32 mmol/L 22  22  23    Calcium 8.9 - 10.3 mg/dL 8.8  8.7  8.8   Total Protein 6.5 - 8.1 g/dL 5.3  5.3  5.3   Total Bilirubin 0.0 - 1.2 mg/dL 0.3  0.2  0.3   Alkaline Phos 38 - 126 U/L 56  55  51   AST 15 - 41 U/L 18  17  18    ALT 0 - 44 U/L 25  29  30       Imaging Studies:    US  MFM UA CORD DOPPLER Result Date: 04/05/2024 ----------------------------------------------------------------------  OBSTETRICS REPORT                       (Signed Final 04/05/2024 05:08 pm) ---------------------------------------------------------------------- Patient Info  ID #:       [id number]                          D.O.B.:  1991/01/30 (33 yrs)(F)  Name:       Summer Campbell                 Visit Date: 04/05/2024 07:16 am ---------------------------------------------------------------------- Performed By  Attending:        Steffan Keys MD         Ref. Address:     [address]                                                             [place], [place]                                                             [address]  Performed By:     Powell Breen BS       Secondary Phy.:   [facility]                    RDMS                                                              Care  Referred By:      BEBE                [facility]ICK[facility] MD                               [facility] ---------------------------------------------------------------------- Orders  #  Description                           Code        Ordered By  1  US  MFM FETAL BPP WO NON               76819.01    CHARLIE PICKENS     STRESS  2  US  MFM UA CORD DOPPLER                76820.02    CHARLIE PICKENS ----------------------------------------------------------------------  #  Order #                     Accession #                Episode #  1  [id number]                   [id number]                 [id number]  2  [id number]                   [id number]                 [id number] ---------------------------------------------------------------------- Indications  Maternal care for known or suspected poor      O36.5930  fetal growth, third trimester, single or  unspecified fetus IUGR  Severe preeclampsia, third trimester           O14.13  Pre-existing diabetes, type 2, in pregnancy,   O24.113  third trimester (insulin )  Pre-existing essential hypertension            O10.013  complicating pregnancy, third trimester  (procardia  and labetalol )  Obesity complicating pregnancy, third          O99.213  trimester (BMI 51.7)  Anxiety during pregnancy, third trimester      O99.343, F41.9  [redacted] weeks gestation of pregnancy                Z3A.31 ---------------------------------------------------------------------- Fetal Evaluation  Num Of Fetuses:         1  Fetal Heart Rate(bpm):  140  Cardiac Activity:       Observed  Presentation:           Cephalic  Placenta:               Posterior  P. Cord Insertion:      Previously seen  Amniotic Fluid  AFI FV:      Within normal limits  AFI Sum(cm)     %Tile       Largest Pocket(cm)  12.9            38          4.8  RUQ(cm)       RLQ(cm)       LUQ(cm)        LLQ(cm)  3.8           0             4.8            4.3  ---------------------------------------------------------------------- Biophysical Evaluation  Amniotic F.V:   Pocket => 2 cm             F. Tone:        Observed  F. Movement:    Observed                   Score:          8/8  F. Breathing:   Observed ---------------------------------------------------------------------- OB History  Blood Type:   A+  Gravidity:    2          SAB:   1  Living:       0 ---------------------------------------------------------------------- Gestational Age  Best:  31w 6d     Det. By:  Early Ultrasound         EDD:   06/01/24                                      (10/26/23) ---------------------------------------------------------------------- Anatomy  Cranium:               Previously seen        Aortic Arch:            Not well visualized  Cavum:                 Previously seen        Ductal Arch:            Previously seen  Ventricles:            Previously seen        Diaphragm:              Appears normal  Choroid Plexus:        Previously seen        Stomach:                Appears normal, left                                                                        sided  Cerebellum:            Not well visualized    Abdomen:                Previously seen  Posterior Fossa:       Not well visualized    Abdominal Wall:         Not well visualized  Face:                  Not well visualized    Cord Vessels:           Not well visualized  Lips:                  Not well visualized    Kidneys:                Appear normal  Thoracic:              Previously seen        Bladder:                Appears normal  Heart:                 Not well visualized    Spine:                  Previously seen  RVOT:                  Not well visualized    Upper Extremities:      Previously seen  LVOT:                  Not well visualized    Lower Extremities:      Previously seen ---------------------------------------------------------------------- Doppler - Fetal  Vessels  Umbilical Artery                                                               ADFV    RDFV                                                                Yes      No ---------------------------------------------------------------------- Cervix Uterus Adnexa  Cervix  Not visualized (advanced GA >24wks)  Uterus  No abnormality visualized.  Right Ovary  Not visualized.  Left Ovary  Not visualized.  Cul De Sac  No free fluid seen.  Adnexa  No abnormality visualized ---------------------------------------------------------------------- Comments  This patient has been hospitalized due to chronic  hypertension with superimposed preeclampsia along with  IUGR with intermittent absent end-diastolic flow.  A biophysical profile performed today was 8/8.  The AFI was 12.9 cm.  Vertex presentation.  Doppler studies of the umbilical arteries performed today  showed persistent absent end-diastolic flow.  There were no  signs of reversed end-diastolic flow.  The persistent end-diastolic flow is a new finding as her prior  exam showed intermittent absent end-diastolic flow.  She should continue inpatient management with daily fetal  testing.  Delivery is recommended by 34 weeks.  Delivery is recommended prior to 34 weeks showed reversed  end-diastolic flow be noted, should her preeclampsia worsen,  or should she have any abnormalities in her [facility] labs. ----------------------------------------------------------------------                   Steffan Keys, MD Electronically Signed Final Report   04/05/2024 05:08 pm ----------------------------------------------------------------------   US  MFM FETAL BPP WO NON STRESS Result Date: 04/05/2024 ----------------------------------------------------------------------  OBSTETRICS REPORT                       (Signed Final 04/05/2024 05:08 pm) ---------------------------------------------------------------------- Patient Info  ID #:       [id number]                          D.O.B.:  05-04-1991 (32 yrs)(F)  Name:        Summer Campbell                 Visit Date: 04/05/2024 07:16 am ---------------------------------------------------------------------- Performed By  Attending:        Steffan Keys MD         Ref. Address:     [address]                                                             [place], [place]  [address]  Performed By:     Powell Breen BS       Secondary Phy.:   [facility]                    RDMS                                                             Care  Referred By:      BEBE                [facility] ---------------------------------------------------------------------- Orders  #  Description                           Code        Ordered By  1  US  MFM FETAL BPP WO NON               76819.01    CHARLIE PICKENS     STRESS  2  US  MFM UA CORD DOPPLER                76820.02    CHARLIE PICKENS ----------------------------------------------------------------------  #  Order #                     Accession #                Episode #  1  [id number]                   [id number]                 [id number]  2  [id number]                   [id number]                 [id number] ---------------------------------------------------------------------- Indications  Maternal care for known or suspected poor      O36.5930  fetal growth, third trimester, single or  unspecified fetus IUGR  Severe preeclampsia, third trimester           O14.13  Pre-existing diabetes, type 2, in pregnancy,   O24.113  third trimester (insulin )  Pre-existing essential hypertension            O10.013  complicating pregnancy, third trimester  (procardia  and labetalol )  Obesity complicating pregnancy, third          O99.213  trimester (BMI 51.7)  Anxiety during pregnancy, third trimester      O99.343, F41.9  [redacted] weeks gestation of pregnancy                Z3A.31  ---------------------------------------------------------------------- Fetal Evaluation  Num Of Fetuses:         1  Fetal Heart Rate(bpm):  140  Cardiac Activity:       Observed  Presentation:  Cephalic  Placenta:               Posterior  P. Cord Insertion:      Previously seen  Amniotic Fluid  AFI FV:      Within normal limits  AFI Sum(cm)     %Tile       Largest Pocket(cm)  12.9            38          4.8  RUQ(cm)       RLQ(cm)       LUQ(cm)        LLQ(cm)  3.8           0             4.8            4.3 ---------------------------------------------------------------------- Biophysical Evaluation  Amniotic F.V:   Pocket => 2 cm             F. Tone:        Observed  F. Movement:    Observed                   Score:          8/8  F. Breathing:   Observed ---------------------------------------------------------------------- OB History  Blood Type:   A+  Gravidity:    2          SAB:   1  Living:       0 ---------------------------------------------------------------------- Gestational Age  Best:          31w 6d     Det. By:  Early Ultrasound         EDD:   06/01/24                                      (10/26/23) ---------------------------------------------------------------------- Anatomy  Cranium:               Previously seen        Aortic Arch:            Not well visualized  Cavum:                 Previously seen        Ductal Arch:            Previously seen  Ventricles:            Previously seen        Diaphragm:              Appears normal  Choroid Plexus:        Previously seen        Stomach:                Appears normal, left                                                                        sided  Cerebellum:            Not well visualized    Abdomen:  Previously seen  Posterior Fossa:       Not well visualized    Abdominal Wall:         Not well visualized  Face:                  Not well visualized    Cord Vessels:           Not well visualized  Lips:                  Not  well visualized    Kidneys:                Appear normal  Thoracic:              Previously seen        Bladder:                Appears normal  Heart:                 Not well visualized    Spine:                  Previously seen  RVOT:                  Not well visualized    Upper Extremities:      Previously seen  LVOT:                  Not well visualized    Lower Extremities:      Previously seen ---------------------------------------------------------------------- Doppler - Fetal Vessels  Umbilical Artery                                                              ADFV    RDFV                                                                Yes      No ---------------------------------------------------------------------- Cervix Uterus Adnexa  Cervix  Not visualized (advanced GA >24wks)  Uterus  No abnormality visualized.  Right Ovary  Not visualized.  Left Ovary  Not visualized.  Cul De Sac  No free fluid seen.  Adnexa  No abnormality visualized ---------------------------------------------------------------------- Comments  This patient has been hospitalized due to chronic  hypertension with superimposed preeclampsia along with  IUGR with intermittent absent end-diastolic flow.  A biophysical profile performed today was 8/8.  The AFI was 12.9 cm.  Vertex presentation.  Doppler studies of the umbilical arteries performed today  showed persistent absent end-diastolic flow.  There were no  signs of reversed end-diastolic flow.  The persistent end-diastolic flow is a new finding as her prior  exam showed intermittent absent end-diastolic flow.  She should continue inpatient management with daily fetal  testing.  Delivery is recommended by 34 weeks.  Delivery is recommended prior to 34 weeks showed reversed  end-diastolic flow be noted, should her preeclampsia worsen,  or should she have any abnormalities in her [facility] labs. ----------------------------------------------------------------------  Steffan Keys, MD Electronically Signed Final Report   04/05/2024 05:08 pm ----------------------------------------------------------------------   US  MFM FETAL BPP WO NON STRESS Result Date: 04/04/2024 ----------------------------------------------------------------------  OBSTETRICS REPORT                       (Signed Final 04/04/2024 08:01 pm) ---------------------------------------------------------------------- Patient Info  ID #:       [id number]                          D.O.B.:  04-09-1991 (32 yrs)(F)  Name:       Summer Campbell                 Visit Date: 04/04/2024 03:20 pm ---------------------------------------------------------------------- Performed By  Attending:        Steffan Keys MD         Ref. Address:      [address]                                                              [place], [place]                                                              [address]  Performed By:     Metta Chang RDMS         Secondary Phy.:    [facility]                                                              Care  Referred By:      BEBE                [facility]ICKENS [facility] ---------------------------------------------------------------------- Orders  #  Description                           Code        Ordered By  1  US  MFM FETAL BPP WO NON               76819.01    [facility]     STRESS ----------------------------------------------------------------------  #  Order #                     Accession #                Episode #  1  [id number]  [id number]                 [id number] ---------------------------------------------------------------------- Indications  Non-reactive NST                                O28.9  Maternal care for known or suspected poor       O36.5930  fetal growth, third trimester, single or  unspecified fetus IUGR  Severe preeclampsia, third trimester            O14.13   Pre-existing diabetes, type 2, in pregnancy,    O24.113  third trimester (insulin )  [redacted] weeks gestation of pregnancy                 Z3A.31  Pre-existing essential hypertension             O10.013  complicating pregnancy, third trimester  (procardia  and labetalol )  Obesity complicating pregnancy, third           O99.213  trimester (BMI 51.7)  Anxiety during pregnancy, third trimester       O99.343, F41.9 ---------------------------------------------------------------------- Fetal Evaluation  Num Of Fetuses:          1  Fetal Heart Rate(bpm):   137  Cardiac Activity:        Observed  Presentation:            Cephalic  Placenta:                Posterior  Amniotic Fluid  AFI FV:      Within normal limits  AFI Sum(cm)     %Tile       Largest Pocket(cm)  10.6            20          4.7  RUQ(cm)       RLQ(cm)       LUQ(cm)        LLQ(cm)  4.1           0             1.8            4.7 ---------------------------------------------------------------------- Biophysical Evaluation  Amniotic F.V:   Pocket => 2 cm             F. Tone:         Observed  F. Movement:    Observed                   Score:           8/8  F. Breathing:   Observed ---------------------------------------------------------------------- OB History  Blood Type:   A+  Gravidity:    2          SAB:   1  Living:       0 ---------------------------------------------------------------------- Gestational Age  Best:          31w 5d     Det. By:  Early Ultrasound         EDD:   06/01/24                                      (10/26/23) ---------------------------------------------------------------------- Comments  This patient has been hospitalized due to chronic  hypertension with superimposed preeclampsia along with  IUGR with intermittent absent end-diastolic flow.  A biophysical profile performed  today was 8/8.  The AFI was 10.6 cm.  Vertex presentation. ----------------------------------------------------------------------                   Steffan Keys, MD  Electronically Signed Final Report   04/04/2024 08:01 pm ----------------------------------------------------------------------     Medications:  Scheduled  aspirin   162 mg Oral Daily   bisacodyl  5 mg Oral Once   escitalopram  20 mg Oral Daily   famotidine  20 mg Oral BID   insulin  aspart  0-15 Units Subcutaneous TID PC   insulin  aspart  4 Units Subcutaneous TID WC   insulin  glargine-yfgn  10 Units Subcutaneous Daily   labetalol   800 mg Oral TID   metFORMIN   1,000 mg Oral BID WC   NIFEdipine   60 mg Oral BID   prenatal multivitamin  1 tablet Oral Q1200   sodium chloride  flush  3 mL Intravenous Q12H   valACYclovir  500 mg Oral BID   I have reviewed the patient's current medications.  ASSESSMENT: G2P0010 [redacted]w[redacted]d with CHTN superimposed preeclampsia Patient Active Problem List   Diagnosis Date Noted   IUGR (intrauterine growth restriction) affecting care of mother 03/25/2024   Chronic hypertension with superimposed preeclampsia 03/24/2024   Severe pre-eclampsia 03/24/2024   Preexisting diabetes complicating pregnancy, antepartum 01/01/2024   Preexisting hypertension complicating pregnancy, antepartum 01/01/2024   Supervision of high risk pregnancy, antepartum 12/18/2023   Maternal morbid obesity, antepartum (HCC) 12/18/2023   LGSIL on Pap smear of cervix 06/21/2022   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 If we have severe range pressures, will move towards delivery -labs q 72hr, remain stable 11/3  2) Fetal well being, FGR - Continue BID NSTs - growth 10/23-  vertex/EFW 1323g (8%) with intermittent AEDF - continue dopplers/BPP twice weekly- last 11/3 with stable iAEDF, BPP 8/8 - s/p BMZ 10/22-23  3) T2DM - Continue metformin  1g BID, semglee  10 units daily and SSI for coverage  4) Maternal care -PNV daily -routine OB care -valtrex twice daily for suppression  Continue inpatient management with plans for delivery at 34wks or sooner as clinically indicated.    Continue close monitoring  Winton Felt, MD 04/06/2024,7:55 AM

## 2024-04-07 LAB — GLUCOSE, CAPILLARY
Glucose-Capillary: 115 mg/dL — ABNORMAL HIGH (ref 70–99)
Glucose-Capillary: 139 mg/dL — ABNORMAL HIGH (ref 70–99)

## 2024-04-07 MED ORDER — PANTOPRAZOLE SODIUM 20 MG PO TBEC
20.0000 mg | DELAYED_RELEASE_TABLET | Freq: Every day | ORAL | Status: DC
  Administered 2024-04-07 – 2024-04-14 (×8): 20 mg via ORAL
  Filled 2024-04-07 (×8): qty 1

## 2024-04-07 NOTE — Progress Notes (Signed)
 Patient ID: Summer Campbell, female   DOB: Oct 06, 1990, 33 y.o.   MRN: [id number] FACULTY PRACTICE ANTEPARTUM(COMPREHENSIVE) NOTE  Summer Campbell is a 33 y.o. G2P0010 at [redacted]w[redacted]d by early ultrasound who is admitted for [facility] and SI Preeclampsia and IUGR Fetal presentation is cephalic. Length of Stay:  14  Days  Subjective: Heartburn and nausea improved after tx Patient reports the fetal movement as active. Patient reports uterine contraction  activity as none. Patient reports  vaginal bleeding as none. Patient describes fluid per vagina as None.  Vitals:  Blood pressure (!) 144/66, pulse 78, temperature 98 F (36.7 C), temperature source Oral, resp. rate 19, height 5' 4 (1.626 m), weight (!) 152.5 kg, SpO2 98%. Physical Examination:  General appearance - alert, well appearing, and in no distress Heart - normal rate and regular rhythm Abdomen - soft, nontender, nondistended Fundal Height:  size equals dates Cervical Exam: Not evaluated.  Extremities: extremities normal, atraumatic, no cyanosis or edema and Homans sign is negative, no sign of DVT  Membranes:intact  Fetal Monitoring:   Fetal Heart Rate A   Mode External filed at 04/06/2024 2258  Baseline Rate (A) 135 bpm filed at 04/06/2024 2258  Variability 6-25 BPM filed at 04/06/2024 2258  Accelerations 15 x 15 filed at 04/06/2024 2258  Decelerations Variable filed at 04/06/2024 2258    Labs:  Results for orders placed or performed during the hospital encounter of 03/24/24 (from the past 24 hours)  Glucose, capillary   Collection Time: 04/06/24  8:33 AM  Result Value Ref Range   Glucose-Capillary 104 (H) 70 - 99 mg/dL  Glucose, capillary   Collection Time: 04/06/24  9:11 PM  Result Value Ref Range   Glucose-Capillary 104 (H) 70 - 99 mg/dL     Medications:  Scheduled  aspirin   162 mg Oral Daily   escitalopram  20 mg Oral Daily   insulin  aspart  0-15 Units Subcutaneous TID PC   insulin  aspart  4 Units Subcutaneous TID WC    insulin  glargine-yfgn  10 Units Subcutaneous Daily   labetalol   800 mg Oral TID   metFORMIN   1,000 mg Oral BID WC   NIFEdipine   60 mg Oral BID   pantoprazole  20 mg Oral Daily   prenatal multivitamin  1 tablet Oral Q1200   sodium chloride  flush  3 mL Intravenous Q12H   valACYclovir  500 mg Oral BID   I have reviewed the patient's current medications.  ASSESSMENT: Patient Active Problem List   Diagnosis Date Noted   IUGR (intrauterine growth restriction) affecting care of mother 03/25/2024   Chronic hypertension with superimposed preeclampsia 03/24/2024   Severe pre-eclampsia 03/24/2024   Preexisting diabetes complicating pregnancy, antepartum 01/01/2024   Preexisting hypertension complicating pregnancy, antepartum 01/01/2024   Supervision of high risk pregnancy, antepartum 12/18/2023   Maternal morbid obesity, antepartum (HCC) 12/18/2023   LGSIL on Pap smear of cervix 06/21/2022   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/24/2020   Hyperlipidemia associated with type 2 diabetes mellitus (HCC) 10/24/2020   Hypertension associated with diabetes (HCC) 10/24/2020   Type 2 diabetes mellitus without complication, with long-term current use of insulin  (HCC) 10/24/2020    PLAN: Continue current BP regimen Scheduled BPP twice a week and doppler  Lynwood Solomons 04/07/2024,7:15 AM

## 2024-04-08 DIAGNOSIS — Z0289 Encounter for other administrative examinations: Secondary | ICD-10-CM

## 2024-04-08 LAB — CBC
HCT: 34 % — ABNORMAL LOW (ref 36.0–46.0)
Hemoglobin: 11.2 g/dL — ABNORMAL LOW (ref 12.0–15.0)
MCH: 28.9 pg (ref 26.0–34.0)
MCHC: 32.9 g/dL (ref 30.0–36.0)
MCV: 87.9 fL (ref 80.0–100.0)
Platelets: 239 K/uL (ref 150–400)
RBC: 3.87 MIL/uL (ref 3.87–5.11)
RDW: 13.2 % (ref 11.5–15.5)
WBC: 15 K/uL — ABNORMAL HIGH (ref 4.0–10.5)
nRBC: 0 % (ref 0.0–0.2)

## 2024-04-08 LAB — COMPREHENSIVE METABOLIC PANEL WITH GFR
ALT: 26 U/L (ref 0–44)
AST: 17 U/L (ref 15–41)
Albumin: 2 g/dL — ABNORMAL LOW (ref 3.5–5.0)
Alkaline Phosphatase: 58 U/L (ref 38–126)
Anion gap: 9 (ref 5–15)
BUN: 19 mg/dL (ref 6–20)
CO2: 21 mmol/L — ABNORMAL LOW (ref 22–32)
Calcium: 8.6 mg/dL — ABNORMAL LOW (ref 8.9–10.3)
Chloride: 106 mmol/L (ref 98–111)
Creatinine, Ser: 0.7 mg/dL (ref 0.44–1.00)
GFR, Estimated: >60 mL/min (ref >60)
Glucose, Bld: 104 mg/dL — ABNORMAL HIGH (ref 70–99)
Potassium: 4 mmol/L (ref 3.5–5.1)
Sodium: 136 mmol/L (ref 135–145)
Total Bilirubin: <0.2 mg/dL (ref 0.0–1.2)
Total Protein: 5.2 g/dL — ABNORMAL LOW (ref 6.5–8.1)

## 2024-04-08 LAB — TYPE AND SCREEN
ABO/RH(D): A POS
Antibody Screen: NEGATIVE

## 2024-04-08 LAB — GLUCOSE, CAPILLARY
Glucose-Capillary: 129 mg/dL — ABNORMAL HIGH (ref 70–99)
Glucose-Capillary: 89 mg/dL (ref 70–99)

## 2024-04-08 NOTE — Progress Notes (Signed)
 Patient ID: Summer Campbell, female   DOB: [birth year], 33 y.o.   MRN: [id number] FACULTY PRACTICE ANTEPARTUM(COMPREHENSIVE) NOTE  Summer Campbell is a 33 y.o. G2P0010 at [redacted]w[redacted]d by early ultrasound who is admitted for [facility] and preeclampsia.   Fetal presentation is cephalic. Length of Stay:  15  Days  Subjective:  Patient reports the fetal movement as active. Patient reports uterine contraction  activity as none. Patient reports  vaginal bleeding as none. Patient describes fluid per vagina as None.  Vitals:  Blood pressure 133/72, pulse 87, temperature 97.7 F (36.5 C), temperature source Oral, resp. rate 17, height 5' 4 (1.626 m), weight (!) 152.5 kg, SpO2 97%. Physical Examination:  General appearance - alert, well appearing, and in no distress Heart - normal rate and regular rhythm Abdomen - soft, nontender, nondistended Fundal Height:  size equals dates Extremities: extremities normal, atraumatic, no cyanosis or edema and Homans sign is negative, no sign of DVT  Membranes:intact  Fetal Monitoring:   Fetal Heart Rate A   Mode External filed at 04/07/2024 2223  Baseline Rate (A) 130 bpm filed at 04/07/2024 2223  Variability 6-25 BPM filed at 04/07/2024 2223  Accelerations 15 x 15 filed at 04/07/2024 2223  Decelerations None filed at 04/07/2024 2223    Labs:  Results for orders placed or performed during the hospital encounter of 03/24/24 (from the past 24 hours)  Glucose, capillary   Collection Time: 04/07/24  7:42 PM  Result Value Ref Range   Glucose-Capillary 139 (H) 70 - 99 mg/dL  Type and screen [facility]   Collection Time: 04/08/24  4:53 AM  Result Value Ref Range   ABO/RH(D) A POS    Antibody Screen NEG    Sample Expiration      04/11/2024,2359 Performed at [facility], [address] [place], [place] [address]   Comprehensive metabolic panel   Collection Time: 04/08/24  4:55 AM  Result Value Ref Range   Sodium 136 135 - 145 mmol/L    Potassium 4.0 3.5 - 5.1 mmol/L   Chloride 106 98 - 111 mmol/L   CO2 21 (L) 22 - 32 mmol/L   Glucose, Bld 104 (H) 70 - 99 mg/dL   BUN 19 6 - 20 mg/dL   Creatinine, Ser 9.29 0.44 - 1.00 mg/dL   Calcium 8.6 (L) 8.9 - 10.3 mg/dL   Total Protein 5.2 (L) 6.5 - 8.1 g/dL   Albumin 2.0 (L) 3.5 - 5.0 g/dL   AST 17 15 - 41 U/L   ALT 26 0 - 44 U/L   Alkaline Phosphatase 58 38 - 126 U/L   Total Bilirubin <0.2 0.0 - 1.2 mg/dL   GFR, Estimated >39 >39 mL/min   Anion gap 9 5 - 15  CBC   Collection Time: 04/08/24  4:55 AM  Result Value Ref Range   WBC 15.0 (H) 4.0 - 10.5 K/uL   RBC 3.87 3.87 - 5.11 MIL/uL   Hemoglobin 11.2 (L) 12.0 - 15.0 g/dL   HCT 65.9 (L) 63.9 - 53.9 %   MCV 87.9 80.0 - 100.0 fL   MCH 28.9 26.0 - 34.0 pg   MCHC 32.9 30.0 - 36.0 g/dL   RDW 86.7 88.4 - 84.4 %   Platelets 239 150 - 400 K/uL   nRBC 0.0 0.0 - 0.2 %     Medications:  Scheduled  aspirin   162 mg Oral Daily   escitalopram  20 mg Oral Daily   insulin  aspart  0-15 Units Subcutaneous TID PC   insulin  aspart  4 Units Subcutaneous TID WC   insulin  glargine-yfgn  10 Units Subcutaneous Daily   labetalol   800 mg Oral TID   metFORMIN   1,000 mg Oral BID WC   NIFEdipine   60 mg Oral BID   pantoprazole  20 mg Oral Daily   prenatal multivitamin  1 tablet Oral Q1200   sodium chloride  flush  3 mL Intravenous Q12H   valACYclovir  500 mg Oral BID   I have reviewed the patient's current medications.  ASSESSMENT: Patient Active Problem List   Diagnosis Date Noted   IUGR (intrauterine growth restriction) affecting care of mother 03/25/2024   Chronic hypertension with superimposed preeclampsia 03/24/2024   Severe pre-eclampsia 03/24/2024   Preexisting diabetes complicating pregnancy, antepartum 01/01/2024   Preexisting hypertension complicating pregnancy, antepartum 01/01/2024   Supervision of high risk pregnancy, antepartum 12/18/2023   Maternal morbid obesity, antepartum (HCC) 12/18/2023   LGSIL on Pap smear of cervix  06/21/2022   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/24/2020   Hyperlipidemia associated with type 2 diabetes mellitus (HCC) 10/24/2020   Hypertension associated with diabetes (HCC) 10/24/2020   Type 2 diabetes mellitus without complication, with long-term current use of insulin  (HCC) 10/24/2020    PLAN: BPP and dopplers today  Lynwood Solomons 04/08/2024,9:45 AM

## 2024-04-08 NOTE — Plan of Care (Signed)
  Problem: Education: Goal: Knowledge of disease or condition will improve Outcome: Progressing Goal: Knowledge of the prescribed therapeutic regimen will improve Outcome: Progressing   Problem: Fluid Volume: Goal: Peripheral tissue perfusion will improve Outcome: Progressing   Problem: Clinical Measurements: Goal: Complications related to disease process, condition or treatment will be avoided or minimized Outcome: Progressing   Problem: Health Behavior/Discharge Planning: Goal: Ability to manage health-related needs will improve Outcome: Progressing   Problem: Clinical Measurements: Goal: Ability to maintain clinical measurements within normal limits will improve Outcome: Progressing Goal: Will remain free from infection Outcome: Progressing Goal: Diagnostic test results will improve Outcome: Progressing Goal: Respiratory complications will improve Outcome: Progressing Goal: Cardiovascular complication will be avoided Outcome: Progressing   Problem: Elimination: Goal: Will not experience complications related to bowel motility Outcome: Progressing Goal: Will not experience complications related to urinary retention Outcome: Progressing   Problem: Pain Managment: Goal: General experience of comfort will improve and/or be controlled Outcome: Progressing

## 2024-04-09 ENCOUNTER — Inpatient Hospital Stay (HOSPITAL_COMMUNITY)

## 2024-04-09 DIAGNOSIS — O365930 Maternal care for other known or suspected poor fetal growth, third trimester, not applicable or unspecified: Secondary | ICD-10-CM | POA: Diagnosis not present

## 2024-04-09 DIAGNOSIS — Z3A32 32 weeks gestation of pregnancy: Secondary | ICD-10-CM

## 2024-04-09 DIAGNOSIS — O24113 Pre-existing diabetes mellitus, type 2, in pregnancy, third trimester: Secondary | ICD-10-CM | POA: Diagnosis not present

## 2024-04-09 DIAGNOSIS — O1413 Severe pre-eclampsia, third trimester: Secondary | ICD-10-CM

## 2024-04-09 DIAGNOSIS — Z794 Long term (current) use of insulin: Secondary | ICD-10-CM

## 2024-04-09 DIAGNOSIS — E119 Type 2 diabetes mellitus without complications: Secondary | ICD-10-CM

## 2024-04-09 LAB — GLUCOSE, CAPILLARY
Glucose-Capillary: 116 mg/dL — ABNORMAL HIGH (ref 70–99)
Glucose-Capillary: 133 mg/dL — ABNORMAL HIGH (ref 70–99)

## 2024-04-09 MED ORDER — INSULIN ASPART 100 UNIT/ML IJ SOLN
5.0000 [IU] | Freq: Three times a day (TID) | INTRAMUSCULAR | Status: DC
  Administered 2024-04-09 – 2024-04-13 (×11): 5 [IU] via SUBCUTANEOUS

## 2024-04-09 NOTE — Progress Notes (Signed)
 Patient ID: SHAKIYA MCNEARY, female   DOB: 07/12/1990, 33 y.o.   MRN: [id number] FACULTY PRACTICE ANTEPARTUM(COMPREHENSIVE) NOTE  DOROTEA HAND is a 33 y.o. G2P0010 at [redacted]w[redacted]d by early ultrasound who is admitted for [facility] and preeclampsia .   Fetal presentation is cephalic. Length of Stay:  16  Days  Subjective:  Patient reports the fetal movement as active. Patient reports uterine contraction  activity as none. Patient reports  vaginal bleeding as none. Patient describes fluid per vagina as None.  Vitals:  Blood pressure (!) 140/65, pulse 78, temperature 97.8 F (36.6 C), temperature source Oral, resp. rate 17, height 5' 4 (1.626 m), weight (!) 152.5 kg, SpO2 98%. Physical Examination:  General appearance - alert, well appearing, and in no distress Heart - normal rate and regular rhythm Abdomen - soft, nontender, nondistended Fundal Height:  size equals dates Extremities: extremities normal, atraumatic, no cyanosis or edema and Homans sign is negative, no sign of DVT  Membranes:intact  Fetal Monitoring:   Fetal Heart Rate A   Mode External filed at 04/08/2024 2208  Baseline Rate (A) 135 bpm filed at 04/08/2024 2208  Variability 6-25 BPM filed at 04/08/2024 2208  Accelerations 15 x 15 filed at 04/08/2024 2208  Decelerations None filed at 04/08/2024 2208    Labs:  Results for orders placed or performed during the hospital encounter of 03/24/24 (from the past 24 hours)  Glucose, capillary   Collection Time: 04/08/24 11:42 PM  Result Value Ref Range   Glucose-Capillary 89 70 - 99 mg/dL  Glucose, capillary   Collection Time: 04/09/24  8:05 AM  Result Value Ref Range   Glucose-Capillary 116 (H) 70 - 99 mg/dL     Medications:  Scheduled  aspirin   162 mg Oral Daily   escitalopram  20 mg Oral Daily   insulin  aspart  0-15 Units Subcutaneous TID PC   insulin  aspart  4 Units Subcutaneous TID WC   insulin  glargine-yfgn  10 Units Subcutaneous Daily   labetalol   800 mg Oral TID    metFORMIN   1,000 mg Oral BID WC   NIFEdipine   60 mg Oral BID   pantoprazole  20 mg Oral Daily   prenatal multivitamin  1 tablet Oral Q1200   sodium chloride  flush  3 mL Intravenous Q12H   valACYclovir  500 mg Oral BID   I have reviewed the patient's current medications.  ASSESSMENT: Patient Active Problem List   Diagnosis Date Noted   IUGR (intrauterine growth restriction) affecting care of mother 03/25/2024   Chronic hypertension with superimposed preeclampsia 03/24/2024   Severe pre-eclampsia 03/24/2024   Preexisting diabetes complicating pregnancy, antepartum 01/01/2024   Preexisting hypertension complicating pregnancy, antepartum 01/01/2024   Supervision of high risk pregnancy, antepartum 12/18/2023   Maternal morbid obesity, antepartum (HCC) 12/18/2023   LGSIL on Pap smear of cervix 06/21/2022   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/24/2020   Hyperlipidemia associated with type 2 diabetes mellitus (HCC) 10/24/2020   Hypertension associated with diabetes (HCC) 10/24/2020   Type 2 diabetes mellitus without complication, with long-term current use of insulin  (HCC) 10/24/2020    PLAN: S/p BPP and doppler this AM Increase meal coverage to 5 U TID  Lynwood Solomons 04/09/2024,12:30 PM

## 2024-04-09 NOTE — Inpatient Diabetes Management (Signed)
 Inpatient Diabetes Program Recommendations  ADA Standards of Care 2023 Diabetes in Pregnancy Target Glucose Ranges:  Fasting: 70 - 95 mg/dL 1 hr postprandial:  889 - 140mg /dL (from first bite of meal) 2 hr postprandial:  100 - 120 mg/dL (from first bit of meal)    Lab Results  Component Value Date   GLUCAP 116 (H) 04/09/2024   HGBA1C 6.8 (H) 01/01/2024    Review of Glycemic Control  Latest Reference Range & Units 04/07/24 07:55 04/07/24 19:42 04/08/24 12:15 04/08/24 23:42 04/09/24 08:05  Glucose-Capillary 70 - 99 mg/dL 884 (H) 860 (H) 870 (H) 89 116 (H)  (H): Data is abnormally high  Diabetes history: DM2  Outpatient Diabetes medications: Tresiba 25 units at bedtime, Novolog  22 units TID with meals, Metformin  1000 mg BID, Dexcom G7   Prior to pregnancy: Mounjaro weekly, Farxiga every day, Metformin  1000 mg BID.    Current orders for Inpatient glycemic control: Novolog  4 units TID with meals, Novolog  0-15 units TID after meals, Metformin  1000 mg BID, Semglee  10 units QD  Inpatient Diabetes Program Recommendations:    Please consider: -Novolog  5 units TID with meals  Thank you, Wyvonna Pinal, MSN, CDCES Diabetes Coordinator Inpatient Diabetes Program [phone] (team pager from 8a-5p)

## 2024-04-10 LAB — GLUCOSE, CAPILLARY
Glucose-Capillary: 104 mg/dL — ABNORMAL HIGH (ref 70–99)
Glucose-Capillary: 69 mg/dL — ABNORMAL LOW (ref 70–99)
Glucose-Capillary: 73 mg/dL (ref 70–99)
Glucose-Capillary: 81 mg/dL (ref 70–99)

## 2024-04-10 MED ORDER — GLUCAGON HCL RDNA (DIAGNOSTIC) 1 MG IJ SOLR: 1.0000 mg | Freq: Once | INTRAMUSCULAR | Status: DC | PRN

## 2024-04-10 NOTE — Progress Notes (Signed)
 Hypoglycemic Event at 0002  CBG: 69  Treatment: 4oz apple juice   Symptoms: None  Follow-up CBG: Time:0015 CBG Result: 65  Possible Reasons for Event: Unknown   Comments/MD notified: MD notified at 0024     Hypoglycemic Event at 0015  CBG: 65  Treatment: 4oz apple juice + Yogurt and vanilla Wafers ([employer]- diabetes snack bag)   Symptoms: None  Follow-up CBG: Time:0045 CBG Result: 104  Possible Reasons for Event: Unknown   Comments/MD notified: MD notified at 0024, New orders for glucagon placed by Dr Fredirick if another glycemic event occurs.    Dreshawn Hendershott G Blakelynn Scheeler

## 2024-04-10 NOTE — Assessment & Plan Note (Signed)
 Estimated fetal weight at 8 percentile Absent end-diastolic flow Normal AFI Last BPP 8 of 8 Fetal heart tracing is category 1 Plan for follow-up growth on 11/13

## 2024-04-10 NOTE — Assessment & Plan Note (Signed)
 Currently on Semglee  10 nightly and short acting 5 with meals Continues on metformin  twice daily Has had a few low blood sugars which responded well to p.o. intake yesterday. If continue to have low sugars consider dropping her Semglee  as well.

## 2024-04-10 NOTE — Plan of Care (Signed)
  Problem: Education: Goal: Knowledge of disease or condition will improve Outcome: Progressing Goal: Knowledge of the prescribed therapeutic regimen will improve Outcome: Progressing   Problem: Fluid Volume: Goal: Peripheral tissue perfusion will improve Outcome: Progressing   Problem: Clinical Measurements: Goal: Complications related to disease process, condition or treatment will be avoided or minimized Outcome: Progressing   Problem: Health Behavior/Discharge Planning: Goal: Ability to manage health-related needs will improve Outcome: Progressing   Problem: Clinical Measurements: Goal: Ability to maintain clinical measurements within normal limits will improve Outcome: Progressing Goal: Will remain free from infection Outcome: Progressing Goal: Diagnostic test results will improve Outcome: Progressing Goal: Respiratory complications will improve Outcome: Progressing Goal: Cardiovascular complication will be avoided Outcome: Progressing   Problem: Activity: Goal: Risk for activity intolerance will decrease Outcome: Progressing   Problem: Nutrition: Goal: Adequate nutrition will be maintained Outcome: Progressing   Problem: Pain Managment: Goal: General experience of comfort will improve and/or be controlled Outcome: Progressing

## 2024-04-10 NOTE — Assessment & Plan Note (Addendum)
 Currently on Procardia  60 twice daily Continue labetalol  800 3 times daily Status post magnesium x 1 with plan for repeat peripartum BPs remain in the mild range Last labs were within normal limit

## 2024-04-10 NOTE — Progress Notes (Signed)
 Patient ID: Summer Campbell, female   DOB: Jan 29, 1991, 33 y.o.   MRN: [id number] FACULTY PRACTICE ANTEPARTUM(COMPREHENSIVE) NOTE  Summer Campbell is a 33 y.o. G2P0010 at [redacted]w[redacted]d by best clinical estimate who is admitted for chronic hypertension with superimposed preeclampsia with severe features.   Fetal presentation is cephalic. Length of Stay:  17  Days  ASSESSMENT: Principal Problem:   Chronic hypertension with superimposed preeclampsia Active Problems:   Herpes simplex infection of perianal skin   Preexisting diabetes complicating pregnancy, antepartum   Severe pre-eclampsia   IUGR (intrauterine growth restriction) affecting care of mother   PLAN: Chronic hypertension with superimposed preeclampsia Currently on Procardia  60 twice daily Continue labetalol  800 3 times daily Status post magnesium x 1 with plan for repeat peripartum BPs remain in the mild range Last labs were within normal limit  IUGR (intrauterine growth restriction) affecting care of mother Estimated fetal weight at 8 percentile Absent end-diastolic flow Normal AFI Last BPP 8 of 8 Fetal heart tracing is category 1  Herpes simplex infection of perianal skin Continue Valtrex for prophylaxis  Preexisting diabetes complicating pregnancy, antepartum Currently on Semglee  10 nightly and short acting 5 with meals Continues on metformin  twice daily Has had a few low blood sugars which responded well to p.o. intake yesterday. If continue to have low sugars consider dropping her Semglee  as well.   Subjective: Feels well today.  Denies headache, vision changes, right upper quadrant pain. Patient reports the fetal movement as active. Patient reports uterine contraction  activity as none. Patient reports  vaginal bleeding as none. Patient describes fluid per vagina as None.  Vitals:  Blood pressure 136/65, pulse 84, temperature 97.9 F (36.6 C), temperature source Oral, resp. rate 18, height 5' 4 (1.626 m), weight  (!) 152.5 kg, SpO2 100%. Physical Examination:  General appearance - alert, well appearing, and in no distress Chest - normal effort Abdomen - gravid, non-tender Fundal Height:  size equals dates Extremities: extremities normal, atraumatic, no cyanosis or edema  Membranes:intact  Fetal Monitoring:  Baseline: 125 bpm, Variability: Good {> 6 bpm), Accelerations: Reactive, and Decelerations: Absent  Labs:  Results for orders placed or performed during the hospital encounter of 03/24/24 (from the past 24 hours)  Glucose, capillary   Collection Time: 04/09/24  8:05 AM  Result Value Ref Range   Glucose-Capillary 116 (H) 70 - 99 mg/dL  Glucose, capillary   Collection Time: 04/09/24  9:32 PM  Result Value Ref Range   Glucose-Capillary 133 (H) 70 - 99 mg/dL  Glucose, capillary   Collection Time: 04/10/24 12:02 AM  Result Value Ref Range   Glucose-Capillary 69 (L) 70 - 99 mg/dL  Glucose, capillary   Collection Time: 04/10/24 12:44 AM  Result Value Ref Range   Glucose-Capillary 104 (H) 70 - 99 mg/dL    Imaging Studies:    US  MFM UA CORD DOPPLER Result Date: 04/05/2024 ----------------------------------------------------------------------  OBSTETRICS REPORT                       (Signed Final 04/05/2024 05:08 pm) ---------------------------------------------------------------------- Patient Info  ID #:       [id number]                          D.O.B.:  Aug 09, 1990 (33 yrs)(F)  Name:       Summer Campbell                 Visit Date:  04/05/2024 07:16 am ---------------------------------------------------------------------- Performed By  Attending:        Steffan Keys MD         Ref. Address:     [address]                                                             [place], [place]                                                             [address]  Performed By:     Powell Breen BS       Secondary Phy.:   [facility]                    RDMS                                                              Care  Referred By:      BEBE                [facility] ---------------------------------------------------------------------- Orders  #  Description                           Code        Ordered By  1  US  MFM FETAL BPP WO NON               76819.01    CHARLIE PICKENS     STRESS  2  US  MFM UA CORD DOPPLER                23179.97    CHARLIE PICKENS ----------------------------------------------------------------------  #  Order #                     Accession #                Episode #  1  [id number]                   [id number]                 [id number]  2  [id number]                   [id number]                 [id number] ---------------------------------------------------------------------- Indications  Maternal care for known or suspected poor      O36.5930  fetal growth,  third trimester, single or  unspecified fetus IUGR  Severe preeclampsia, third trimester           O14.13  Pre-existing diabetes, type 2, in pregnancy,   O24.113  third trimester (insulin )  Pre-existing essential hypertension            O10.013  complicating pregnancy, third trimester  (procardia  and labetalol )  Obesity complicating pregnancy, third          O99.213  trimester (BMI 51.7)  Anxiety during pregnancy, third trimester      O99.343, F41.9  [redacted] weeks gestation of pregnancy                Z3A.31 ---------------------------------------------------------------------- Fetal Evaluation  Num Of Fetuses:         1  Fetal Heart Rate(bpm):  140  Cardiac Activity:       Observed  Presentation:           Cephalic  Placenta:               Posterior  P. Cord Insertion:      Previously seen  Amniotic Fluid  AFI FV:      Within normal limits  AFI Sum(cm)     %Tile       Largest Pocket(cm)  12.9            38          4.8  RUQ(cm)       RLQ(cm)       LUQ(cm)        LLQ(cm)  3.8           0             4.8            4.3  ---------------------------------------------------------------------- Biophysical Evaluation  Amniotic F.V:   Pocket => 2 cm             F. Tone:        Observed  F. Movement:    Observed                   Score:          8/8  F. Breathing:   Observed ---------------------------------------------------------------------- OB History  Blood Type:   A+  Gravidity:    2          SAB:   1  Living:       0 ---------------------------------------------------------------------- Gestational Age  Best:          31w 6d     Det. By:  Early Ultrasound         EDD:   06/01/24                                      (10/26/23) ---------------------------------------------------------------------- Anatomy  Cranium:               Previously seen        Aortic Arch:            Not well visualized  Cavum:                 Previously seen        Ductal Arch:            Previously seen  Ventricles:            Previously seen  Diaphragm:              Appears normal  Choroid Plexus:        Previously seen        Stomach:                Appears normal, left                                                                        sided  Cerebellum:            Not well visualized    Abdomen:                Previously seen  Posterior Fossa:       Not well visualized    Abdominal Wall:         Not well visualized  Face:                  Not well visualized    Cord Vessels:           Not well visualized  Lips:                  Not well visualized    Kidneys:                Appear normal  Thoracic:              Previously seen        Bladder:                Appears normal  Heart:                 Not well visualized    Spine:                  Previously seen  RVOT:                  Not well visualized    Upper Extremities:      Previously seen  LVOT:                  Not well visualized    Lower Extremities:      Previously seen ---------------------------------------------------------------------- Doppler - Fetal Vessels  Umbilical Artery                                                               ADFV    RDFV                                                                Yes      No ---------------------------------------------------------------------- Cervix Uterus Adnexa  Cervix  Not visualized (advanced GA >24wks)  Uterus  No abnormality visualized.  Right Ovary  Not visualized.  Left Ovary  Not visualized.  Cul De Sac  No free fluid seen.  Adnexa  No abnormality visualized ---------------------------------------------------------------------- Comments  This patient has been hospitalized due to chronic  hypertension with superimposed preeclampsia along with  IUGR with intermittent absent end-diastolic flow.  A biophysical profile performed today was 8/8.  The AFI was 12.9 cm.  Vertex presentation.  Doppler studies of the umbilical arteries performed today  showed persistent absent end-diastolic flow.  There were no  signs of reversed end-diastolic flow.  The persistent end-diastolic flow is a new finding as her prior  exam showed intermittent absent end-diastolic flow.  She should continue inpatient management with daily fetal  testing.  Delivery is recommended by 34 weeks.  Delivery is recommended prior to 34 weeks showed reversed  end-diastolic flow be noted, should her preeclampsia worsen,  or should she have any abnormalities in her [facility] labs. ----------------------------------------------------------------------                   Steffan Keys, MD Electronically Signed Final Report   04/05/2024 05:08 pm ----------------------------------------------------------------------   US  MFM FETAL BPP WO NON STRESS Result Date: 04/05/2024 ----------------------------------------------------------------------  OBSTETRICS REPORT                       (Signed Final 04/05/2024 05:08 pm) ---------------------------------------------------------------------- Patient Info  ID #:       [id number]                          D.O.B.:  [birth year] (32 yrs)(F)  Name:        Summer Campbell                 Visit Date: 04/05/2024 07:16 am ---------------------------------------------------------------------- Performed By  Attending:        Steffan Keys MD         Ref. Address:     [address]                                                             [place], [place]                                                             [address]  Performed By:     Powell Breen BS       Secondary Phy.:   [facility]                    RDMS                                                             Care  Referred By:      CHARLIE                [facility] ---------------------------------------------------------------------- Orders  #  Description                           Code        Ordered By  1  US  MFM FETAL BPP WO NON               76819.01    CHARLIE PICKENS     STRESS  2  US  MFM UA CORD DOPPLER                76820.02    CHARLIE PICKENS ----------------------------------------------------------------------  #  Order #                     Accession #                Episode #  1  [id number]                   [id number]                 [id number]  2  [id number]                   [id number]                 [id number] ---------------------------------------------------------------------- Indications  Maternal care for known or suspected poor      O36.5930  fetal growth, third trimester, single or  unspecified fetus IUGR  Severe preeclampsia, third trimester           O14.13  Pre-existing diabetes, type 2, in pregnancy,   O24.113  third trimester (insulin )  Pre-existing essential hypertension            O10.013  complicating pregnancy, third trimester  (procardia  and labetalol )  Obesity complicating pregnancy, third          O99.213  trimester (BMI 51.7)  Anxiety during pregnancy, third trimester      O99.343, F41.9  [redacted] weeks gestation of pregnancy                Z3A.31  ---------------------------------------------------------------------- Fetal Evaluation  Num Of Fetuses:         1  Fetal Heart Rate(bpm):  140  Cardiac Activity:       Observed  Presentation:           Cephalic  Placenta:               Posterior  P. Cord Insertion:      Previously seen  Amniotic Fluid  AFI FV:      Within normal limits  AFI Sum(cm)     %Tile       Largest Pocket(cm)  12.9            38          4.8  RUQ(cm)       RLQ(cm)       LUQ(cm)        LLQ(cm)  3.8           0             4.8            4.3 ---------------------------------------------------------------------- Biophysical Evaluation  Amniotic F.V:   Pocket =>  2 cm             F. Tone:        Observed  F. Movement:    Observed                   Score:          8/8  F. Breathing:   Observed ---------------------------------------------------------------------- OB History  Blood Type:   A+  Gravidity:    2          SAB:   1  Living:       0 ---------------------------------------------------------------------- Gestational Age  Best:          31w 6d     Det. By:  Early Ultrasound         EDD:   06/01/24                                      (10/26/23) ---------------------------------------------------------------------- Anatomy  Cranium:               Previously seen        Aortic Arch:            Not well visualized  Cavum:                 Previously seen        Ductal Arch:            Previously seen  Ventricles:            Previously seen        Diaphragm:              Appears normal  Choroid Plexus:        Previously seen        Stomach:                Appears normal, left                                                                        sided  Cerebellum:            Not well visualized    Abdomen:                Previously seen  Posterior Fossa:       Not well visualized    Abdominal Wall:         Not well visualized  Face:                  Not well visualized    Cord Vessels:           Not well visualized  Lips:                  Not  well visualized    Kidneys:                Appear normal  Thoracic:              Previously seen        Bladder:  Appears normal  Heart:                 Not well visualized    Spine:                  Previously seen  RVOT:                  Not well visualized    Upper Extremities:      Previously seen  LVOT:                  Not well visualized    Lower Extremities:      Previously seen ---------------------------------------------------------------------- Doppler - Fetal Vessels  Umbilical Artery                                                              ADFV    RDFV                                                                Yes      No ---------------------------------------------------------------------- Cervix Uterus Adnexa  Cervix  Not visualized (advanced GA >24wks)  Uterus  No abnormality visualized.  Right Ovary  Not visualized.  Left Ovary  Not visualized.  Cul De Sac  No free fluid seen.  Adnexa  No abnormality visualized ---------------------------------------------------------------------- Comments  This patient has been hospitalized due to chronic  hypertension with superimposed preeclampsia along with  IUGR with intermittent absent end-diastolic flow.  A biophysical profile performed today was 8/8.  The AFI was 12.9 cm.  Vertex presentation.  Doppler studies of the umbilical arteries performed today  showed persistent absent end-diastolic flow.  There were no  signs of reversed end-diastolic flow.  The persistent end-diastolic flow is a new finding as her prior  exam showed intermittent absent end-diastolic flow.  She should continue inpatient management with daily fetal  testing.  Delivery is recommended by 34 weeks.  Delivery is recommended prior to 34 weeks showed reversed  end-diastolic flow be noted, should her preeclampsia worsen,  or should she have any abnormalities in her [facility] labs. ----------------------------------------------------------------------                    Steffan Keys, MD Electronically Signed Final Report   04/05/2024 05:08 pm ----------------------------------------------------------------------   US  MFM FETAL BPP WO NON STRESS Result Date: 04/04/2024 ----------------------------------------------------------------------  OBSTETRICS REPORT                       (Signed Final 04/04/2024 08:01 pm) ---------------------------------------------------------------------- Patient Info  ID #:       [id number]                          D.O.B.:  1990/09/25 (32 yrs)(F)  Name:       Summer Campbell                 Visit Date: 04/04/2024 03:20 pm ---------------------------------------------------------------------- Performed By  Attending:  Steffan Keys MD         Ref. Address:      [address]                                                              [place], [place]                                                              [address]  Performed By:     Metta Chang RDMS         Secondary Phy.:    [facility]                                                              Care  Referred By:      BEBE                [facility]ICKENS [facility] ---------------------------------------------------------------------- Orders  #  Description                           Code        Ordered By  1  US  MFM FETAL BPP WO NON               76819.01    [facility]     STRESS ----------------------------------------------------------------------  #  Order #                     Accession #                Episode #  1  [id number]                   [id number]                 [id number] ---------------------------------------------------------------------- Indications  Non-reactive NST                                O28.9  Maternal care for known or suspected poor       O36.5930  fetal growth, third trimester, single or  unspecified fetus IUGR  Severe preeclampsia, third trimester            O14.13   Pre-existing diabetes, type 2, in pregnancy,    O24.113  third trimester (insulin )  [redacted] weeks gestation of pregnancy  Z3A.31  Pre-existing essential hypertension             O10.013  complicating pregnancy, third trimester  (procardia  and labetalol )  Obesity complicating pregnancy, third           O99.213  trimester (BMI 51.7)  Anxiety during pregnancy, third trimester       O99.343, F41.9 ---------------------------------------------------------------------- Fetal Evaluation  Num Of Fetuses:          1  Fetal Heart Rate(bpm):   137  Cardiac Activity:        Observed  Presentation:            Cephalic  Placenta:                Posterior  Amniotic Fluid  AFI FV:      Within normal limits  AFI Sum(cm)     %Tile       Largest Pocket(cm)  10.6            20          4.7  RUQ(cm)       RLQ(cm)       LUQ(cm)        LLQ(cm)  4.1           0             1.8            4.7 ---------------------------------------------------------------------- Biophysical Evaluation  Amniotic F.V:   Pocket => 2 cm             F. Tone:         Observed  F. Movement:    Observed                   Score:           8/8  F. Breathing:   Observed ---------------------------------------------------------------------- OB History  Blood Type:   A+  Gravidity:    2          SAB:   1  Living:       0 ---------------------------------------------------------------------- Gestational Age  Best:          31w 5d     Det. By:  Early Ultrasound         EDD:   06/01/24                                      (10/26/23) ---------------------------------------------------------------------- Comments  This patient has been hospitalized due to chronic  hypertension with superimposed preeclampsia along with  IUGR with intermittent absent end-diastolic flow.  A biophysical profile performed today was 8/8.  The AFI was 10.6 cm.  Vertex presentation. ----------------------------------------------------------------------                   Steffan Keys, MD  Electronically Signed Final Report   04/04/2024 08:01 pm ----------------------------------------------------------------------   US  MFM UA CORD DOPPLER Result Date: 04/02/2024 ----------------------------------------------------------------------  OBSTETRICS REPORT                       (Signed Final 04/02/2024 09:54 am) ---------------------------------------------------------------------- Patient Info  ID #:       [id number]                          D.O.B.:  January 16, 1991 (32 yrs)(F)  Name:  Summer Campbell                 Visit Date: 04/01/2024 03:40 pm ---------------------------------------------------------------------- Performed By  Attending:        Steffan Keys MD         Ref. Address:     [address]                                                             [place], [place]                                                             [address]  Performed By:     Burnard LITTIE Custard          Secondary Phy.:   [facility]                    RDMS                                                             Care  Referred By:      BEBE                [facility]ICKENS [facility] ---------------------------------------------------------------------- Orders  #  Description                           Code        Ordered By  1  US  MFM UA CORD DOPPLER                76820.02    UGONNA ANYANWU  2  US  MFM FETAL BPP WO NON               76819.01    UGONNA ANYANWU     STRESS ----------------------------------------------------------------------  #  Order #                     Accession #                Episode #  1  [id number]                   [id number]                 [id number]  2  [id number]                   [id number]  [id number] ---------------------------------------------------------------------- Indications  Severe preeclampsia, third trimester           O14.13  Pre-existing diabetes, type 2, in pregnancy,   O24.113   third trimester (insulin )  Maternal care for known or suspected poor      O36.5930  fetal growth, third trimester, single or  unspecified fetus IUGR  Pre-existing essential hypertension            O10.013  complicating pregnancy, third trimester  (procardia  and labetalol )  Obesity complicating pregnancy, third          O99.213  trimester (BMI 51.7)  [redacted] weeks gestation of pregnancy                Z3A.31  Anxiety during pregnancy, third trimester      O99.343, F41.9 ---------------------------------------------------------------------- Fetal Evaluation  Num Of Fetuses:         1  Fetal Heart Rate(bpm):  146  Cardiac Activity:       Observed  Presentation:           Cephalic  Placenta:               Posterior  Amniotic Fluid  AFI FV:      Within normal limits  AFI Sum(cm)     %Tile       Largest Pocket(cm)  10.4            18          3.8  RUQ(cm)       RLQ(cm)       LUQ(cm)        LLQ(cm)  3.8           1.7           1.5            3.4 ---------------------------------------------------------------------- Biophysical Evaluation  Amniotic F.V:   Within normal limits       F. Tone:        Observed  F. Movement:    Observed                   Score:          8/8  F. Breathing:   Observed ---------------------------------------------------------------------- OB History  Blood Type:   A+  Gravidity:    2          SAB:   1  Living:       0 ---------------------------------------------------------------------- Gestational Age  Best:          31w 2d     Det. By:  Early Ultrasound         EDD:   06/01/24                                      (10/26/23) ---------------------------------------------------------------------- Anatomy  Stomach:               Appears normal, left   Bladder:                Appears normal                         sided ---------------------------------------------------------------------- Doppler - Fetal Vessels  Umbilical Artery   S/D     %tile      RI    %tile      PI    %tile  PSV    ADFV    RDFV                                                      (cm/s)    4.9   > 97.5     0.8   > 97.5     1.5   > 97.5     38.5     Yes      No ---------------------------------------------------------------------- Comments  This patient has been hospitalized due to preeclampsia with  severe features.  IUGR was noted on her prior ultrasound  exam.  A BPP performed today was 8/8.  The total AFI was 11.4 cm.  Vertex presentation.  Doppler studies of the umbilical arteries continues to show an  elevated S/D ratio of 4.9.  An occasional episode of absent  end-diastolic flow was noted.  There were no signs of  reversed end-diastolic flow noted today.  She should continue the plan for inpatient management,  twice weekly umbilical artery Doppler studies and BPP's, and  twice daily NSTs.  Delivery is planned for 34 weeks. ----------------------------------------------------------------------                   Steffan Keys, MD Electronically Signed Final Report   04/02/2024 09:54 am ----------------------------------------------------------------------   US  MFM FETAL BPP WO NON STRESS Result Date: 04/02/2024 ----------------------------------------------------------------------  OBSTETRICS REPORT                       (Signed Final 04/02/2024 09:54 am) ---------------------------------------------------------------------- Patient Info  ID #:       [id number]                          D.O.B.:  01-Jul-1990 (32 yrs)(F)  Name:       Summer Campbell                 Visit Date: 04/01/2024 03:40 pm ---------------------------------------------------------------------- Performed By  Attending:        Steffan Keys MD         Ref. Address:     [address]                                                             [place], [place]                                                             [address]  Performed By:     Burnard CROME Custard          Secondary Phy.:   [facility]                    RDMS  Care  Referred By:      BEBE                [facility]ICKENS MD                               [facility] ---------------------------------------------------------------------- Orders  #  Description                           Code        Ordered By  1  US  MFM UA CORD DOPPLER                76820.02    UGONNA ANYANWU  2  US  MFM FETAL BPP WO NON               76819.01    UGONNA ANYANWU     STRESS ----------------------------------------------------------------------  #  Order #                     Accession #                Episode #  1  [id number]                   [id number]                 [id number]  2  [id number]                   [id number]                 [id number] ---------------------------------------------------------------------- Indications  Severe preeclampsia, third trimester           O14.13  Pre-existing diabetes, type 2, in pregnancy,   O24.113  third trimester (insulin )  Maternal care for known or suspected poor      O36.5930  fetal growth, third trimester, single or  unspecified fetus IUGR  Pre-existing essential hypertension            O10.013  complicating pregnancy, third trimester  (procardia  and labetalol )  Obesity complicating pregnancy, third          O99.213  trimester (BMI 51.7)  [redacted] weeks gestation of pregnancy                Z3A.31  Anxiety during pregnancy, third trimester      O99.343, F41.9 ---------------------------------------------------------------------- Fetal Evaluation  Num Of Fetuses:         1  Fetal Heart Rate(bpm):  146  Cardiac Activity:       Observed  Presentation:           Cephalic  Placenta:               Posterior  Amniotic Fluid  AFI FV:      Within normal limits  AFI Sum(cm)     %Tile       Largest Pocket(cm)  10.4            18          3.8  RUQ(cm)       RLQ(cm)       LUQ(cm)        LLQ(cm)  3.8  1.7           1.5            3.4 ----------------------------------------------------------------------  Biophysical Evaluation  Amniotic F.V:   Within normal limits       F. Tone:        Observed  F. Movement:    Observed                   Score:          8/8  F. Breathing:   Observed ---------------------------------------------------------------------- OB History  Blood Type:   A+  Gravidity:    2          SAB:   1  Living:       0 ---------------------------------------------------------------------- Gestational Age  Best:          31w 2d     Det. By:  Early Ultrasound         EDD:   06/01/24                                      (10/26/23) ---------------------------------------------------------------------- Anatomy  Stomach:               Appears normal, left   Bladder:                Appears normal                         sided ---------------------------------------------------------------------- Doppler - Fetal Vessels  Umbilical Artery   S/D     %tile      RI    %tile      PI    %tile     PSV    ADFV    RDFV                                                     (cm/s)    4.9   > 97.5     0.8   > 97.5     1.5   > 97.5     38.5     Yes      No ---------------------------------------------------------------------- Comments  This patient has been hospitalized due to preeclampsia with  severe features.  IUGR was noted on her prior ultrasound  exam.  A BPP performed today was 8/8.  The total AFI was 11.4 cm.  Vertex presentation.  Doppler studies of the umbilical arteries continues to show an  elevated S/D ratio of 4.9.  An occasional episode of absent  end-diastolic flow was noted.  There were no signs of  reversed end-diastolic flow noted today.  She should continue the plan for inpatient management,  twice weekly umbilical artery Doppler studies and BPP's, and  twice daily NSTs.  Delivery is planned for 34 weeks. ----------------------------------------------------------------------                   Steffan Keys, MD Electronically Signed Final Report   04/02/2024 09:54 am  ----------------------------------------------------------------------   US  MFM UA CORD DOPPLER Result Date: 03/29/2024 ----------------------------------------------------------------------  OBSTETRICS REPORT                       (Signed Final 03/29/2024  05:29 pm) ---------------------------------------------------------------------- Patient Info  ID #:       [id number]                          D.O.B.:  23-Mar-1991 (32 yrs)(F)  Name:       Summer Campbell                 Visit Date: 03/29/2024 08:10 am ---------------------------------------------------------------------- Performed By  Attending:        Steffan Keys MD         Ref. Address:     [address]                                                             [place], [place]                                                             [address]  Performed By:     Powell Breen BS       Secondary Phy.:   [facility]                    RDMS                                                             Care  Referred By:      BEBE                [facility] ---------------------------------------------------------------------- Orders  #  Description                           Code        Ordered By  1  US  MFM UA CORD DOPPLER                76820.02    LAWRENCE BASS  2  US  MFM FETAL BPP WO NON               23180.98    JERILYNN BUDDLE     STRESS ----------------------------------------------------------------------  #  Order #                     Accession #                Episode #  1  [id number]                   [id number]  [id number]  2  [id number]                   [id number]                 [id number] ---------------------------------------------------------------------- Indications  Maternal care for known or suspected poor      O36.5930  fetal growth, third trimester, single or  unspecified fetus IUGR  Severe preeclampsia, third trimester            O14.13  Pre-existing diabetes, type 2, in pregnancy,   O24.113  third trimester (insulin )  Anxiety during pregnancy, third trimester      O99.343, F41.9  Obesity complicating pregnancy, third          O99.213  trimester (BMI 51.7)  Pre-existing essential hypertension            O10.013  complicating pregnancy, third trimester  (procardia  and labetalol )  LR NIPS, neg Horizon, neg AFP  [redacted] weeks gestation of pregnancy                Z3A.30 ---------------------------------------------------------------------- Fetal Evaluation  Num Of Fetuses:         1  Fetal Heart Rate(bpm):  129  Cardiac Activity:       Observed  Presentation:           Cephalic  Placenta:               Posterior  P. Cord Insertion:      Previously seen  Amniotic Fluid  AFI FV:      Within normal limits  AFI Sum(cm)     %Tile       Largest Pocket(cm)  19.5            75          5.2  RUQ(cm)       RLQ(cm)       LUQ(cm)        LLQ(cm)  5.2           4.6           4.7            5 ---------------------------------------------------------------------- Biophysical Evaluation  Amniotic F.V:   Pocket => 2 cm             F. Tone:        Observed  F. Movement:    Observed                   Score:          8/8  F. Breathing:   Observed ---------------------------------------------------------------------- OB History  Blood Type:   A+  Gravidity:    2          SAB:   1  Living:       0 ---------------------------------------------------------------------- Gestational Age  Best:          30w 6d     Det. By:  Early Ultrasound         EDD:   06/01/24                                      (10/26/23) ---------------------------------------------------------------------- Anatomy  Cranium:               Appears normal         Stomach:  Appears normal, left                                                                        sided  Thoracic:              Appears normal         Kidneys:                Appear normal  Diaphragm:             Appears normal          Bladder:                Appears normal ---------------------------------------------------------------------- Doppler - Fetal Vessels  Umbilical Artery   S/D     %tile      RI    %tile      PI    %tile     PSV    ADFV    RDFV                                                     (cm/s)    4.5   > 97.5     0.8   > 97.5     1.5   > 97.5     35.7     Yes      No ---------------------------------------------------------------------- Cervix Uterus Adnexa  Cervix  Not visualized (advanced GA >24wks)  Uterus  No abnormality visualized.  Right Ovary  Not visualized.  Left Ovary  Not visualized.  Cul De Sac  No free fluid seen.  Adnexa  No abnormality visualized ---------------------------------------------------------------------- Comments  This patient has been hospitalized due to preeclampsia with  severe features.  IUGR was noted on prior ultrasound exam.  A BPP performed today was 8/8.  The total AFI was 19.5 cm.  Vertex presentation.  Doppler studies of the umbilical arteries continues to show an  elevated S/D ratio of 4.5.  An occasional episode of absent  end-diastolic flow was noted.  There were no signs of  reversed end-diastolic flow noted today.  She should continue the plan for inpatient management,  twice weekly umbilical artery Doppler studies and BPP's, and  twice daily NSTs.  Delivery is planned for 34 weeks. ----------------------------------------------------------------------                   Steffan Keys, MD Electronically Signed Final Report   03/29/2024 05:29 pm ----------------------------------------------------------------------   US  MFM FETAL BPP WO NON STRESS Result Date: 03/29/2024 ----------------------------------------------------------------------  OBSTETRICS REPORT                       (Signed Final 03/29/2024 05:29 pm) ---------------------------------------------------------------------- Patient Info  ID #:       [id number]                          D.O.B.:  03/28/1991 (32 yrs)(F)   Name:       Summer Campbell  Visit Date: 03/29/2024 08:10 am ---------------------------------------------------------------------- Performed By  Attending:        Steffan Keys MD         Ref. Address:     [address]                                                             [place], [place]                                                             [address]  Performed By:     Powell Breen BS       Secondary Phy.:   [facility]                    RDMS                                                             Care  Referred By:      BEBE                [facility] ---------------------------------------------------------------------- Orders  #  Description                           Code        Ordered By  1  US  MFM UA CORD DOPPLER                76820.02    LAWRENCE BASS  2  US  MFM FETAL BPP WO NON               76819.01    JERILYNN BUDDLE     STRESS ----------------------------------------------------------------------  #  Order #                     Accession #                Episode #  1  [id number]                   [id number]                 [id number]  2  [id number]                   [id number]                 [id number] ---------------------------------------------------------------------- Indications  Maternal care for known or suspected poor      O36.5930  fetal  growth, third trimester, single or  unspecified fetus IUGR  Severe preeclampsia, third trimester           O14.13  Pre-existing diabetes, type 2, in pregnancy,   O24.113  third trimester (insulin )  Anxiety during pregnancy, third trimester      O99.343, F41.9  Obesity complicating pregnancy, third          O99.213  trimester (BMI 51.7)  Pre-existing essential hypertension            O10.013  complicating pregnancy, third trimester  (procardia  and labetalol )  LR NIPS, neg Horizon, neg AFP  [redacted] weeks gestation of pregnancy                 Z3A.30 ---------------------------------------------------------------------- Fetal Evaluation  Num Of Fetuses:         1  Fetal Heart Rate(bpm):  129  Cardiac Activity:       Observed  Presentation:           Cephalic  Placenta:               Posterior  P. Cord Insertion:      Previously seen  Amniotic Fluid  AFI FV:      Within normal limits  AFI Sum(cm)     %Tile       Largest Pocket(cm)  19.5            75          5.2  RUQ(cm)       RLQ(cm)       LUQ(cm)        LLQ(cm)  5.2           4.6           4.7            5 ---------------------------------------------------------------------- Biophysical Evaluation  Amniotic F.V:   Pocket => 2 cm             F. Tone:        Observed  F. Movement:    Observed                   Score:          8/8  F. Breathing:   Observed ---------------------------------------------------------------------- OB History  Blood Type:   A+  Gravidity:    2          SAB:   1  Living:       0 ---------------------------------------------------------------------- Gestational Age  Best:          30w 6d     Det. By:  Early Ultrasound         EDD:   06/01/24                                      (10/26/23) ---------------------------------------------------------------------- Anatomy  Cranium:               Appears normal         Stomach:                Appears normal, left  sided  Thoracic:              Appears normal         Kidneys:                Appear normal  Diaphragm:             Appears normal         Bladder:                Appears normal ---------------------------------------------------------------------- Doppler - Fetal Vessels  Umbilical Artery   S/D     %tile      RI    %tile      PI    %tile     PSV    ADFV    RDFV                                                     (cm/s)    4.5   > 97.5     0.8   > 97.5     1.5   > 97.5     35.7     Yes      No  ---------------------------------------------------------------------- Cervix Uterus Adnexa  Cervix  Not visualized (advanced GA >24wks)  Uterus  No abnormality visualized.  Right Ovary  Not visualized.  Left Ovary  Not visualized.  Cul De Sac  No free fluid seen.  Adnexa  No abnormality visualized ---------------------------------------------------------------------- Comments  This patient has been hospitalized due to preeclampsia with  severe features.  IUGR was noted on prior ultrasound exam.  A BPP performed today was 8/8.  The total AFI was 19.5 cm.  Vertex presentation.  Doppler studies of the umbilical arteries continues to show an  elevated S/D ratio of 4.5.  An occasional episode of absent  end-diastolic flow was noted.  There were no signs of  reversed end-diastolic flow noted today.  She should continue the plan for inpatient management,  twice weekly umbilical artery Doppler studies and BPP's, and  twice daily NSTs.  Delivery is planned for 34 weeks. ----------------------------------------------------------------------                   Steffan Keys, MD Electronically Signed Final Report   03/29/2024 05:29 pm ----------------------------------------------------------------------   US  MFM OB FOLLOW UP Result Date: 03/25/2024 ----------------------------------------------------------------------  OBSTETRICS REPORT                       (Signed Final 03/25/2024 05:31 pm) ---------------------------------------------------------------------- Patient Info  ID #:       [id number]                          D.O.B.:  01-20-1991 (32 yrs)(F)  Name:       Summer Campbell                 Visit Date: 03/25/2024 10:29 am ---------------------------------------------------------------------- Performed By  Attending:        Nathanel Fetters      Ref. Address:     [address]                    [place]  [place], [place]                                                              [address]  Performed By:     Burnard LITTIE Custard          Secondary Phy.:   [facility]                    RDMS                                                             Care  Referred By:      BEBE                [facility]ICKENS MD                               [facility] ---------------------------------------------------------------------- Orders  #  Description                           Code        Ordered By  1  US  MFM OB FOLLOW UP                   76816.01    CHARLIE PICKENS  2  US  MFM UA CORD DOPPLER                76820.02    CHARLIE PICKENS  3  US  MFM FETAL BPP WO NON               76819.01    CHARLIE PICKENS     STRESS ----------------------------------------------------------------------  #  Order #                     Accession #                Episode #  1  [id number]                   [id number]                 [id number]  2  [id number]                   [id number]                 [id number]  3  [id number]                   [id number]                 [id number] ---------------------------------------------------------------------- Indications  Severe preeclampsia, third trimester           O14.13  [redacted] weeks gestation of pregnancy  Z3A.30  Pre-existing diabetes, type 2, in pregnancy,   O24.112  second trimester (Insulin )  Anxiety during pregnancy, second trimester     O99.342, F41.9  (Lexapro) ---------------------------------------------------------------------- Fetal Evaluation  Num Of Fetuses:         1  Fetal Heart Rate(bpm):  133  Cardiac Activity:       Observed  Presentation:           Cephalic  Placenta:               Posterior  Amniotic Fluid  AFI FV:      Within normal limits ---------------------------------------------------------------------- Biophysical Evaluation  Amniotic F.V:   Within normal limits       F. Tone:        Observed  F. Movement:    Observed                   Score:          8/8  F. Breathing:   Observed  ---------------------------------------------------------------------- Biometry  BPD:      76.5  mm     G. Age:  30w 5d         51  %    CI:        78.66   %    70 - 86                                                          FL/HC:      19.6   %    19.2 - 21.4  HC:      272.8  mm     G. Age:  29w 5d          8  %    HC/AC:      1.10        0.99 - 1.21  AC:      248.2  mm     G. Age:  29w 0d         13  %    FL/BPD:     70.1   %    71 - 87  FL:       53.6  mm     G. Age:  28w 3d        3.5  %    FL/AC:      21.6   %    20 - 24  Est. FW:    1323  gm    2 lb 15 oz       8  % ---------------------------------------------------------------------- OB History  Blood Type:   A+  Gravidity:    2          SAB:   1  Living:       0 ---------------------------------------------------------------------- Gestational Age  U/S Today:     29w 3d                                        EDD:   06/07/24  Best:          30w 2d     Det. By:  Early Ultrasound         EDD:   06/01/24                                      (  10/26/23) ---------------------------------------------------------------------- Anatomy  Cranium:               Previously seen        Aortic Arch:            Not well visualized  Cavum:                 Previously seen        Ductal Arch:            Previously seen  Ventricles:            Previously seen        Diaphragm:              Appears normal  Choroid Plexus:        Previously seen        Stomach:                Appears normal, left                                                                        sided  Cerebellum:            Not well visualized    Abdomen:                Previously seen  Posterior Fossa:       Not well visualized    Abdominal Wall:         Not well visualized  Face:                  Not well visualized    Cord Vessels:           Not well visualized  Lips:                  Not well visualized    Kidneys:                Appear normal  Thoracic:              Previously seen        Bladder:                 Appears normal  Heart:                 Not well visualized    Spine:                  Previously seen  RVOT:                  Not well visualized    Upper Extremities:      Previously seen  LVOT:                  Not well visualized    Lower Extremities:      Previously seen ---------------------------------------------------------------------- Doppler - Fetal Vessels  Umbilical Artery  ADFV    RDFV                                                                Yes      No ---------------------------------------------------------------------- Cervix Uterus Adnexa  Cervix  Not visualized (advanced GA >24wks)  Adnexa  No abnormality visualized ---------------------------------------------------------------------- Impression  Follow up growth due to preeclampsia with severe features  Normal interval growth with measurements consistent with  fetal growth restriction  Good fetal movement and amniotic fluid volume  BPP 8/8  The UAD are intermittently absent with >50% demonstratng  AEDF but no evidence of REDF.  I discussed this case with Dr. Zina. recommend twice weekly  UAD/BPP with two times daily NST.  Repeat growth in 3 weeks  Plan for delivery at 34 week. ----------------------------------------------------------------------               Nathanel Fetters, MD Electronically Signed Final Report   03/25/2024 05:31 pm ----------------------------------------------------------------------   US  MFM UA CORD DOPPLER Result Date: 03/25/2024 ----------------------------------------------------------------------  OBSTETRICS REPORT                       (Signed Final 03/25/2024 05:31 pm) ---------------------------------------------------------------------- Patient Info  ID #:       [id number]                          D.O.B.:  1991-01-23 (32 yrs)(F)  Name:       Summer Campbell                 Visit Date: 03/25/2024 10:29 am  ---------------------------------------------------------------------- Performed By  Attending:        Nathanel Fetters      Ref. Address:     [address]                    [place]                                                             [place], [place]                                                             [address]  Performed By:     Burnard CROME Custard          Secondary Phy.:   [facility]                    RDMS                                                             Care  Referred  By:      CHARLIE                [facility]ICKENS MD                               [facility] ---------------------------------------------------------------------- Orders  #  Description                           Code        Ordered By  1  US  MFM OB FOLLOW UP                   [id number]    CHARLIE PICKENS  2  US  MFM UA CORD DOPPLER                76820.02    CHARLIE PICKENS  3  US  MFM FETAL BPP WO NON               76819.01    CHARLIE PICKENS     STRESS ----------------------------------------------------------------------  #  Order #                     Accession #                Episode #  1  [id number]                   [id number]                 [id number]  2  [id number]                   [id number]                 [id number]  3  [id number]                   [id number]                 [id number] ---------------------------------------------------------------------- Indications  Severe preeclampsia, third trimester           O14.13  [redacted] weeks gestation of pregnancy                Z3A.30  Pre-existing diabetes, type 2, in pregnancy,   O24.112  second trimester (Insulin )  Anxiety during pregnancy, second trimester     O99.342, F41.9  (Lexapro) ---------------------------------------------------------------------- Fetal Evaluation  Num Of Fetuses:         1  Fetal Heart Rate(bpm):  133  Cardiac Activity:       Observed  Presentation:           Cephalic  Placenta:               Posterior   Amniotic Fluid  AFI FV:      Within normal limits ---------------------------------------------------------------------- Biophysical Evaluation  Amniotic F.V:   Within normal limits       F. Tone:        Observed  F. Movement:    Observed                   Score:          8/8  F. Breathing:  Observed ---------------------------------------------------------------------- Biometry  BPD:      76.5  mm     G. Age:  30w 5d         51  %    CI:        78.66   %    70 - 86                                                          FL/HC:      19.6   %    19.2 - 21.4  HC:      272.8  mm     G. Age:  29w 5d          8  %    HC/AC:      1.10        0.99 - 1.21  AC:      248.2  mm     G. Age:  29w 0d         13  %    FL/BPD:     70.1   %    71 - 87  FL:       53.6  mm     G. Age:  28w 3d        3.5  %    FL/AC:      21.6   %    20 - 24  Est. FW:    1323  gm    2 lb 15 oz       8  % ---------------------------------------------------------------------- OB History  Blood Type:   A+  Gravidity:    2          SAB:   1  Living:       0 ---------------------------------------------------------------------- Gestational Age  U/S Today:     29w 3d                                        EDD:   06/07/24  Best:          30w 2d     Det. By:  Early Ultrasound         EDD:   06/01/24                                      (10/26/23) ---------------------------------------------------------------------- Anatomy  Cranium:               Previously seen        Aortic Arch:            Not well visualized  Cavum:                 Previously seen        Ductal Arch:            Previously seen  Ventricles:            Previously seen        Diaphragm:              Appears normal  Choroid Plexus:        Previously seen  Stomach:                Appears normal, left                                                                        sided  Cerebellum:            Not well visualized    Abdomen:                Previously seen  Posterior Fossa:        Not well visualized    Abdominal Wall:         Not well visualized  Face:                  Not well visualized    Cord Vessels:           Not well visualized  Lips:                  Not well visualized    Kidneys:                Appear normal  Thoracic:              Previously seen        Bladder:                Appears normal  Heart:                 Not well visualized    Spine:                  Previously seen  RVOT:                  Not well visualized    Upper Extremities:      Previously seen  LVOT:                  Not well visualized    Lower Extremities:      Previously seen ---------------------------------------------------------------------- Doppler - Fetal Vessels  Umbilical Artery                                                              ADFV    RDFV                                                                Yes      No ---------------------------------------------------------------------- Cervix Uterus Adnexa  Cervix  Not visualized (advanced GA >24wks)  Adnexa  No abnormality visualized ---------------------------------------------------------------------- Impression  Follow up growth due to preeclampsia with severe features  Normal interval growth with measurements consistent with  fetal growth restriction  Good fetal movement and amniotic fluid volume  BPP 8/8  The UAD are intermittently absent with >50% demonstratng  AEDF but no evidence of REDF.  I discussed this case with Dr. Zina. recommend twice weekly  UAD/BPP with two times daily NST.  Repeat growth in 3 weeks  Plan for delivery at 34 week. ----------------------------------------------------------------------               Nathanel Fetters, MD Electronically Signed Final Report   03/25/2024 05:31 pm ----------------------------------------------------------------------   US  MFM FETAL BPP WO NON STRESS Result Date: 03/25/2024 ----------------------------------------------------------------------  OBSTETRICS REPORT                        (Signed Final 03/25/2024 05:31 pm) ---------------------------------------------------------------------- Patient Info  ID #:       [id number]                          D.O.B.:  August 13, 1990 (32 yrs)(F)  Name:       Summer Campbell                 Visit Date: 03/25/2024 10:29 am ---------------------------------------------------------------------- Performed By  Attending:        Nathanel Fetters      Ref. Address:     [address]                    MD                                                             [place], [place]                                                             [address]  Performed By:     Burnard CROME Custard          Secondary Phy.:   [facility]                    [facility]                                                             [facility]  Referred By:      BEBE                [facility] ---------------------------------------------------------------------- Orders  #  Description                           Code        Ordered By  1  US  MFM OB FOLLOW UP  23183.98    CHARLIE PICKENS  2  US  MFM UA CORD DOPPLER                76820.02    CHARLIE PICKENS  3  US  MFM FETAL BPP WO NON               76819.01    CHARLIE PICKENS     STRESS ----------------------------------------------------------------------  #  Order #                     Accession #                Episode #  1  [id number]                   [id number]                 [id number]  2  [id number]                   [id number]                 [id number]  3  [id number]                   [id number]                 [id number] ---------------------------------------------------------------------- Indications  Severe preeclampsia, third trimester           O14.13  [redacted] weeks gestation of pregnancy                Z3A.30  Pre-existing diabetes, type 2, in pregnancy,   O24.112  second trimester (Insulin )  Anxiety during pregnancy, second trimester      O99.342, F41.9  (Lexapro) ---------------------------------------------------------------------- Fetal Evaluation  Num Of Fetuses:         1  Fetal Heart Rate(bpm):  133  Cardiac Activity:       Observed  Presentation:           Cephalic  Placenta:               Posterior  Amniotic Fluid  AFI FV:      Within normal limits ---------------------------------------------------------------------- Biophysical Evaluation  Amniotic F.V:   Within normal limits       F. Tone:        Observed  F. Movement:    Observed                   Score:          8/8  F. Breathing:   Observed ---------------------------------------------------------------------- Biometry  BPD:      76.5  mm     G. Age:  30w 5d         51  %    CI:        78.66   %    70 - 86                                                          FL/HC:      19.6   %    19.2 - 21.4  HC:      272.8  mm     G. Age:  29w 5d  8  %    HC/AC:      1.10        0.99 - 1.21  AC:      248.2  mm     G. Age:  29w 0d         13  %    FL/BPD:     70.1   %    71 - 87  FL:       53.6  mm     G. Age:  28w 3d        3.5  %    FL/AC:      21.6   %    20 - 24  Est. FW:    1323  gm    2 lb 15 oz       8  % ---------------------------------------------------------------------- OB History  Blood Type:   A+  Gravidity:    2          SAB:   1  Living:       0 ---------------------------------------------------------------------- Gestational Age  U/S Today:     29w 3d                                        EDD:   06/07/24  Best:          30w 2d     Det. By:  Early Ultrasound         EDD:   06/01/24                                      (10/26/23) ---------------------------------------------------------------------- Anatomy  Cranium:               Previously seen        Aortic Arch:            Not well visualized  Cavum:                 Previously seen        Ductal Arch:            Previously seen  Ventricles:            Previously seen        Diaphragm:              Appears normal   Choroid Plexus:        Previously seen        Stomach:                Appears normal, left                                                                        sided  Cerebellum:            Not well visualized    Abdomen:                Previously seen  Posterior Fossa:       Not well visualized    Abdominal Wall:  Not well visualized  Face:                  Not well visualized    Cord Vessels:           Not well visualized  Lips:                  Not well visualized    Kidneys:                Appear normal  Thoracic:              Previously seen        Bladder:                Appears normal  Heart:                 Not well visualized    Spine:                  Previously seen  RVOT:                  Not well visualized    Upper Extremities:      Previously seen  LVOT:                  Not well visualized    Lower Extremities:      Previously seen ---------------------------------------------------------------------- Doppler - Fetal Vessels  Umbilical Artery                                                              ADFV    RDFV                                                                Yes      No ---------------------------------------------------------------------- Cervix Uterus Adnexa  Cervix  Not visualized (advanced GA >24wks)  Adnexa  No abnormality visualized ---------------------------------------------------------------------- Impression  Follow up growth due to preeclampsia with severe features  Normal interval growth with measurements consistent with  fetal growth restriction  Good fetal movement and amniotic fluid volume  BPP 8/8  The UAD are intermittently absent with >50% demonstratng  AEDF but no evidence of REDF.  I discussed this case with Dr. Zina. recommend twice weekly  UAD/BPP with two times daily NST.  Repeat growth in 3 weeks  Plan for delivery at 34 week. ----------------------------------------------------------------------               Nathanel Fetters, MD Electronically  Signed Final Report   03/25/2024 05:31 pm ----------------------------------------------------------------------     Medications:  Scheduled  aspirin   162 mg Oral Daily   escitalopram  20 mg Oral Daily   insulin  aspart  0-15 Units Subcutaneous TID PC   insulin  aspart  5 Units Subcutaneous TID WC   insulin  glargine-yfgn  10 Units Subcutaneous Daily   labetalol   800 mg Oral TID   metFORMIN   1,000 mg Oral BID WC   NIFEdipine   60 mg Oral BID   pantoprazole  20 mg Oral Daily   prenatal  multivitamin  1 tablet Oral Q1200   sodium chloride  flush  3 mL Intravenous Q12H   valACYclovir  500 mg Oral BID   I have reviewed the patient's current medications.   Glenys GORMAN Birk, MD 04/10/2024,7:48 AM

## 2024-04-10 NOTE — Assessment & Plan Note (Signed)
 Continue Valtrex for prophylaxis

## 2024-04-10 NOTE — Plan of Care (Signed)
  Problem: Education: Goal: Knowledge of disease or condition will improve Outcome: Progressing Goal: Knowledge of the prescribed therapeutic regimen will improve Outcome: Progressing   Problem: Fluid Volume: Goal: Peripheral tissue perfusion will improve Outcome: Progressing   Problem: Clinical Measurements: Goal: Complications related to disease process, condition or treatment will be avoided or minimized Outcome: Progressing   Problem: Health Behavior/Discharge Planning: Goal: Ability to manage health-related needs will improve Outcome: Progressing   Problem: Clinical Measurements: Goal: Ability to maintain clinical measurements within normal limits will improve Outcome: Progressing Goal: Will remain free from infection Outcome: Progressing Goal: Diagnostic test results will improve Outcome: Progressing Goal: Respiratory complications will improve Outcome: Progressing Goal: Cardiovascular complication will be avoided Outcome: Progressing   Problem: Activity: Goal: Risk for activity intolerance will decrease Outcome: Progressing   Problem: Nutrition: Goal: Adequate nutrition will be maintained Outcome: Progressing   Problem: Coping: Goal: Level of anxiety will decrease Outcome: Progressing   Problem: Elimination: Goal: Will not experience complications related to bowel motility Outcome: Progressing Goal: Will not experience complications related to urinary retention Outcome: Progressing   Problem: Pain Managment: Goal: General experience of comfort will improve and/or be controlled Outcome: Progressing   Problem: Safety: Goal: Ability to remain free from injury will improve Outcome: Progressing   Problem: Skin Integrity: Goal: Risk for impaired skin integrity will decrease Outcome: Progressing   Problem: Education: Goal: Knowledge of disease or condition will improve Outcome: Progressing Goal: Knowledge of the prescribed therapeutic regimen will  improve Outcome: Progressing Goal: Individualized Educational Video(s) Outcome: Progressing   Problem: Clinical Measurements: Goal: Complications related to the disease process, condition or treatment will be avoided or minimized Outcome: Progressing   Problem: Education: Goal: Ability to describe self-care measures that may prevent or decrease complications (Diabetes Survival Skills Education) will improve Outcome: Progressing Goal: Individualized Educational Video(s) Outcome: Progressing   Problem: Coping: Goal: Ability to adjust to condition or change in health will improve Outcome: Progressing   Problem: Fluid Volume: Goal: Ability to maintain a balanced intake and output will improve Outcome: Progressing   Problem: Health Behavior/Discharge Planning: Goal: Ability to identify and utilize available resources and services will improve Outcome: Progressing Goal: Ability to manage health-related needs will improve Outcome: Progressing   Problem: Metabolic: Goal: Ability to maintain appropriate glucose levels will improve Outcome: Progressing   Problem: Nutritional: Goal: Maintenance of adequate nutrition will improve Outcome: Progressing Goal: Progress toward achieving an optimal weight will improve Outcome: Progressing   Problem: Skin Integrity: Goal: Risk for impaired skin integrity will decrease Outcome: Progressing   Problem: Tissue Perfusion: Goal: Adequacy of tissue perfusion will improve Outcome: Progressing

## 2024-04-11 LAB — COMPREHENSIVE METABOLIC PANEL WITH GFR
ALT: 25 U/L (ref 0–44)
AST: 17 U/L (ref 15–41)
Albumin: 2 g/dL — ABNORMAL LOW (ref 3.5–5.0)
Alkaline Phosphatase: 60 U/L (ref 38–126)
Anion gap: 8 (ref 5–15)
BUN: 18 mg/dL (ref 6–20)
CO2: 22 mmol/L (ref 22–32)
Calcium: 8.7 mg/dL — ABNORMAL LOW (ref 8.9–10.3)
Chloride: 105 mmol/L (ref 98–111)
Creatinine, Ser: 0.64 mg/dL (ref 0.44–1.00)
GFR, Estimated: >60 mL/min (ref >60)
Glucose, Bld: 137 mg/dL — ABNORMAL HIGH (ref 70–99)
Potassium: 4.3 mmol/L (ref 3.5–5.1)
Sodium: 135 mmol/L (ref 135–145)
Total Bilirubin: 0.2 mg/dL (ref 0.0–1.2)
Total Protein: 5.1 g/dL — ABNORMAL LOW (ref 6.5–8.1)

## 2024-04-11 LAB — TYPE AND SCREEN
ABO/RH(D): A POS
Antibody Screen: NEGATIVE

## 2024-04-11 LAB — GLUCOSE, CAPILLARY
Glucose-Capillary: 86 mg/dL (ref 70–99)
Glucose-Capillary: 155 mg/dL — ABNORMAL HIGH (ref 70–99)
Glucose-Capillary: 103 mg/dL — ABNORMAL HIGH (ref 70–99)

## 2024-04-11 LAB — CBC
HCT: 31.1 % — ABNORMAL LOW (ref 36.0–46.0)
Hemoglobin: 10.6 g/dL — ABNORMAL LOW (ref 12.0–15.0)
MCH: 29.5 pg (ref 26.0–34.0)
MCHC: 34.1 g/dL (ref 30.0–36.0)
MCV: 86.6 fL (ref 80.0–100.0)
Platelets: 214 K/uL (ref 150–400)
RBC: 3.59 MIL/uL — ABNORMAL LOW (ref 3.87–5.11)
RDW: 13.4 % (ref 11.5–15.5)
WBC: 14.8 K/uL — ABNORMAL HIGH (ref 4.0–10.5)
nRBC: 0 % (ref 0.0–0.2)

## 2024-04-11 NOTE — Assessment & Plan Note (Signed)
 BPs remain in the mild range currently maxed out on 2 medications Last labs were within normal limits yesterday and repeat to 72 hours Plan for delivery around 34 weeks or sooner if BPs become poorly controlled.

## 2024-04-11 NOTE — Progress Notes (Signed)
 Patient ID: [name], female   DOB: 1991-05-18, 33 y.o.   MRN: [id number] FACULTY PRACTICE ANTEPARTUM(COMPREHENSIVE) NOTE  Summer Campbell is a 33 y.o. G2P0010 at [redacted]w[redacted]d by best clinical estimate who is admitted for superimposed severe preeclampsia on chronic hypertension and new fetal growth restriction.   Fetal presentation is cephalic. Length of Stay:  18  Days  ASSESSMENT: Principal Problem:   Chronic hypertension with superimposed preeclampsia Active Problems:   Herpes simplex infection of perianal skin   Preexisting diabetes complicating pregnancy, antepartum   Severe pre-eclampsia   IUGR (intrauterine growth restriction) affecting care of mother   PLAN: Chronic hypertension with superimposed preeclampsia Currently on Procardia  60 twice daily Continue labetalol  800 3 times daily Status post magnesium x 1 with plan for repeat peripartum   IUGR (intrauterine growth restriction) affecting care of mother Estimated fetal weight at 8 percentile Absent end-diastolic flow Normal AFI Last BPP 8 of 8 Fetal heart tracing is category 1 Plan for follow-up growth on 11/13  Herpes simplex infection of perianal skin Continue Valtrex for prophylaxis  Preexisting diabetes complicating pregnancy, antepartum Currently on Semglee  10 nightly and short acting 5 with meals Continues on metformin  twice daily CBGs are normal  Severe pre-eclampsia BPs remain in the mild range currently maxed out on 2 medications Last labs were within normal limits today Plan for delivery around 34 weeks or sooner if BPs become poorly controlled.   Subjective: Feels well today Patient reports the fetal movement as active. Patient reports uterine contraction  activity as none. Patient reports  vaginal bleeding as none. Patient describes fluid per vagina as None.  Vitals:  Blood pressure (!) 151/76, pulse 80, temperature 98 F (36.7 C), temperature source Oral, resp. rate 16, height 5' 4 (1.626 m),  weight (!) 152.5 kg, SpO2 97%. Physical Examination:  General appearance - alert, well appearing, and in no distress Chest - normal effort Abdomen - gravid, non-tender Fundal Height:  size less than dates Extremities: edema 2+  Membranes:intact  Fetal Monitoring:  Baseline: 130 bpm, Variability: Good {> 6 bpm), Accelerations: Reactive, and Decelerations: Absent  Labs:  Results for orders placed or performed during the hospital encounter of 03/24/24 (from the past 24 hours)  Glucose, capillary   Collection Time: 04/10/24  8:02 AM  Result Value Ref Range   Glucose-Capillary 81 70 - 99 mg/dL  Glucose, capillary   Collection Time: 04/10/24  9:11 PM  Result Value Ref Range   Glucose-Capillary 73 70 - 99 mg/dL  Type and screen [facility]   Collection Time: 04/11/24  4:37 AM  Result Value Ref Range   ABO/RH(D) A POS    Antibody Screen NEG    Sample Expiration      04/14/2024,2359 Performed at [facility], [address], [place], [place] [address]   CBC   Collection Time: 04/11/24  4:39 AM  Result Value Ref Range   WBC 14.8 (H) 4.0 - 10.5 K/uL   RBC 3.59 (L) 3.87 - 5.11 MIL/uL   Hemoglobin 10.6 (L) 12.0 - 15.0 g/dL   HCT 68.8 (L) 63.9 - 53.9 %   MCV 86.6 80.0 - 100.0 fL   MCH 29.5 26.0 - 34.0 pg   MCHC 34.1 30.0 - 36.0 g/dL   RDW 86.5 88.4 - 84.4 %   Platelets 214 150 - 400 K/uL   nRBC 0.0 0.0 - 0.2 %  Comprehensive metabolic panel   Collection Time: 04/11/24  4:39 AM  Result Value  Ref Range   Sodium 135 135 - 145 mmol/L   Potassium 4.3 3.5 - 5.1 mmol/L   Chloride 105 98 - 111 mmol/L   CO2 22 22 - 32 mmol/L   Glucose, Bld 137 (H) 70 - 99 mg/dL   BUN 18 6 - 20 mg/dL   Creatinine, Ser 9.35 0.44 - 1.00 mg/dL   Calcium 8.7 (L) 8.9 - 10.3 mg/dL   Total Protein 5.1 (L) 6.5 - 8.1 g/dL   Albumin 2.0 (L) 3.5 - 5.0 g/dL   AST 17 15 - 41 U/L   ALT 25 0 - 44 U/L   Alkaline Phosphatase 60 38 - 126 U/L   Total Bilirubin 0.2 0.0 - 1.2 mg/dL   GFR,  Estimated >39 >39 mL/min   Anion gap 8 5 - 15  Glucose, capillary   Collection Time: 04/11/24  7:32 AM  Result Value Ref Range   Glucose-Capillary 86 70 - 99 mg/dL    Imaging Studies:    US  MFM UA CORD DOPPLER Result Date: 04/10/2024 ----------------------------------------------------------------------  OBSTETRICS REPORT                        (Signed Final 04/10/2024 08:20 pm) ---------------------------------------------------------------------- Patient Info  ID #:       [id number]                          D.O.B.:  06/03/1991 (32 yrs)(F)  Name:       Summer Campbell                 Visit Date: 04/09/2024 08:10 am ---------------------------------------------------------------------- Performed By  Attending:        Nathanel Fetters      Ref. Address:      [address]                    [place]                                                              [place], [place]                                                              [address]  Performed By:     Metta Margaretha Schlein Phy.:    [facility]                    RDMS                                                              Care  Referred By:      CHARLIE                [facility]                                [facility] ---------------------------------------------------------------------- Orders  #  Description                           Code        Ordered By  1  US  MFM UA CORD DOPPLER                76820.02    UGONNA ANYANWU  2  US  MFM FETAL BPP WO NON               76819.01    UGONNA ANYANWU     STRESS ----------------------------------------------------------------------  #  Order #                     Accession #                Episode #  1  [id number]                   [id number]                 [id number]  2  [id number]                   [id number]                 [id number] ---------------------------------------------------------------------- Indications  Maternal care for  known or suspected poor       O36.5930  fetal growth, third trimester, single or  unspecified fetus IUGR  Severe preeclampsia, third trimester            O14.13  [redacted] weeks gestation of pregnancy                 Z3A.32  Pre-existing diabetes, type 2, in pregnancy,    O24.113  third trimester (insulin )  Pre-existing essential hypertension             O10.013  complicating pregnancy, third trimester  (procardia  and labetalol )  Obesity complicating pregnancy, third           O99.213  trimester (BMI 51.7)  Anxiety during pregnancy, third trimester       O99.343, F41.9 ---------------------------------------------------------------------- Fetal Evaluation  Num Of Fetuses:          1  Fetal Heart Rate(bpm):   132  Cardiac Activity:        Observed  Presentation:            Cephalic  Placenta:                Posterior  P. Cord Insertion:       Previously seen  Amniotic Fluid  AFI FV:      Within normal limits  AFI Sum(cm)     %Tile       Largest Pocket(cm)  15              53          5.2  RUQ(cm)       RLQ(cm)       LUQ(cm)        LLQ(cm)  1.2  5.2           3.8            4.8 ---------------------------------------------------------------------- Biophysical Evaluation  Amniotic F.V:   Pocket => 2 cm             F. Tone:         Observed  F. Movement:    Observed                   Score:           8/8  F. Breathing:   Observed ---------------------------------------------------------------------- OB History  Blood Type:   A+  Gravidity:    2          SAB:   1  Living:       0 ---------------------------------------------------------------------- Gestational Age  Best:          32w 3d     Det. By:  Early Ultrasound         EDD:   06/01/24                                      (10/26/23) ---------------------------------------------------------------------- Doppler - Fetal Vessels  Umbilical Artery    S/D    %tile      RI    %tile      PI    %tile     PSV    ADFV    RDFV                                                      (cm/s)    7.4   > 97.5     0.9   > 97.5     1.8   > 97.5     28.4     Yes      No ---------------------------------------------------------------------- Impression  Antenatal testing due to IUGR with an EFW 8th%  Biophysical profile 8/8 with good fetal movement and  amniotic fluid volume  UA Dopplers are elevated with intermittent absent EDF there  is no evidence of REDF ---------------------------------------------------------------------- Recommendations  Continue 2 x daily NST  Twice weekly UAD with BPP  Repet growth this week  Consider delivery for non-reassuring NST  Delivery by 34 weeks ----------------------------------------------------------------------              Nathanel Fetters, MD Electronically Signed Final Report   04/10/2024 08:20 pm ----------------------------------------------------------------------   US  MFM FETAL BPP WO NON STRESS Result Date: 04/10/2024 ----------------------------------------------------------------------  OBSTETRICS REPORT                        (Signed Final 04/10/2024 08:20 pm) ---------------------------------------------------------------------- Patient Info  ID #:       [id number]                          D.O.B.:  30-Jan-1991 (32 yrs)(F)  Name:       Summer Campbell                 Visit Date: 04/09/2024 08:10 am ---------------------------------------------------------------------- Performed By  Attending:        Nathanel Fetters      Ref. Address:  [address]                    [place]                                                              [place], [place]                                                              [address]  Performed By:     Metta Ar          Secondary Phy.:    [facility]                    RDMS                                                              Care  Referred By:      BEBE                [facility] [place]                                [facility]  ---------------------------------------------------------------------- Orders  #  Description                           Code        Ordered By  1  US  MFM UA CORD DOPPLER                76820.02    UGONNA ANYANWU  2  US  MFM FETAL BPP WO NON               76819.01    UGONNA ANYANWU     STRESS ----------------------------------------------------------------------  #  Order #                     Accession #                Episode #  1  [id number]                   [id number]                 [id number]  2  [id number]                   [id number]                 [id number] ---------------------------------------------------------------------- Indications  Maternal care for known or suspected poor       O36.5930  fetal growth, third trimester, single  or  unspecified fetus IUGR  Severe preeclampsia, third trimester            O14.13  [redacted] weeks gestation of pregnancy                 Z3A.32  Pre-existing diabetes, type 2, in pregnancy,    O24.113  third trimester (insulin )  Pre-existing essential hypertension             O10.013  complicating pregnancy, third trimester  (procardia  and labetalol )  Obesity complicating pregnancy, third           O99.213  trimester (BMI 51.7)  Anxiety during pregnancy, third trimester       O99.343, F41.9 ---------------------------------------------------------------------- Fetal Evaluation  Num Of Fetuses:          1  Fetal Heart Rate(bpm):   132  Cardiac Activity:        Observed  Presentation:            Cephalic  Placenta:                Posterior  P. Cord Insertion:       Previously seen  Amniotic Fluid  AFI FV:      Within normal limits  AFI Sum(cm)     %Tile       Largest Pocket(cm)  15              53          5.2  RUQ(cm)       RLQ(cm)       LUQ(cm)        LLQ(cm)  1.2           5.2           3.8            4.8 ---------------------------------------------------------------------- Biophysical Evaluation  Amniotic F.V:   Pocket => 2 cm             F. Tone:         Observed  F. Movement:     Observed                   Score:           8/8  F. Breathing:   Observed ---------------------------------------------------------------------- OB History  Blood Type:   A+  Gravidity:    2          SAB:   1  Living:       0 ---------------------------------------------------------------------- Gestational Age  Best:          32w 3d     Det. By:  Early Ultrasound         EDD:   06/01/24                                      (10/26/23) ---------------------------------------------------------------------- Doppler - Fetal Vessels  Umbilical Artery    S/D    %tile      RI    %tile      PI    %tile     PSV    ADFV    RDFV                                                     (  cm/s)    7.4   > 97.5     0.9   > 97.5     1.8   > 97.5     28.4     Yes      No ---------------------------------------------------------------------- Impression  Antenatal testing due to IUGR with an EFW 8th%  Biophysical profile 8/8 with good fetal movement and  amniotic fluid volume  UA Dopplers are elevated with intermittent absent EDF there  is no evidence of REDF ---------------------------------------------------------------------- Recommendations  Continue 2 x daily NST  Twice weekly UAD with BPP  Repet growth this week  Consider delivery for non-reassuring NST  Delivery by 34 weeks ----------------------------------------------------------------------              Nathanel Fetters, MD Electronically Signed Final Report   04/10/2024 08:20 pm ----------------------------------------------------------------------      Medications:  Scheduled  aspirin   162 mg Oral Daily   escitalopram  20 mg Oral Daily   insulin  aspart  0-15 Units Subcutaneous TID PC   insulin  aspart  5 Units Subcutaneous TID WC   insulin  glargine-yfgn  10 Units Subcutaneous Daily   labetalol   800 mg Oral TID   metFORMIN   1,000 mg Oral BID WC   NIFEdipine   60 mg Oral BID   pantoprazole  20 mg Oral Daily   prenatal multivitamin  1 tablet Oral Q1200   sodium  chloride flush  3 mL Intravenous Q12H   valACYclovir  500 mg Oral BID   I have reviewed the patient's current medications.   Glenys GORMAN Birk, MD 04/11/2024,7:43 AM

## 2024-04-11 NOTE — Plan of Care (Signed)
  Problem: Education: Goal: Knowledge of disease or condition will improve Outcome: Progressing Goal: Knowledge of the prescribed therapeutic regimen will improve Outcome: Progressing   Problem: Fluid Volume: Goal: Peripheral tissue perfusion will improve Outcome: Progressing   Problem: Clinical Measurements: Goal: Complications related to disease process, condition or treatment will be avoided or minimized Outcome: Progressing   Problem: Health Behavior/Discharge Planning: Goal: Ability to manage health-related needs will improve Outcome: Progressing   Problem: Clinical Measurements: Goal: Ability to maintain clinical measurements within normal limits will improve Outcome: Progressing Goal: Will remain free from infection Outcome: Progressing Goal: Diagnostic test results will improve Outcome: Progressing Goal: Respiratory complications will improve Outcome: Progressing Goal: Cardiovascular complication will be avoided Outcome: Progressing   Problem: Activity: Goal: Risk for activity intolerance will decrease Outcome: Progressing   Problem: Pain Managment: Goal: General experience of comfort will improve and/or be controlled Outcome: Progressing   Problem: Safety: Goal: Ability to remain free from injury will improve Outcome: Progressing   Problem: Skin Integrity: Goal: Risk for impaired skin integrity will decrease Outcome: Progressing   Problem: Education: Goal: Knowledge of disease or condition will improve Outcome: Progressing Goal: Knowledge of the prescribed therapeutic regimen will improve Outcome: Progressing Goal: Individualized Educational Video(s) Outcome: Progressing   Problem: Clinical Measurements: Goal: Complications related to the disease process, condition or treatment will be avoided or minimized Outcome: Progressing   Problem: Education: Goal: Ability to describe self-care measures that may prevent or decrease complications (Diabetes  Survival Skills Education) will improve Outcome: Progressing Goal: Individualized Educational Video(s) Outcome: Progressing   Problem: Coping: Goal: Ability to adjust to condition or change in health will improve Outcome: Progressing   Problem: Fluid Volume: Goal: Ability to maintain a balanced intake and output will improve Outcome: Progressing   Problem: Health Behavior/Discharge Planning: Goal: Ability to identify and utilize available resources and services will improve Outcome: Progressing Goal: Ability to manage health-related needs will improve Outcome: Progressing   Problem: Metabolic: Goal: Ability to maintain appropriate glucose levels will improve Outcome: Progressing   Problem: Nutritional: Goal: Maintenance of adequate nutrition will improve Outcome: Progressing Goal: Progress toward achieving an optimal weight will improve Outcome: Progressing   Problem: Skin Integrity: Goal: Risk for impaired skin integrity will decrease Outcome: Progressing   Problem: Tissue Perfusion: Goal: Adequacy of tissue perfusion will improve Outcome: Progressing

## 2024-04-12 ENCOUNTER — Inpatient Hospital Stay (HOSPITAL_COMMUNITY)

## 2024-04-12 DIAGNOSIS — O1413 Severe pre-eclampsia, third trimester: Secondary | ICD-10-CM

## 2024-04-12 DIAGNOSIS — O365930 Maternal care for other known or suspected poor fetal growth, third trimester, not applicable or unspecified: Secondary | ICD-10-CM | POA: Diagnosis not present

## 2024-04-12 DIAGNOSIS — E119 Type 2 diabetes mellitus without complications: Secondary | ICD-10-CM

## 2024-04-12 DIAGNOSIS — O10013 Pre-existing essential hypertension complicating pregnancy, third trimester: Secondary | ICD-10-CM | POA: Diagnosis not present

## 2024-04-12 DIAGNOSIS — Z3A32 32 weeks gestation of pregnancy: Secondary | ICD-10-CM

## 2024-04-12 DIAGNOSIS — O113 Pre-existing hypertension with pre-eclampsia, third trimester: Secondary | ICD-10-CM | POA: Diagnosis not present

## 2024-04-12 DIAGNOSIS — O24113 Pre-existing diabetes mellitus, type 2, in pregnancy, third trimester: Secondary | ICD-10-CM

## 2024-04-12 DIAGNOSIS — Z794 Long term (current) use of insulin: Secondary | ICD-10-CM

## 2024-04-12 LAB — GLUCOSE, CAPILLARY
Glucose-Capillary: 92 mg/dL (ref 70–99)
Glucose-Capillary: 88 mg/dL (ref 70–99)
Glucose-Capillary: 85 mg/dL (ref 70–99)
Glucose-Capillary: 165 mg/dL — ABNORMAL HIGH (ref 70–99)

## 2024-04-12 NOTE — Consult Note (Signed)
 MFM Inpatient Consult Note Patient Name: Summer Campbell  Patient MRN:   [id number]   HPI: Summer Campbell is a 33 y.o. G2P0010 at [redacted]w[redacted]d admitted severe fetal growth restriction in the setting of type 2 diabetes and chronic hypertension with superimposed preeclampsia.   The patient continues to be inpatient due to periods of absent end-diastolic flow on the umbilical artery Dopplers.  The fetal tracing has been reassuring.  The patient's blood pressure has been near severe but not in the severe range most of the time on 2 blood pressure medications that are maxed out in dosage.  Her diabetes is reasonly well-controlled on Semglee  10 units nightly with an additional 5 units of short acting insulin  at meals and metformin  twice a day.  We discussed that per the ACOG guidelines patients with absent end-diastolic flow can be delivered between 33 and 34 weeks as long as everything else is reassuring.  Given the patient's additional comorbidities including type 2 diabetes and chronic hypertension with superimposed preeclampsia with two blood pressure medications maxed out, I told the patient I favor delivery at the 33-week of pregnancy.  The patient stated that she is agreeable to this plan but would like to have 1 more ultrasound to assess the fetal wellbeing in a few days.  I discussed this is reasonable and we will use the Dopplers and the growth to help guide management..  This approach balances the risk of prematurity with the risk of intrauterine hypoxia and stillbirth.  I discussed that we will repeat an ultrasound on Wednesday to assess the umbilical artery Dopplers as well as get a fetal growth.  If the Dopplers have greater than 50% of the waveforms is absent or if the estimated fetal weight is dropped then delivery will occur this week.  The patient verbalized understanding and is agreeable to this plan.  Most recent review of her lab work shows normal ALT/AST, platelets, creatinine.  Hemoglobin is  slightly low at 10.6.  Glucose values have been mostly at goal.  Review of her ultrasound shows intermittently absent umbilical artery Dopplers, normal amniotic fluid and a BPP of 8 out of 8.  Review of Systems: A review of systems was performed and was negative except per HPI   Past Obstetrical History:  OB History  Gravida Para Term Preterm AB Living  2    1   SAB IAB Ectopic Multiple Live Births  1        # Outcome Date GA Lbr Len/2nd Weight Sex Type Anes PTL Lv  2 Current           1 SAB 02/15/14 108w0d            Birth Comments: had + pregnancy test at home and passed tissue/ blood before saw provider.     Past Gynecologic History:  Not discussed  Past Medical History:  Past Medical History:  Diagnosis Date   Bipolar affective disorder (HCC)    Depression    Diabetes mellitus without complication (HCC)    Hypertension       Past Surgical History:    Past Surgical History:  Procedure Laterality Date   ADENOIDECTOMY     TONSILLECTOMY       Family History:   family history includes Alcohol abuse in her father and mother; Bipolar disorder in her mother; Depression in her father and mother; Diabetes in her father and mother; Drug abuse in her mother; Hypertension in her father and mother; Kidney disease in her  father; Liver disease in her father; Pancreatitis in her mother; Prostate cancer in her father; Schizophrenia in her mother; Stroke in her father; Suicidality in her father.   Social History:   Social History   Socioeconomic History   Marital status: Single    Spouse name: Not on file   Number of children: Not on file   Years of education: Not on file   Highest education level: Not on file  Occupational History   Not on file  Tobacco Use   Smoking status: Never   Smokeless tobacco: Never  Vaping Use   Vaping status: Never Used  Substance and Sexual Activity   Alcohol use: Not Currently    Comment: hx of alcohol abuse but stopped drinking heavily 2018/04/28,  then socially   Drug use: Not Currently    Types: Marijuana    Comment: just after Dad passed away, not since Apr 28, 2022   Sexual activity: Yes    Birth control/protection: None  Other Topics Concern   Not on file  Social History Narrative   Not on file   Social Drivers of Health   Financial Resource Strain: Medium Risk (10/14/2023)   Received from [facility]   Overall Financial Resource Strain (CARDIA)    Difficulty of Paying Living Expenses: Somewhat hard  Food Insecurity: No Food Insecurity (03/24/2024)   Hunger Vital Sign    Worried About Running Out of Food in the Last Year: Never true    Ran Out of Food in the Last Year: Never true  Transportation Needs: No Transportation Needs (03/24/2024)   PRAPARE - Administrator, Civil Service (Medical): No    Lack of Transportation (Non-Medical): No  Recent Concern: Transportation Needs - Unmet Transportation Needs (01/01/2024)   PRAPARE - Transportation    Lack of Transportation (Medical): Yes    Lack of Transportation (Non-Medical): Yes  Physical Activity: Sufficiently Active (10/14/2023)   Received from [facility]   Exercise Vital Sign    On average, how many days per week do you engage in moderate to strenuous exercise (like a brisk walk)?: 6 days    On average, how many minutes do you engage in exercise at this level?: 150+ min  Stress: Stress Concern Present (10/14/2023)   Received from [facility] - Occupational Stress Questionnaire    Feeling of Stress : Rather much  Social Connections: Socially Isolated (10/14/2023)   Received from [facility]   Social Network    How would you rate your social network (family, work, friends)?: Little participation, lonely and socially isolated  Intimate Partner Violence: Not At Risk (03/24/2024)   Humiliation, Afraid, Rape, and Kick questionnaire    Fear of Current or Ex-Partner: No    Emotionally Abused: No    Physically Abused:  No    Sexually Abused: No      Home Medications:   No current facility-administered medications on file prior to encounter.   Current Outpatient Medications on File Prior to Encounter  Medication Sig Dispense Refill   ALPHA LIPOIC ACID PO Take 1 tablet by mouth daily at 6 (six) AM.     aspirin  EC 81 MG tablet Take 2 tablets (162 mg total) by mouth at bedtime. Start taking when you are [redacted] weeks pregnant for rest of pregnancy for prevention of preeclampsia 300 tablet 2   Cholecalciferol (VITAMIN D-3) 125 MCG (5000 UT) TABS Take 1 tablet by mouth daily at 6 (six) AM.  Continuous Glucose Sensor (DEXCOM G7 SENSOR) MISC Use one sensor every 10 days as instructed 9 each 3   cyanocobalamin (VITAMIN B12) 1000 MCG tablet Take 1,000 mcg by mouth daily.     escitalopram (LEXAPRO) 20 MG tablet Take 20 mg by mouth daily.     famotidine (PEPCID) 20 MG tablet Take 20 mg by mouth 2 (two) times daily.     furosemide (LASIX) 40 MG tablet Take 1 tablet (40 mg total) by mouth daily. 10 tablet 0   insulin  aspart (NOVOLOG  FLEXPEN) 100 UNIT/ML FlexPen Inject 20 Units into the skin 3 (three) times daily with meals. 30 mL 2   Labetalol  HCl 400 MG TABS Take 400 mg by mouth 3 (three) times daily. 30 tablet 2   metFORMIN  (GLUCOPHAGE ) 500 MG tablet Take 2 tablets (1,000 mg total) by mouth 2 (two) times daily with a meal. 120 tablet 2   NIFEdipine  (PROCARDIA  XL/NIFEDICAL XL) 60 MG 24 hr tablet Take 1 tablet (60 mg total) by mouth in the morning and at bedtime. 120 tablet 3   ondansetron  (ZOFRAN  ODT) 8 MG disintegrating tablet 8mg  ODT q8 hours prn nausea 10 tablet 0   potassium chloride SA (KLOR-CON M20) 20 MEQ tablet Take 1 tablet (20 mEq total) by mouth daily. 10 tablet 1   Prenatal MV & Min w/FA-DHA (PRENATAL GUMMIES PO) Take 2 tablets by mouth daily at 6 (six) AM.     Calcium Carb-Cholecalciferol (CALCIUM 600+D3) 600-20 MG-MCG TABS Take 1 tablet by mouth daily at 6 (six) AM.     insulin  degludec (TRESIBA) 200  UNIT/ML FlexTouch Pen Inject 30 Units into the skin daily. (Patient taking differently: Inject 25 Units into the skin daily.)     Insulin  Pen Needle (PEN NEEDLES) 32G X 4 MM MISC 1 each by Does not apply route.        Allergies:   No Known Allergies   Physical Exam:   Vitals:   04/12/24 0449 04/12/24 0735  BP: 127/66 (!) 151/75  Pulse: 87 82  Resp: 19 17  Temp: 98.5 F (36.9 C) 97.7 F (36.5 C)  SpO2: 100%    Sitting comfortably on the hospital bed  Nonlabored breathing Normal rate and rhythm Abdomen is nontender  Assessment  Summer Campbell is a 33 y.o. G2P0010 at [redacted]w[redacted]d admitted for the following   Chronic hypertension with superimposed preeclampsia  Supervision of high risk pregnancy, antepartum  Maternal morbid obesity, antepartum (HCC)  [redacted] weeks gestation of pregnancy  BMI 50.0-59.9, adult (HCC)  FGR with absent UA Dopplers  Recommendations - Continue 3 times daily NSTs while inpatient  - Growth ultrasound with UA Dopplers on Wednesday -  If the Dopplers have greater than 50% of the waveforms is absent or if the estimated fetal weight is dropped then delivery will occur this week. - Continue diabetic care - Continue blood pressure management with goal of less than 140/90.  If any severe range blood pressures occur then delivery be strongly considered. - Delivery at 33 weeks is recommended due to the severe fetal growth restriction with abnormal umbilical artery Dopplers in the setting of chronic hypertension with superimposed preeclampsia while maxed out on 2 blood pressure agents.  Approximately 75 minutes were spent in chart review, communication with other providers, patient assessment and education and documentation. Thank you for the opportunity to be involved with this patient's care. Please let us  know if we can be of any further assistance.   Delora Smaller, DO Maternal fetal medicine,  [place]   04/12/2024  9:34 AM

## 2024-04-12 NOTE — Progress Notes (Signed)
 Patient ID: Summer Campbell, female   DOB: 1990/08/01, 33 y.o.   MRN: [id number] FACULTY PRACTICE ANTEPARTUM(COMPREHENSIVE) NOTE  Summer Campbell is a 33 y.o. G2P0010 at [redacted]w[redacted]d by best clinical estimate who is admitted for preeclampsia with severe features superimposed on chronic hypertension, T2DM, fetal growth restriction with absent end-diastolic flow .   Fetal presentation is cephalic. Length of Stay:  19  Days  ASSESSMENT: Principal Problem:   Chronic hypertension with superimposed preeclampsia Active Problems:   Herpes simplex infection of perianal skin   Preexisting diabetes complicating pregnancy, antepartum   Severe pre-eclampsia   IUGR (intrauterine growth restriction) affecting care of mother   PLAN: Chronic hypertension with superimposed preeclampsia Currently on Procardia  60 twice daily Continue labetalol  800 3 times daily SBP in the 140-150/60-80 range Consider delivery if persistently severe or at 34 weeks Status post magnesium x 1 with plan for repeat peripartum   IUGR (intrauterine growth restriction) affecting care of mother Estimated fetal weight at 8 percentile Absent end-diastolic flow Normal AFI Last BPP 8 of 8 Fetal heart tracing is category 1 Dopplers with BPP today Plan for follow-up growth on 11/13  Herpes simplex infection of perianal skin Continue Valtrex for prophylaxis  Preexisting diabetes complicating pregnancy, antepartum Currently on Semglee  10 nightly and short acting 5 with meals Continues on metformin  twice daily CBGs are normal  Severe pre-eclampsia BPs remain in the mild range currently maxed out on 2 medications Last labs were within normal limits yesterday and repeat to 72 hours Plan for delivery around 34 weeks or sooner if BPs become poorly controlled.   Subjective: Feels well today Patient reports the fetal movement as active. Patient reports uterine contraction  activity as none. Patient reports  vaginal bleeding as  none. Patient describes fluid per vagina as None.  Vitals:  Blood pressure (!) 151/75, pulse 82, temperature 97.7 F (36.5 C), temperature source Oral, resp. rate 17, height 5' 4 (1.626 m), weight (!) 152.5 kg, SpO2 100%. Physical Examination:  General appearance - alert, well appearing, and in no distress Chest - normal effort Abdomen - gravid, non-tender Fundal Height:  size equals dates Extremities: edema 2+  Membranes:intact  Fetal Monitoring:  Baseline: 130 bpm, Variability: Good {> 6 bpm), Accelerations: Reactive, and Decelerations: Absent  Labs:  Results for orders placed or performed during the hospital encounter of 03/24/24 (from the past 24 hours)  Glucose, capillary   Collection Time: 04/11/24  3:14 PM  Result Value Ref Range   Glucose-Capillary 155 (H) 70 - 99 mg/dL  Glucose, capillary   Collection Time: 04/11/24  9:51 PM  Result Value Ref Range   Glucose-Capillary 103 (H) 70 - 99 mg/dL  Glucose, capillary   Collection Time: 04/12/24  3:26 AM  Result Value Ref Range   Glucose-Capillary 92 70 - 99 mg/dL  Glucose, capillary   Collection Time: 04/12/24  3:57 AM  Result Value Ref Range   Glucose-Capillary 88 70 - 99 mg/dL  Glucose, capillary   Collection Time: 04/12/24  7:32 AM  Result Value Ref Range   Glucose-Capillary 85 70 - 99 mg/dL    Imaging Studies:    US  MFM UA CORD DOPPLER Result Date: 04/10/2024 ----------------------------------------------------------------------  OBSTETRICS REPORT                        (Signed Final 04/10/2024 08:20 pm) ---------------------------------------------------------------------- Patient Info  ID #:       [id number]  D.O.B.:  March 23, 1991 (33 yrs)(F)  Name:       Summer Campbell                 Visit Date: 04/09/2024 08:10 am ---------------------------------------------------------------------- Performed By  Attending:        Nathanel Fetters      Ref. Address:      [address]                    MD                                                               [place], [place]                                                              [address]  Performed By:     Metta Ar          Secondary Phy.:    [facility]                    RDMS                                                              Care  Referred By:      BEBE                [facility]ICKENS MD                                [facility] ---------------------------------------------------------------------- Orders  #  Description                           Code        Ordered By  1  US  MFM UA CORD DOPPLER                76820.02    UGONNA ANYANWU  2  US  MFM FETAL BPP WO NON               23180.98    UGONNA ANYANWU     STRESS ----------------------------------------------------------------------  #  Order #                     Accession #                Episode #  1  [id number]                   [id number]                 [id number]  2  [id number]                   [id number]                 [id number] ---------------------------------------------------------------------- Indications  Maternal care for known or suspected poor       O36.5930  fetal growth, third trimester, single or  unspecified fetus IUGR  Severe preeclampsia, third trimester            O14.13  [redacted] weeks gestation of pregnancy                 Z3A.32  Pre-existing diabetes, type 2, in pregnancy,    O24.113  third trimester (insulin )  Pre-existing essential hypertension             O10.013  complicating pregnancy, third trimester  (procardia  and labetalol )  Obesity complicating pregnancy, third           O99.213  trimester (BMI 51.7)  Anxiety during pregnancy, third trimester       O99.343, F41.9 ---------------------------------------------------------------------- Fetal Evaluation  Num Of Fetuses:          1  Fetal Heart Rate(bpm):   132  Cardiac Activity:        Observed  Presentation:            Cephalic  Placenta:                Posterior   P. Cord Insertion:       Previously seen  Amniotic Fluid  AFI FV:      Within normal limits  AFI Sum(cm)     %Tile       Largest Pocket(cm)  15              53          5.2  RUQ(cm)       RLQ(cm)       LUQ(cm)        LLQ(cm)  1.2           5.2           3.8            4.8 ---------------------------------------------------------------------- Biophysical Evaluation  Amniotic F.V:   Pocket => 2 cm             F. Tone:         Observed  F. Movement:    Observed                   Score:           8/8  F. Breathing:   Observed ---------------------------------------------------------------------- OB History  Blood Type:   A+  Gravidity:    2          SAB:   1  Living:       0 ---------------------------------------------------------------------- Gestational Age  Best:          32w 3d     Det. By:  Early Ultrasound         EDD:   06/01/24                                      (10/26/23) ---------------------------------------------------------------------- Doppler - Fetal Vessels  Umbilical Artery    S/D    %tile      RI    %tile      PI    %  tile     PSV    ADFV    RDFV                                                     (cm/s)    7.4   > 97.5     0.9   > 97.5     1.8   > 97.5     28.4     Yes      No ---------------------------------------------------------------------- Impression  Antenatal testing due to IUGR with an EFW 8th%  Biophysical profile 8/8 with good fetal movement and  amniotic fluid volume  UA Dopplers are elevated with intermittent absent EDF there  is no evidence of REDF ---------------------------------------------------------------------- Recommendations  Continue 2 x daily NST  Twice weekly UAD with BPP  Repet growth this week  Consider delivery for non-reassuring NST  Delivery by 34 weeks ----------------------------------------------------------------------              Nathanel Fetters, MD Electronically Signed Final Report   04/10/2024 08:20 pm  ----------------------------------------------------------------------   US  MFM FETAL BPP WO NON STRESS Result Date: 04/10/2024 ----------------------------------------------------------------------  OBSTETRICS REPORT                        (Signed Final 04/10/2024 08:20 pm) ---------------------------------------------------------------------- Patient Info  ID #:       [id number]                          D.O.B.:  1991-02-24 (32 yrs)(F)  Name:       Summer Campbell                 Visit Date: 04/09/2024 08:10 am ---------------------------------------------------------------------- Performed By  Attending:        Nathanel Fetters      Ref. Address:      [address]                    MD                                                              [place], [place]                                                              [address]  Performed By:     Metta Margaretha Schlein Phy.:    [facility]                    RDMS  Care  Referred By:      BEBE                [facility] ---------------------------------------------------------------------- Orders  #  Description                           Code        Ordered By  1  US  MFM UA CORD DOPPLER                76820.02    UGONNA ANYANWU  2  US  MFM FETAL BPP WO NON               76819.01    UGONNA ANYANWU     STRESS ----------------------------------------------------------------------  #  Order #                     Accession #                Episode #  1  [id number]                   [id number]                 [id number]  2  [id number]                   [id number]                 [id number] ---------------------------------------------------------------------- Indications  Maternal care for known or suspected poor       O36.5930  fetal growth, third trimester, single or  unspecified fetus IUGR  Severe  preeclampsia, third trimester            O14.13  [redacted] weeks gestation of pregnancy                 Z3A.32  Pre-existing diabetes, type 2, in pregnancy,    O24.113  third trimester (insulin )  Pre-existing essential hypertension             O10.013  complicating pregnancy, third trimester  (procardia  and labetalol )  Obesity complicating pregnancy, third           O99.213  trimester (BMI 51.7)  Anxiety during pregnancy, third trimester       O99.343, F41.9 ---------------------------------------------------------------------- Fetal Evaluation  Num Of Fetuses:          1  Fetal Heart Rate(bpm):   132  Cardiac Activity:        Observed  Presentation:            Cephalic  Placenta:                Posterior  P. Cord Insertion:       Previously seen  Amniotic Fluid  AFI FV:      Within normal limits  AFI Sum(cm)     %Tile       Largest Pocket(cm)  15              53          5.2  RUQ(cm)       RLQ(cm)       LUQ(cm)        LLQ(cm)  1.2           5.2           3.8            4.8 ---------------------------------------------------------------------- Biophysical Evaluation  Amniotic F.V:   Pocket => 2 cm             F. Tone:         Observed  F. Movement:    Observed                   Score:           8/8  F. Breathing:   Observed ---------------------------------------------------------------------- OB History  Blood Type:   A+  Gravidity:    2          SAB:   1  Living:       0 ---------------------------------------------------------------------- Gestational Age  Best:          32w 3d     Det. By:  Early Ultrasound         EDD:   06/01/24                                      (10/26/23) ---------------------------------------------------------------------- Doppler - Fetal Vessels  Umbilical Artery    S/D    %tile      RI    %tile      PI    %tile     PSV    ADFV    RDFV                                                     (cm/s)    7.4   > 97.5     0.9   > 97.5     1.8   > 97.5     28.4     Yes      No  ---------------------------------------------------------------------- Impression  Antenatal testing due to IUGR with an EFW 8th%  Biophysical profile 8/8 with good fetal movement and  amniotic fluid volume  UA Dopplers are elevated with intermittent absent EDF there  is no evidence of REDF ---------------------------------------------------------------------- Recommendations  Continue 2 x daily NST  Twice weekly UAD with BPP  Repet growth this week  Consider delivery for non-reassuring NST  Delivery by 34 weeks ----------------------------------------------------------------------              Nathanel Fetters, MD Electronically Signed Final Report   04/10/2024 08:20 pm ----------------------------------------------------------------------   US  MFM UA CORD DOPPLER Result Date: 04/05/2024 ----------------------------------------------------------------------  OBSTETRICS REPORT                       (Signed Final 04/05/2024 05:08 pm) ---------------------------------------------------------------------- Patient Info  ID #:       [id number]                          D.O.B.:  February 10, 1991 (32 yrs)(F)  Name:       Summer Campbell  Visit Date: 04/05/2024 07:16 am ---------------------------------------------------------------------- Performed By  Attending:        Steffan Keys MD         Ref. Address:     [address]                                                             [place], [place]                                                             [address]  Performed By:     Powell Breen BS       Secondary Phy.:   [facility]                    RDMS                                                             Care  Referred By:      BEBE                [facility] ---------------------------------------------------------------------- Orders  #  Description                           Code        Ordered By  1   US  MFM FETAL BPP WO NON               76819.01    CHARLIE PICKENS     STRESS  2  US  MFM UA CORD DOPPLER                23179.97    CHARLIE PICKENS ----------------------------------------------------------------------  #  Order #                     Accession #                Episode #  1  [id number]                   [id number]                 [id number]  2  [id number]                   [id number]                 [id number] ---------------------------------------------------------------------- Indications  Maternal care for known or suspected poor      O36.5930  fetal growth, third trimester, single or  unspecified fetus IUGR  Severe preeclampsia, third trimester           O14.13  Pre-existing diabetes, type 2, in pregnancy,   O24.113  third trimester (insulin )  Pre-existing essential hypertension            O10.013  complicating pregnancy, third trimester  (procardia  and labetalol )  Obesity complicating pregnancy, third          O99.213  trimester (BMI 51.7)  Anxiety during pregnancy, third trimester      O99.343, F41.9  [redacted] weeks gestation of pregnancy                Z3A.31 ---------------------------------------------------------------------- Fetal Evaluation  Num Of Fetuses:         1  Fetal Heart Rate(bpm):  140  Cardiac Activity:       Observed  Presentation:           Cephalic  Placenta:               Posterior  P. Cord Insertion:      Previously seen  Amniotic Fluid  AFI FV:      Within normal limits  AFI Sum(cm)     %Tile       Largest Pocket(cm)  12.9            38          4.8  RUQ(cm)       RLQ(cm)       LUQ(cm)        LLQ(cm)  3.8           0             4.8            4.3 ---------------------------------------------------------------------- Biophysical Evaluation  Amniotic F.V:   Pocket => 2 cm             F. Tone:        Observed  F. Movement:    Observed                   Score:          8/8  F. Breathing:   Observed ---------------------------------------------------------------------- OB History   Blood Type:   A+  Gravidity:    2          SAB:   1  Living:       0 ---------------------------------------------------------------------- Gestational Age  Best:          31w 6d     Det. By:  Early Ultrasound         EDD:   06/01/24                                      (10/26/23) ---------------------------------------------------------------------- Anatomy  Cranium:               Previously seen        Aortic Arch:            Not well visualized  Cavum:                 Previously seen        Ductal Arch:            Previously seen  Ventricles:            Previously seen  Diaphragm:              Appears normal  Choroid Plexus:        Previously seen        Stomach:                Appears normal, left                                                                        sided  Cerebellum:            Not well visualized    Abdomen:                Previously seen  Posterior Fossa:       Not well visualized    Abdominal Wall:         Not well visualized  Face:                  Not well visualized    Cord Vessels:           Not well visualized  Lips:                  Not well visualized    Kidneys:                Appear normal  Thoracic:              Previously seen        Bladder:                Appears normal  Heart:                 Not well visualized    Spine:                  Previously seen  RVOT:                  Not well visualized    Upper Extremities:      Previously seen  LVOT:                  Not well visualized    Lower Extremities:      Previously seen ---------------------------------------------------------------------- Doppler - Fetal Vessels  Umbilical Artery                                                              ADFV    RDFV                                                                Yes      No ---------------------------------------------------------------------- Cervix Uterus Adnexa  Cervix  Not visualized (advanced GA >24wks)  Uterus  No abnormality visualized.  Right Ovary  Not  visualized.  Left Ovary  Not visualized.  Cul De Sac  No free fluid seen.  Adnexa  No abnormality visualized ---------------------------------------------------------------------- Comments  This patient has been hospitalized due to chronic  hypertension with superimposed preeclampsia along with  IUGR with intermittent absent end-diastolic flow.  A biophysical profile performed today was 8/8.  The AFI was 12.9 cm.  Vertex presentation.  Doppler studies of the umbilical arteries performed today  showed persistent absent end-diastolic flow.  There were no  signs of reversed end-diastolic flow.  The persistent end-diastolic flow is a new finding as her prior  exam showed intermittent absent end-diastolic flow.  She should continue inpatient management with daily fetal  testing.  Delivery is recommended by 34 weeks.  Delivery is recommended prior to 34 weeks showed reversed  end-diastolic flow be noted, should her preeclampsia worsen,  or should she have any abnormalities in her [facility] labs. ----------------------------------------------------------------------                   Steffan Keys, MD Electronically Signed Final Report   04/05/2024 05:08 pm ----------------------------------------------------------------------   US  MFM FETAL BPP WO NON STRESS Result Date: 04/05/2024 ----------------------------------------------------------------------  OBSTETRICS REPORT                       (Signed Final 04/05/2024 05:08 pm) ---------------------------------------------------------------------- Patient Info  ID #:       [id number]                          D.O.B.:  03-Oct-1990 (32 yrs)(F)  Name:       Summer Campbell                 Visit Date: 04/05/2024 07:16 am ---------------------------------------------------------------------- Performed By  Attending:        Steffan Keys MD         Ref. Address:     [address]                                                             [place], [place]                                                              [address]  Performed By:     Powell Breen BS       Secondary Phy.:   [facility]                    RDMS                                                             Care  Referred By:      CHARLIE                [facility] ---------------------------------------------------------------------- Orders  #  Description                           Code        Ordered By  1  US  MFM FETAL BPP WO NON               76819.01    CHARLIE PICKENS     STRESS  2  US  MFM UA CORD DOPPLER                76820.02    CHARLIE PICKENS ----------------------------------------------------------------------  #  Order #                     Accession #                Episode #  1  [id number]                   [id number]                 [id number]  2  [id number]                   [id number]                 [id number] ---------------------------------------------------------------------- Indications  Maternal care for known or suspected poor      O36.5930  fetal growth, third trimester, single or  unspecified fetus IUGR  Severe preeclampsia, third trimester           O14.13  Pre-existing diabetes, type 2, in pregnancy,   O24.113  third trimester (insulin )  Pre-existing essential hypertension            O10.013  complicating pregnancy, third trimester  (procardia  and labetalol )  Obesity complicating pregnancy, third          O99.213  trimester (BMI 51.7)  Anxiety during pregnancy, third trimester      O99.343, F41.9  [redacted] weeks gestation of pregnancy                Z3A.31 ---------------------------------------------------------------------- Fetal Evaluation  Num Of Fetuses:         1  Fetal Heart Rate(bpm):  140  Cardiac Activity:       Observed  Presentation:           Cephalic  Placenta:               Posterior  P. Cord Insertion:      Previously seen  Amniotic Fluid  AFI FV:      Within normal limits  AFI Sum(cm)     %Tile        Largest Pocket(cm)  12.9            38          4.8  RUQ(cm)       RLQ(cm)       LUQ(cm)        LLQ(cm)  3.8           0             4.8            4.3 ---------------------------------------------------------------------- Biophysical Evaluation  Amniotic F.V:   Pocket =>  2 cm             F. Tone:        Observed  F. Movement:    Observed                   Score:          8/8  F. Breathing:   Observed ---------------------------------------------------------------------- OB History  Blood Type:   A+  Gravidity:    2          SAB:   1  Living:       0 ---------------------------------------------------------------------- Gestational Age  Best:          31w 6d     Det. By:  Early Ultrasound         EDD:   06/01/24                                      (10/26/23) ---------------------------------------------------------------------- Anatomy  Cranium:               Previously seen        Aortic Arch:            Not well visualized  Cavum:                 Previously seen        Ductal Arch:            Previously seen  Ventricles:            Previously seen        Diaphragm:              Appears normal  Choroid Plexus:        Previously seen        Stomach:                Appears normal, left                                                                        sided  Cerebellum:            Not well visualized    Abdomen:                Previously seen  Posterior Fossa:       Not well visualized    Abdominal Wall:         Not well visualized  Face:                  Not well visualized    Cord Vessels:           Not well visualized  Lips:                  Not well visualized    Kidneys:                Appear normal  Thoracic:              Previously seen        Bladder:                Appears  normal  Heart:                 Not well visualized    Spine:                  Previously seen  RVOT:                  Not well visualized    Upper Extremities:      Previously seen  LVOT:                  Not well visualized    Lower  Extremities:      Previously seen ---------------------------------------------------------------------- Doppler - Fetal Vessels  Umbilical Artery                                                              ADFV    RDFV                                                                Yes      No ---------------------------------------------------------------------- Cervix Uterus Adnexa  Cervix  Not visualized (advanced GA >24wks)  Uterus  No abnormality visualized.  Right Ovary  Not visualized.  Left Ovary  Not visualized.  Cul De Sac  No free fluid seen.  Adnexa  No abnormality visualized ---------------------------------------------------------------------- Comments  This patient has been hospitalized due to chronic  hypertension with superimposed preeclampsia along with  IUGR with intermittent absent end-diastolic flow.  A biophysical profile performed today was 8/8.  The AFI was 12.9 cm.  Vertex presentation.  Doppler studies of the umbilical arteries performed today  showed persistent absent end-diastolic flow.  There were no  signs of reversed end-diastolic flow.  The persistent end-diastolic flow is a new finding as her prior  exam showed intermittent absent end-diastolic flow.  She should continue inpatient management with daily fetal  testing.  Delivery is recommended by 34 weeks.  Delivery is recommended prior to 34 weeks showed reversed  end-diastolic flow be noted, should her preeclampsia worsen,  or should she have any abnormalities in her [facility] labs. ----------------------------------------------------------------------                   Steffan Keys, MD Electronically Signed Final Report   04/05/2024 05:08 pm ----------------------------------------------------------------------   US  MFM FETAL BPP WO NON STRESS Result Date: 04/04/2024 ----------------------------------------------------------------------  OBSTETRICS REPORT                       (Signed Final 04/04/2024 08:01 pm)  ---------------------------------------------------------------------- Patient Info  ID #:       [id number]                          D.O.B.:  1990/10/14 (32 yrs)(F)  Name:       Summer Campbell                 Visit Date: 04/04/2024 03:20 pm ---------------------------------------------------------------------- Performed By  Attending:  Steffan Keys MD         Ref. Address:      [address]                                                              [place], [place]                                                              [address]  Performed By:     Metta Chang RDMS         Secondary Phy.:    [facility]                                                              Care  Referred By:      BEBE                [facility]                    PICKENS MD                                [facility] ---------------------------------------------------------------------- Orders  #  Description                           Code        Ordered By  1  US  MFM FETAL BPP WO NON               76819.01    [facility]     STRESS ----------------------------------------------------------------------  #  Order #                     Accession #                Episode #  1  [id number]                   [id number]                 [id number] ---------------------------------------------------------------------- Indications  Non-reactive NST                                O28.9  Maternal care for known or suspected poor       O36.5930  fetal growth, third trimester, single or  unspecified fetus IUGR  Severe preeclampsia, third trimester            O14.13  Pre-existing diabetes, type 2, in pregnancy,    O24.113  third trimester (insulin )  [redacted] weeks gestation of pregnancy  Z3A.31  Pre-existing essential hypertension             O10.013  complicating pregnancy, third trimester  (procardia  and labetalol )  Obesity complicating pregnancy, third           O99.213  trimester (BMI 51.7)  Anxiety during  pregnancy, third trimester       O99.343, F41.9 ---------------------------------------------------------------------- Fetal Evaluation  Num Of Fetuses:          1  Fetal Heart Rate(bpm):   137  Cardiac Activity:        Observed  Presentation:            Cephalic  Placenta:                Posterior  Amniotic Fluid  AFI FV:      Within normal limits  AFI Sum(cm)     %Tile       Largest Pocket(cm)  10.6            20          4.7  RUQ(cm)       RLQ(cm)       LUQ(cm)        LLQ(cm)  4.1           0             1.8            4.7 ---------------------------------------------------------------------- Biophysical Evaluation  Amniotic F.V:   Pocket => 2 cm             F. Tone:         Observed  F. Movement:    Observed                   Score:           8/8  F. Breathing:   Observed ---------------------------------------------------------------------- OB History  Blood Type:   A+  Gravidity:    2          SAB:   1  Living:       0 ---------------------------------------------------------------------- Gestational Age  Best:          31w 5d     Det. By:  Early Ultrasound         EDD:   06/01/24                                      (10/26/23) ---------------------------------------------------------------------- Comments  This patient has been hospitalized due to chronic  hypertension with superimposed preeclampsia along with  IUGR with intermittent absent end-diastolic flow.  A biophysical profile performed today was 8/8.  The AFI was 10.6 cm.  Vertex presentation. ----------------------------------------------------------------------                   Steffan Keys, MD Electronically Signed Final Report   04/04/2024 08:01 pm ----------------------------------------------------------------------   US  MFM UA CORD DOPPLER Result Date: 04/02/2024 ----------------------------------------------------------------------  OBSTETRICS REPORT                       (Signed Final 04/02/2024 09:54 am)  ---------------------------------------------------------------------- Patient Info  ID #:       [id number]                          D.O.B.:  04-05-1991 (32 yrs)(F)  Name:  Summer Campbell                 Visit Date: 04/01/2024 03:40 pm ---------------------------------------------------------------------- Performed By  Attending:        Steffan Keys MD         Ref. Address:     [address]                                                             [place], [place]                                                             [address]  Performed By:     Burnard LITTIE Campbell          Secondary Phy.:   [facility]                    RDMS                                                             Care  Referred By:      BEBE                [facility]ENS MD                               [facility] ---------------------------------------------------------------------- Orders  #  Description                           Code        Ordered By  1  US  MFM UA CORD DOPPLER                76820.02    UGONNA ANYANWU  2  US  MFM FETAL BPP WO NON               76819.01    UGONNA ANYANWU     STRESS ----------------------------------------------------------------------  #  Order #                     Accession #                Episode #  1  [id number]                   [id number]                 [id number]  2  [id number]                   [id number]  [id number] ---------------------------------------------------------------------- Indications  Severe preeclampsia, third trimester           O14.13  Pre-existing diabetes, type 2, in pregnancy,   O24.113  third trimester (insulin )  Maternal care for known or suspected poor      O36.5930  fetal growth, third trimester, single or  unspecified fetus IUGR  Pre-existing essential hypertension            O10.013  complicating pregnancy, third trimester  (procardia  and labetalol )  Obesity complicating pregnancy, third          O99.213   trimester (BMI 51.7)  [redacted] weeks gestation of pregnancy                Z3A.31  Anxiety during pregnancy, third trimester      O99.343, F41.9 ---------------------------------------------------------------------- Fetal Evaluation  Num Of Fetuses:         1  Fetal Heart Rate(bpm):  146  Cardiac Activity:       Observed  Presentation:           Cephalic  Placenta:               Posterior  Amniotic Fluid  AFI FV:      Within normal limits  AFI Sum(cm)     %Tile       Largest Pocket(cm)  10.4            18          3.8  RUQ(cm)       RLQ(cm)       LUQ(cm)        LLQ(cm)  3.8           1.7           1.5            3.4 ---------------------------------------------------------------------- Biophysical Evaluation  Amniotic F.V:   Within normal limits       F. Tone:        Observed  F. Movement:    Observed                   Score:          8/8  F. Breathing:   Observed ---------------------------------------------------------------------- OB History  Blood Type:   A+  Gravidity:    2          SAB:   1  Living:       0 ---------------------------------------------------------------------- Gestational Age  Best:          31w 2d     Det. By:  Early Ultrasound         EDD:   06/01/24                                      (10/26/23) ---------------------------------------------------------------------- Anatomy  Stomach:               Appears normal, left   Bladder:                Appears normal                         sided ---------------------------------------------------------------------- Doppler - Fetal Vessels  Umbilical Artery   S/D     %tile      RI    %tile      PI    %tile  PSV    ADFV    RDFV                                                     (cm/s)    4.9   > 97.5     0.8   > 97.5     1.5   > 97.5     38.5     Yes      No ---------------------------------------------------------------------- Comments  This patient has been hospitalized due to preeclampsia with  severe features.  IUGR was noted on her prior  ultrasound  exam.  A BPP performed today was 8/8.  The total AFI was 11.4 cm.  Vertex presentation.  Doppler studies of the umbilical arteries continues to show an  elevated S/D ratio of 4.9.  An occasional episode of absent  end-diastolic flow was noted.  There were no signs of  reversed end-diastolic flow noted today.  She should continue the plan for inpatient management,  twice weekly umbilical artery Doppler studies and BPP's, and  twice daily NSTs.  Delivery is planned for 34 weeks. ----------------------------------------------------------------------                   Steffan Keys, MD Electronically Signed Final Report   04/02/2024 09:54 am ----------------------------------------------------------------------   US  MFM FETAL BPP WO NON STRESS Result Date: 04/02/2024 ----------------------------------------------------------------------  OBSTETRICS REPORT                       (Signed Final 04/02/2024 09:54 am) ---------------------------------------------------------------------- Patient Info  ID #:       [id number]                          D.O.B.:  1990-07-06 (32 yrs)(F)  Name:       Summer Campbell                 Visit Date: 04/01/2024 03:40 pm ---------------------------------------------------------------------- Performed By  Attending:        Steffan Keys MD         Ref. Address:     [address]                                                             [place], [place]                                                             [address]  Performed By:     Burnard CROME Campbell          Secondary Phy.:   [facility]                    RDMS  Care  Referred By:      BEBE                [facility] ---------------------------------------------------------------------- Orders  #  Description                           Code        Ordered By  1  US  MFM UA CORD  DOPPLER                76820.02    UGONNA ANYANWU  2  US  MFM FETAL BPP WO NON               76819.01    UGONNA ANYANWU     STRESS ----------------------------------------------------------------------  #  Order #                     Accession #                Episode #  1  [id number]                   [id number]                 [id number]  2  [id number]                   [id number]                 [id number] ---------------------------------------------------------------------- Indications  Severe preeclampsia, third trimester           O14.13  Pre-existing diabetes, type 2, in pregnancy,   O24.113  third trimester (insulin )  Maternal care for known or suspected poor      O36.5930  fetal growth, third trimester, single or  unspecified fetus IUGR  Pre-existing essential hypertension            O10.013  complicating pregnancy, third trimester  (procardia  and labetalol )  Obesity complicating pregnancy, third          O99.213  trimester (BMI 51.7)  [redacted] weeks gestation of pregnancy                Z3A.31  Anxiety during pregnancy, third trimester      O99.343, F41.9 ---------------------------------------------------------------------- Fetal Evaluation  Num Of Fetuses:         1  Fetal Heart Rate(bpm):  146  Cardiac Activity:       Observed  Presentation:           Cephalic  Placenta:               Posterior  Amniotic Fluid  AFI FV:      Within normal limits  AFI Sum(cm)     %Tile       Largest Pocket(cm)  10.4            18          3.8  RUQ(cm)       RLQ(cm)       LUQ(cm)        LLQ(cm)  3.8  1.7           1.5            3.4 ---------------------------------------------------------------------- Biophysical Evaluation  Amniotic F.V:   Within normal limits       F. Tone:        Observed  F. Movement:    Observed                   Score:          8/8  F. Breathing:   Observed ---------------------------------------------------------------------- OB History  Blood Type:   A+  Gravidity:    2          SAB:   1  Living:        0 ---------------------------------------------------------------------- Gestational Age  Best:          31w 2d     Det. By:  Early Ultrasound         EDD:   06/01/24                                      (10/26/23) ---------------------------------------------------------------------- Anatomy  Stomach:               Appears normal, left   Bladder:                Appears normal                         sided ---------------------------------------------------------------------- Doppler - Fetal Vessels  Umbilical Artery   S/D     %tile      RI    %tile      PI    %tile     PSV    ADFV    RDFV                                                     (cm/s)    4.9   > 97.5     0.8   > 97.5     1.5   > 97.5     38.5     Yes      No ---------------------------------------------------------------------- Comments  This patient has been hospitalized due to preeclampsia with  severe features.  IUGR was noted on her prior ultrasound  exam.  A BPP performed today was 8/8.  The total AFI was 11.4 cm.  Vertex presentation.  Doppler studies of the umbilical arteries continues to show an  elevated S/D ratio of 4.9.  An occasional episode of absent  end-diastolic flow was noted.  There were no signs of  reversed end-diastolic flow noted today.  She should continue the plan for inpatient management,  twice weekly umbilical artery Doppler studies and BPP's, and  twice daily NSTs.  Delivery is planned for 34 weeks. ----------------------------------------------------------------------                   Steffan Keys, MD Electronically Signed Final Report   04/02/2024 09:54 am ----------------------------------------------------------------------   US  MFM UA CORD DOPPLER Result Date: 03/29/2024 ----------------------------------------------------------------------  OBSTETRICS REPORT                       (Signed Final 03/29/2024 05:29  pm) ---------------------------------------------------------------------- Patient Info  ID #:        [id number]                          D.O.B.:  June 02, 1991 (32 yrs)(F)  Name:       Summer Campbell                 Visit Date: 03/29/2024 08:10 am ---------------------------------------------------------------------- Performed By  Attending:        Steffan Keys MD         Ref. Address:     [address]                                                             [place], [place]                                                             [address]  Performed By:     Powell Breen BS       Secondary Phy.:   [facility]                    RDMS                                                             Care  Referred By:      BEBE                [facility]ICKENS [facility] ---------------------------------------------------------------------- Orders  #  Description                           Code        Ordered By  1  US  MFM UA CORD DOPPLER                76820.02    LAWRENCE BASS  2  US  MFM FETAL BPP WO NON               23180.98    JERILYNN BUDDLE     STRESS ----------------------------------------------------------------------  #  Order #                     Accession #                Episode #  1  [id number]                   [id number]  [id number]  2  [id number]                   [id number]                 [id number] ---------------------------------------------------------------------- Indications  Maternal care for known or suspected poor      O36.5930  fetal growth, third trimester, single or  unspecified fetus IUGR  Severe preeclampsia, third trimester           O14.13  Pre-existing diabetes, type 2, in pregnancy,   O24.113  third trimester (insulin )  Anxiety during pregnancy, third trimester      O99.343, F41.9  Obesity complicating pregnancy, third          O99.213  trimester (BMI 51.7)  Pre-existing essential hypertension            O10.013  complicating pregnancy, third trimester  (procardia  and labetalol )  LR NIPS, neg  Horizon, neg AFP  [redacted] weeks gestation of pregnancy                Z3A.30 ---------------------------------------------------------------------- Fetal Evaluation  Num Of Fetuses:         1  Fetal Heart Rate(bpm):  129  Cardiac Activity:       Observed  Presentation:           Cephalic  Placenta:               Posterior  P. Cord Insertion:      Previously seen  Amniotic Fluid  AFI FV:      Within normal limits  AFI Sum(cm)     %Tile       Largest Pocket(cm)  19.5            75          5.2  RUQ(cm)       RLQ(cm)       LUQ(cm)        LLQ(cm)  5.2           4.6           4.7            5 ---------------------------------------------------------------------- Biophysical Evaluation  Amniotic F.V:   Pocket => 2 cm             F. Tone:        Observed  F. Movement:    Observed                   Score:          8/8  F. Breathing:   Observed ---------------------------------------------------------------------- OB History  Blood Type:   A+  Gravidity:    2          SAB:   1  Living:       0 ---------------------------------------------------------------------- Gestational Age  Best:          30w 6d     Det. By:  Early Ultrasound         EDD:   06/01/24                                      (10/26/23) ---------------------------------------------------------------------- Anatomy  Cranium:               Appears normal         Stomach:  Appears normal, left                                                                        sided  Thoracic:              Appears normal         Kidneys:                Appear normal  Diaphragm:             Appears normal         Bladder:                Appears normal ---------------------------------------------------------------------- Doppler - Fetal Vessels  Umbilical Artery   S/D     %tile      RI    %tile      PI    %tile     PSV    ADFV    RDFV                                                     (cm/s)    4.5   > 97.5     0.8   > 97.5     1.5   > 97.5     35.7     Yes      No  ---------------------------------------------------------------------- Cervix Uterus Adnexa  Cervix  Not visualized (advanced GA >24wks)  Uterus  No abnormality visualized.  Right Ovary  Not visualized.  Left Ovary  Not visualized.  Cul De Sac  No free fluid seen.  Adnexa  No abnormality visualized ---------------------------------------------------------------------- Comments  This patient has been hospitalized due to preeclampsia with  severe features.  IUGR was noted on prior ultrasound exam.  A BPP performed today was 8/8.  The total AFI was 19.5 cm.  Vertex presentation.  Doppler studies of the umbilical arteries continues to show an  elevated S/D ratio of 4.5.  An occasional episode of absent  end-diastolic flow was noted.  There were no signs of  reversed end-diastolic flow noted today.  She should continue the plan for inpatient management,  twice weekly umbilical artery Doppler studies and BPP's, and  twice daily NSTs.  Delivery is planned for 34 weeks. ----------------------------------------------------------------------                   Steffan Keys, MD Electronically Signed Final Report   03/29/2024 05:29 pm ----------------------------------------------------------------------   US  MFM FETAL BPP WO NON STRESS Result Date: 03/29/2024 ----------------------------------------------------------------------  OBSTETRICS REPORT                       (Signed Final 03/29/2024 05:29 pm) ---------------------------------------------------------------------- Patient Info  ID #:       [id number]                          D.O.B.:  05/13/1991 (32 yrs)(F)  Name:       Averi L Racey  Visit Date: 03/29/2024 08:10 am ---------------------------------------------------------------------- Performed By  Attending:        Steffan Keys MD         Ref. Address:     [address]                                                             [place], [place]                                                              [address]  Performed By:     Powell Breen BS       Secondary Phy.:   [facility]                    RDMS                                                             Care  Referred By:      BEBE                [facility]                    PICKENS MD                               [facility] ---------------------------------------------------------------------- Orders  #  Description                           Code        Ordered By  1  US  MFM UA CORD DOPPLER                76820.02    LAWRENCE BASS  2  US  MFM FETAL BPP WO NON               76819.01    JERILYNN BUDDLE     STRESS ----------------------------------------------------------------------  #  Order #                     Accession #                Episode #  1  [id number]                   [id number]                 [id number]  2  [id number]                   [id number]                 [id number] ---------------------------------------------------------------------- Indications  Maternal care for known or suspected poor      O36.5930  fetal growth, third trimester, single or  unspecified fetus IUGR  Severe preeclampsia, third trimester           O14.13  Pre-existing diabetes, type 2, in pregnancy,   O24.113  third trimester (insulin )  Anxiety during pregnancy, third trimester      O99.343, F41.9  Obesity complicating pregnancy, third          O99.213  trimester (BMI 51.7)  Pre-existing essential hypertension            O10.013  complicating pregnancy, third trimester  (procardia  and labetalol )  LR NIPS, neg Horizon, neg AFP  [redacted] weeks gestation of pregnancy                Z3A.30 ---------------------------------------------------------------------- Fetal Evaluation  Num Of Fetuses:         1  Fetal Heart Rate(bpm):  129  Cardiac Activity:       Observed  Presentation:           Cephalic  Placenta:               Posterior  P. Cord Insertion:      Previously seen  Amniotic Fluid  AFI FV:      Within normal limits  AFI Sum(cm)     %Tile        Largest Pocket(cm)  19.5            75          5.2  RUQ(cm)       RLQ(cm)       LUQ(cm)        LLQ(cm)  5.2           4.6           4.7            5 ---------------------------------------------------------------------- Biophysical Evaluation  Amniotic F.V:   Pocket => 2 cm             F. Tone:        Observed  F. Movement:    Observed                   Score:          8/8  F. Breathing:   Observed ---------------------------------------------------------------------- OB History  Blood Type:   A+  Gravidity:    2          SAB:   1  Living:       0 ---------------------------------------------------------------------- Gestational Age  Best:          30w 6d     Det. By:  Early Ultrasound         EDD:   06/01/24                                      (10/26/23) ---------------------------------------------------------------------- Anatomy  Cranium:               Appears normal         Stomach:                Appears normal, left  sided  Thoracic:              Appears normal         Kidneys:                Appear normal  Diaphragm:             Appears normal         Bladder:                Appears normal ---------------------------------------------------------------------- Doppler - Fetal Vessels  Umbilical Artery   S/D     %tile      RI    %tile      PI    %tile     PSV    ADFV    RDFV                                                     (cm/s)    4.5   > 97.5     0.8   > 97.5     1.5   > 97.5     35.7     Yes      No ---------------------------------------------------------------------- Cervix Uterus Adnexa  Cervix  Not visualized (advanced GA >24wks)  Uterus  No abnormality visualized.  Right Ovary  Not visualized.  Left Ovary  Not visualized.  Cul De Sac  No free fluid seen.  Adnexa  No abnormality visualized ---------------------------------------------------------------------- Comments  This patient has been hospitalized due to preeclampsia  with  severe features.  IUGR was noted on prior ultrasound exam.  A BPP performed today was 8/8.  The total AFI was 19.5 cm.  Vertex presentation.  Doppler studies of the umbilical arteries continues to show an  elevated S/D ratio of 4.5.  An occasional episode of absent  end-diastolic flow was noted.  There were no signs of  reversed end-diastolic flow noted today.  She should continue the plan for inpatient management,  twice weekly umbilical artery Doppler studies and BPP's, and  twice daily NSTs.  Delivery is planned for 34 weeks. ----------------------------------------------------------------------                   Steffan Keys, MD Electronically Signed Final Report   03/29/2024 05:29 pm ----------------------------------------------------------------------   US  MFM OB FOLLOW UP Result Date: 03/25/2024 ----------------------------------------------------------------------  OBSTETRICS REPORT                       (Signed Final 03/25/2024 05:31 pm) ---------------------------------------------------------------------- Patient Info  ID #:       [id number]                          D.O.B.:  [birth year] (32 yrs)(F)  Name:       Summer CROME Donigan                 Visit Date: 03/25/2024 10:29 am ---------------------------------------------------------------------- Performed By  Attending:        Nathanel Fetters      Ref. Address:     [address]                    [place]  [place], [place]                                                             [address]  Performed By:     Burnard LITTIE Campbell          Secondary Phy.:   [facility]                    RDMS                                                             Care  Referred By:      BEBE                [facility] [place]                               [facility] ---------------------------------------------------------------------- Orders  #  Description                            Code        Ordered By  1  US  MFM OB FOLLOW UP                   76816.01    CHARLIE PICKENS  2  US  MFM UA CORD DOPPLER                76820.02    CHARLIE PICKENS  3  US  MFM FETAL BPP WO NON               76819.01    CHARLIE PICKENS     STRESS ----------------------------------------------------------------------  #  Order #                     Accession #                Episode #  1  [id number]                   [id number]                 [id number]  2  [id number]                   [id number]                 [id number]  3  [id number]                   [id number]                 [id number] ---------------------------------------------------------------------- Indications  Severe preeclampsia, third trimester           O14.13  [redacted] weeks gestation of pregnancy  Z3A.30  Pre-existing diabetes, type 2, in pregnancy,   O24.112  second trimester (Insulin )  Anxiety during pregnancy, second trimester     O99.342, F41.9  (Lexapro) ---------------------------------------------------------------------- Fetal Evaluation  Num Of Fetuses:         1  Fetal Heart Rate(bpm):  133  Cardiac Activity:       Observed  Presentation:           Cephalic  Placenta:               Posterior  Amniotic Fluid  AFI FV:      Within normal limits ---------------------------------------------------------------------- Biophysical Evaluation  Amniotic F.V:   Within normal limits       F. Tone:        Observed  F. Movement:    Observed                   Score:          8/8  F. Breathing:   Observed ---------------------------------------------------------------------- Biometry  BPD:      76.5  mm     G. Age:  30w 5d         51  %    CI:        78.66   %    70 - 86                                                          FL/HC:      19.6   %    19.2 - 21.4  HC:      272.8  mm     G. Age:  29w 5d          8  %    HC/AC:      1.10        0.99 - 1.21  AC:      248.2  mm     G. Age:  29w 0d         13  %    FL/BPD:     70.1   %    71 -  87  FL:       53.6  mm     G. Age:  28w 3d        3.5  %    FL/AC:      21.6   %    20 - 24  Est. FW:    1323  gm    2 lb 15 oz       8  % ---------------------------------------------------------------------- OB History  Blood Type:   A+  Gravidity:    2          SAB:   1  Living:       0 ---------------------------------------------------------------------- Gestational Age  U/S Today:     29w 3d                                        EDD:   06/07/24  Best:          30w 2d     Det. By:  Early Ultrasound         EDD:   06/01/24                                      (  10/26/23) ---------------------------------------------------------------------- Anatomy  Cranium:               Previously seen        Aortic Arch:            Not well visualized  Cavum:                 Previously seen        Ductal Arch:            Previously seen  Ventricles:            Previously seen        Diaphragm:              Appears normal  Choroid Plexus:        Previously seen        Stomach:                Appears normal, left                                                                        sided  Cerebellum:            Not well visualized    Abdomen:                Previously seen  Posterior Fossa:       Not well visualized    Abdominal Wall:         Not well visualized  Face:                  Not well visualized    Cord Vessels:           Not well visualized  Lips:                  Not well visualized    Kidneys:                Appear normal  Thoracic:              Previously seen        Bladder:                Appears normal  Heart:                 Not well visualized    Spine:                  Previously seen  RVOT:                  Not well visualized    Upper Extremities:      Previously seen  LVOT:                  Not well visualized    Lower Extremities:      Previously seen ---------------------------------------------------------------------- Doppler - Fetal Vessels  Umbilical Artery  ADFV    RDFV                                                                Yes      No ---------------------------------------------------------------------- Cervix Uterus Adnexa  Cervix  Not visualized (advanced GA >24wks)  Adnexa  No abnormality visualized ---------------------------------------------------------------------- Impression  Follow up growth due to preeclampsia with severe features  Normal interval growth with measurements consistent with  fetal growth restriction  Good fetal movement and amniotic fluid volume  BPP 8/8  The UAD are intermittently absent with >50% demonstratng  AEDF but no evidence of REDF.  I discussed this case with Dr. Zina. recommend twice weekly  UAD/BPP with two times daily NST.  Repeat growth in 3 weeks  Plan for delivery at 34 week. ----------------------------------------------------------------------               Nathanel Fetters, MD Electronically Signed Final Report   03/25/2024 05:31 pm ----------------------------------------------------------------------   US  MFM UA CORD DOPPLER Result Date: 03/25/2024 ----------------------------------------------------------------------  OBSTETRICS REPORT                       (Signed Final 03/25/2024 05:31 pm) ---------------------------------------------------------------------- Patient Info  ID #:       [id number]                          D.O.B.:  April 25, 1991 (32 yrs)(F)  Name:       Summer Campbell                 Visit Date: 03/25/2024 10:29 am ---------------------------------------------------------------------- Performed By  Attending:        Nathanel Fetters      Ref. Address:     [address]                    [place]                                                             [place], [place]                                                             [address]  Performed By:     Burnard CROME Campbell          Secondary Phy.:   [facility]                    RD[facility]                                                              Care  Referred  By:      CHARLIE                [facility]                    [place]                               [facility] ---------------------------------------------------------------------- Orders  #  Description                           Code        Ordered By  1  US  MFM OB FOLLOW UP                   76816.01    CHARLIE PICKENS  2  US  MFM UA CORD DOPPLER                76820.02    CHARLIE PICKENS  3  US  MFM FETAL BPP WO NON               76819.01    CHARLIE PICKENS     STRESS ----------------------------------------------------------------------  #  Order #                     Accession #                Episode #  1  [id number]                   [id number]                 [id number]  2  [id number]                   [id number]                 [id number]  3  [id number]                   [id number]                 [id number] ---------------------------------------------------------------------- Indications  Severe preeclampsia, third trimester           O14.13  [redacted] weeks gestation of pregnancy                Z3A.30  Pre-existing diabetes, type 2, in pregnancy,   O24.112  second trimester (Insulin )  Anxiety during pregnancy, second trimester     O99.342, F41.9  (Lexapro) ---------------------------------------------------------------------- Fetal Evaluation  Num Of Fetuses:         1  Fetal Heart Rate(bpm):  133  Cardiac Activity:       Observed  Presentation:           Cephalic  Placenta:               Posterior  Amniotic Fluid  AFI FV:      Within normal limits ---------------------------------------------------------------------- Biophysical Evaluation  Amniotic F.V:   Within normal limits       F. Tone:        Observed  F. Movement:    Observed                   Score:          8/8  F. Breathing:  Observed ---------------------------------------------------------------------- Biometry  BPD:      76.5  mm     G. Age:  30w 5d         51  %    CI:        78.66   %    70 - 86                                                           FL/HC:      19.6   %    19.2 - 21.4  HC:      272.8  mm     G. Age:  29w 5d          8  %    HC/AC:      1.10        0.99 - 1.21  AC:      248.2  mm     G. Age:  29w 0d         13  %    FL/BPD:     70.1   %    71 - 87  FL:       53.6  mm     G. Age:  28w 3d        3.5  %    FL/AC:      21.6   %    20 - 24  Est. FW:    1323  gm    2 lb 15 oz       8  % ---------------------------------------------------------------------- OB History  Blood Type:   A+  Gravidity:    2          SAB:   1  Living:       0 ---------------------------------------------------------------------- Gestational Age  U/S Today:     29w 3d                                        EDD:   06/07/24  Best:          30w 2d     Det. By:  Early Ultrasound         EDD:   06/01/24                                      (10/26/23) ---------------------------------------------------------------------- Anatomy  Cranium:               Previously seen        Aortic Arch:            Not well visualized  Cavum:                 Previously seen        Ductal Arch:            Previously seen  Ventricles:            Previously seen        Diaphragm:              Appears normal  Choroid Plexus:        Previously seen  Stomach:                Appears normal, left                                                                        sided  Cerebellum:            Not well visualized    Abdomen:                Previously seen  Posterior Fossa:       Not well visualized    Abdominal Wall:         Not well visualized  Face:                  Not well visualized    Cord Vessels:           Not well visualized  Lips:                  Not well visualized    Kidneys:                Appear normal  Thoracic:              Previously seen        Bladder:                Appears normal  Heart:                 Not well visualized    Spine:                  Previously seen  RVOT:                  Not well visualized    Upper Extremities:       Previously seen  LVOT:                  Not well visualized    Lower Extremities:      Previously seen ---------------------------------------------------------------------- Doppler - Fetal Vessels  Umbilical Artery                                                              ADFV    RDFV                                                                Yes      No ---------------------------------------------------------------------- Cervix Uterus Adnexa  Cervix  Not visualized (advanced GA >24wks)  Adnexa  No abnormality visualized ---------------------------------------------------------------------- Impression  Follow up growth due to preeclampsia with severe features  Normal interval growth with measurements consistent with  fetal growth restriction  Good fetal movement and amniotic fluid volume  BPP 8/8  The UAD are intermittently absent with >50% demonstratng  AEDF but no evidence of REDF.  I discussed this case with Dr. Zina. recommend twice weekly  UAD/BPP with two times daily NST.  Repeat growth in 3 weeks  Plan for delivery at 34 week. ----------------------------------------------------------------------               Nathanel Fetters, MD Electronically Signed Final Report   03/25/2024 05:31 pm ----------------------------------------------------------------------   US  MFM FETAL BPP WO NON STRESS Result Date: 03/25/2024 ----------------------------------------------------------------------  OBSTETRICS REPORT                       (Signed Final 03/25/2024 05:31 pm) ---------------------------------------------------------------------- Patient Info  ID #:       [id number]                          D.O.B.:  05/27/1991 (32 yrs)(F)  Name:       Summer CROME Curl                 Visit Date: 03/25/2024 10:29 am ---------------------------------------------------------------------- Performed By  Attending:        Nathanel Fetters      Ref. Address:     [address]                    [place]                                                              [place], [place]                                                             [address]  Performed By:     Burnard CROME Campbell          Secondary Phy.:   [facility]                    RDMS                                                             Care  Referred By:      BEBE                [facility]                    [place]                               [facility] ---------------------------------------------------------------------- Orders  #  Description                           Code        Ordered By  1  US  MFM OB FOLLOW UP  23183.98    CHARLIE PICKENS  2  US  MFM UA CORD DOPPLER                76820.02    CHARLIE PICKENS  3  US  MFM FETAL BPP WO NON               76819.01    CHARLIE PICKENS     STRESS ----------------------------------------------------------------------  #  Order #                     Accession #                Episode #  1  [id number]                   [id number]                 [id number]  2  [id number]                   [id number]                 [id number]  3  [id number]                   [id number]                 [id number] ---------------------------------------------------------------------- Indications  Severe preeclampsia, third trimester           O14.13  [redacted] weeks gestation of pregnancy                Z3A.30  Pre-existing diabetes, type 2, in pregnancy,   O24.112  second trimester (Insulin )  Anxiety during pregnancy, second trimester     O99.342, F41.9  (Lexapro) ---------------------------------------------------------------------- Fetal Evaluation  Num Of Fetuses:         1  Fetal Heart Rate(bpm):  133  Cardiac Activity:       Observed  Presentation:           Cephalic  Placenta:               Posterior  Amniotic Fluid  AFI FV:      Within normal limits ---------------------------------------------------------------------- Biophysical Evaluation  Amniotic F.V:   Within normal limits       F. Tone:         Observed  F. Movement:    Observed                   Score:          8/8  F. Breathing:   Observed ---------------------------------------------------------------------- Biometry  BPD:      76.5  mm     G. Age:  30w 5d         51  %    CI:        78.66   %    70 - 86                                                          FL/HC:      19.6   %    19.2 - 21.4  HC:      272.8  mm     G. Age:  29w 5d  8  %    HC/AC:      1.10        0.99 - 1.21  AC:      248.2  mm     G. Age:  29w 0d         13  %    FL/BPD:     70.1   %    71 - 87  FL:       53.6  mm     G. Age:  28w 3d        3.5  %    FL/AC:      21.6   %    20 - 24  Est. FW:    1323  gm    2 lb 15 oz       8  % ---------------------------------------------------------------------- OB History  Blood Type:   A+  Gravidity:    2          SAB:   1  Living:       0 ---------------------------------------------------------------------- Gestational Age  U/S Today:     29w 3d                                        EDD:   06/07/24  Best:          30w 2d     Det. By:  Early Ultrasound         EDD:   06/01/24                                      (10/26/23) ---------------------------------------------------------------------- Anatomy  Cranium:               Previously seen        Aortic Arch:            Not well visualized  Cavum:                 Previously seen        Ductal Arch:            Previously seen  Ventricles:            Previously seen        Diaphragm:              Appears normal  Choroid Plexus:        Previously seen        Stomach:                Appears normal, left                                                                        sided  Cerebellum:            Not well visualized    Abdomen:                Previously seen  Posterior Fossa:       Not well visualized    Abdominal Wall:  Not well visualized  Face:                  Not well visualized    Cord Vessels:           Not well visualized  Lips:                  Not well visualized     Kidneys:                Appear normal  Thoracic:              Previously seen        Bladder:                Appears normal  Heart:                 Not well visualized    Spine:                  Previously seen  RVOT:                  Not well visualized    Upper Extremities:      Previously seen  LVOT:                  Not well visualized    Lower Extremities:      Previously seen ---------------------------------------------------------------------- Doppler - Fetal Vessels  Umbilical Artery                                                              ADFV    RDFV                                                                Yes      No ---------------------------------------------------------------------- Cervix Uterus Adnexa  Cervix  Not visualized (advanced GA >24wks)  Adnexa  No abnormality visualized ---------------------------------------------------------------------- Impression  Follow up growth due to preeclampsia with severe features  Normal interval growth with measurements consistent with  fetal growth restriction  Good fetal movement and amniotic fluid volume  BPP 8/8  The UAD are intermittently absent with >50% demonstratng  AEDF but no evidence of REDF.  I discussed this case with Dr. Zina. recommend twice weekly  UAD/BPP with two times daily NST.  Repeat growth in 3 weeks  Plan for delivery at 34 week. ----------------------------------------------------------------------               Nathanel Fetters, MD Electronically Signed Final Report   03/25/2024 05:31 pm ----------------------------------------------------------------------     Medications:  Scheduled  aspirin   162 mg Oral Daily   escitalopram  20 mg Oral Daily   insulin  aspart  0-15 Units Subcutaneous TID PC   insulin  aspart  5 Units Subcutaneous TID WC   insulin  glargine-yfgn  10 Units Subcutaneous Daily   labetalol   800 mg Oral TID   metFORMIN   1,000 mg Oral BID WC   NIFEdipine   60 mg Oral BID   pantoprazole  20 mg Oral  Daily  prenatal multivitamin  1 tablet Oral Q1200   sodium chloride  flush  3 mL Intravenous Q12H   valACYclovir  500 mg Oral BID   I have reviewed the patient's current medications.   Glenys GORMAN Birk, MD 04/12/2024,7:48 AM

## 2024-04-13 ENCOUNTER — Encounter (HOSPITAL_COMMUNITY): Payer: Self-pay

## 2024-04-13 ENCOUNTER — Encounter (HOSPITAL_COMMUNITY): Payer: Self-pay | Admitting: Obstetrics and Gynecology

## 2024-04-13 DIAGNOSIS — Z3A32 32 weeks gestation of pregnancy: Secondary | ICD-10-CM | POA: Diagnosis not present

## 2024-04-13 DIAGNOSIS — O113 Pre-existing hypertension with pre-eclampsia, third trimester: Secondary | ICD-10-CM | POA: Diagnosis not present

## 2024-04-13 LAB — COMPREHENSIVE METABOLIC PANEL WITH GFR
ALT: 30 U/L (ref 0–44)
ALT: 26 U/L (ref 0–44)
AST: 24 U/L (ref 15–41)
AST: 20 U/L (ref 15–41)
Albumin: 2.5 g/dL — ABNORMAL LOW (ref 3.5–5.0)
Albumin: 2.4 g/dL — ABNORMAL LOW (ref 3.5–5.0)
Alkaline Phosphatase: 74 U/L (ref 38–126)
Alkaline Phosphatase: 70 U/L (ref 38–126)
Anion gap: 11 (ref 5–15)
Anion gap: 12 (ref 5–15)
BUN: 20 mg/dL (ref 6–20)
BUN: 17 mg/dL (ref 6–20)
CO2: 21 mmol/L — ABNORMAL LOW (ref 22–32)
CO2: 20 mmol/L — ABNORMAL LOW (ref 22–32)
Calcium: 9.1 mg/dL (ref 8.9–10.3)
Calcium: 8.5 mg/dL — ABNORMAL LOW (ref 8.9–10.3)
Chloride: 106 mmol/L (ref 98–111)
Chloride: 105 mmol/L (ref 98–111)
Creatinine, Ser: 0.88 mg/dL (ref 0.44–1.00)
Creatinine, Ser: 0.67 mg/dL (ref 0.44–1.00)
GFR, Estimated: >60 mL/min (ref >60)
GFR, Estimated: >60 mL/min (ref >60)
Glucose, Bld: 123 mg/dL — ABNORMAL HIGH (ref 70–99)
Glucose, Bld: 100 mg/dL — ABNORMAL HIGH (ref 70–99)
Potassium: 4.2 mmol/L (ref 3.5–5.1)
Potassium: 4.3 mmol/L (ref 3.5–5.1)
Sodium: 138 mmol/L (ref 135–145)
Sodium: 137 mmol/L (ref 135–145)
Total Bilirubin: 0.3 mg/dL (ref 0.0–1.2)
Total Bilirubin: 0.3 mg/dL (ref 0.0–1.2)
Total Protein: 6.5 g/dL (ref 6.5–8.1)
Total Protein: 6 g/dL — ABNORMAL LOW (ref 6.5–8.1)

## 2024-04-13 LAB — CBC
HCT: 36.6 % (ref 36.0–46.0)
HCT: 37 % (ref 36.0–46.0)
Hemoglobin: 12.2 g/dL (ref 12.0–15.0)
Hemoglobin: 12 g/dL (ref 12.0–15.0)
MCH: 29.3 pg (ref 26.0–34.0)
MCH: 28.4 pg (ref 26.0–34.0)
MCHC: 33.3 g/dL (ref 30.0–36.0)
MCHC: 32.4 g/dL (ref 30.0–36.0)
MCV: 87.8 fL (ref 80.0–100.0)
MCV: 87.7 fL (ref 80.0–100.0)
Platelets: 268 K/uL (ref 150–400)
Platelets: 260 K/uL (ref 150–400)
RBC: 4.17 MIL/uL (ref 3.87–5.11)
RBC: 4.22 MIL/uL (ref 3.87–5.11)
RDW: 13.7 % (ref 11.5–15.5)
RDW: 13.6 % (ref 11.5–15.5)
WBC: 18 K/uL — ABNORMAL HIGH (ref 4.0–10.5)
WBC: 17.6 K/uL — ABNORMAL HIGH (ref 4.0–10.5)
nRBC: 0 % (ref 0.0–0.2)
nRBC: 0 % (ref 0.0–0.2)

## 2024-04-13 LAB — GLUCOSE, CAPILLARY
Glucose-Capillary: 97 mg/dL (ref 70–99)
Glucose-Capillary: 143 mg/dL — ABNORMAL HIGH (ref 70–99)
Glucose-Capillary: 99 mg/dL (ref 70–99)

## 2024-04-13 LAB — TYPE AND SCREEN
ABO/RH(D): A POS
Antibody Screen: NEGATIVE

## 2024-04-13 MED ORDER — PENICILLIN G POT IN DEXTROSE 60000 UNIT/ML IV SOLN
3.0000 10*6.[IU] | INTRAVENOUS | Status: DC
  Administered 2024-04-13 – 2024-04-14 (×5): 3 10*6.[IU] via INTRAVENOUS
  Filled 2024-04-13 (×5): qty 50

## 2024-04-13 MED ORDER — SODIUM CHLORIDE 0.9 % IV SOLN
5.0000 10*6.[IU] | Freq: Once | INTRAVENOUS | Status: AC
  Administered 2024-04-13: 5 10*6.[IU] via INTRAVENOUS
  Filled 2024-04-13: qty 5

## 2024-04-13 MED ORDER — MAGNESIUM SULFATE BOLUS VIA INFUSION
4.0000 g | Freq: Once | INTRAVENOUS | Status: AC
  Administered 2024-04-13: 4 g via INTRAVENOUS
  Filled 2024-04-13: qty 1000

## 2024-04-13 MED ORDER — HYDRALAZINE HCL 20 MG/ML IJ SOLN: 5.0000 mg | INTRAMUSCULAR | Status: DC | PRN

## 2024-04-13 MED ORDER — LACTATED RINGERS IV SOLN: INTRAVENOUS | Status: AC

## 2024-04-13 MED ORDER — LIDOCAINE HCL (PF) 1 % IJ SOLN: 30.0000 mL | INTRAMUSCULAR | Status: DC | PRN

## 2024-04-13 MED ORDER — OXYTOCIN BOLUS FROM INFUSION: 333.0000 mL | Freq: Once | INTRAVENOUS | Status: DC

## 2024-04-13 MED ORDER — OXYCODONE-ACETAMINOPHEN 5-325 MG PO TABS: 1.0000 | ORAL_TABLET | ORAL | Status: DC | PRN

## 2024-04-13 MED ORDER — HYDRALAZINE HCL 20 MG/ML IJ SOLN: 10.0000 mg | INTRAMUSCULAR | Status: DC | PRN

## 2024-04-13 MED ORDER — INSULIN ASPART 100 UNIT/ML IJ SOLN
0.0000 [IU] | INTRAMUSCULAR | Status: DC
  Administered 2024-04-13: 1 [IU] via SUBCUTANEOUS

## 2024-04-13 MED ORDER — OXYTOCIN-SODIUM CHLORIDE 30-0.9 UT/500ML-% IV SOLN
2.5000 [IU]/h | INTRAVENOUS | Status: DC
  Filled 2024-04-13: qty 500

## 2024-04-13 MED ORDER — OXYCODONE-ACETAMINOPHEN 5-325 MG PO TABS: 2.0000 | ORAL_TABLET | ORAL | Status: DC | PRN

## 2024-04-13 MED ORDER — SOD CITRATE-CITRIC ACID 500-334 MG/5ML PO SOLN
30.0000 mL | ORAL | Status: DC | PRN
Start: 2024-04-13 — End: 2024-04-14
  Administered 2024-04-14: 30 mL via ORAL
  Filled 2024-04-13: qty 30

## 2024-04-13 MED ORDER — MAGNESIUM SULFATE 40 GM/1000ML IV SOLN
2.0000 g/h | INTRAVENOUS | Status: DC
  Filled 2024-04-13 (×2): qty 1000

## 2024-04-13 MED ORDER — ONDANSETRON HCL 4 MG/2ML IJ SOLN
4.0000 mg | Freq: Four times a day (QID) | INTRAMUSCULAR | Status: DC | PRN
  Administered 2024-04-14: 4 mg via INTRAVENOUS
  Filled 2024-04-13: qty 2

## 2024-04-13 MED ORDER — MISOPROSTOL 25 MCG QUARTER TABLET
25.0000 ug | ORAL_TABLET | Freq: Once | ORAL | Status: AC
  Administered 2024-04-13: 25 ug via VAGINAL
  Filled 2024-04-13: qty 1

## 2024-04-13 MED ORDER — MISOPROSTOL 50MCG HALF TABLET
50.0000 ug | ORAL_TABLET | Freq: Once | ORAL | Status: AC
  Administered 2024-04-13: 50 ug via BUCCAL
  Filled 2024-04-13: qty 1

## 2024-04-13 MED ORDER — ACETAMINOPHEN-CAFFEINE 500-65 MG PO TABS
2.0000 | ORAL_TABLET | Freq: Four times a day (QID) | ORAL | Status: DC | PRN
  Administered 2024-04-14: 2 via ORAL
  Filled 2024-04-13 (×2): qty 2

## 2024-04-13 MED ORDER — TERBUTALINE SULFATE 1 MG/ML IJ SOLN: 0.2500 mg | Freq: Once | INTRAMUSCULAR | Status: DC | PRN

## 2024-04-13 MED ORDER — MISOPROSTOL 25 MCG QUARTER TABLET
25.0000 ug | ORAL_TABLET | ORAL | Status: DC | PRN
  Administered 2024-04-13 (×2): 25 ug via VAGINAL
  Filled 2024-04-13 (×2): qty 1

## 2024-04-13 MED ORDER — LABETALOL HCL 5 MG/ML IV SOLN: 20.0000 mg | INTRAVENOUS | Status: DC | PRN

## 2024-04-13 MED ORDER — ACETAMINOPHEN 325 MG PO TABS
650.0000 mg | ORAL_TABLET | ORAL | Status: DC | PRN
Start: 2024-04-13 — End: 2024-04-14
  Administered 2024-04-13: 650 mg via ORAL
  Filled 2024-04-13: qty 2

## 2024-04-13 MED ORDER — LABETALOL HCL 5 MG/ML IV SOLN: 40.0000 mg | INTRAVENOUS | Status: DC | PRN

## 2024-04-13 NOTE — Progress Notes (Signed)
 Patient ID: Summer Campbell, female   DOB: August 11, 1990, 33 y.o.   MRN: [id number] FACULTY PRACTICE ANTEPARTUM(COMPREHENSIVE) NOTE  Summer Campbell is a 33 y.o. G2P0010 at [redacted]w[redacted]d by best clinical estimate who is admitted for preeclampsia with severe features superimposed on chronic hypertension, T2DM, fetal growth restriction with absent end-diastolic flow .   Fetal presentation is cephalic. Length of Stay:  20  Days  ASSESSMENT: Principal Problem:   Chronic hypertension with superimposed preeclampsia Active Problems:   Herpes simplex infection of perianal skin   Preexisting diabetes complicating pregnancy, antepartum   Severe pre-eclampsia   IUGR (intrauterine growth restriction) affecting care of mother   PLAN: Chronic hypertension with superimposed preeclampsia Currently on Procardia  60 twice daily Continue labetalol  800 3 times daily SBP in severe range today.  Reviewed this in light of FGR and iAEDF. Recommended delivery today. Patient agreeable. Discussed Magnesium in labor and for 24 hours after delivery.   IUGR (intrauterine growth restriction) affecting care of mother Estimated fetal weight at 8 percentile Absent end-diastolic flow Last BPP 8 of 8 Fetal heart tracing is category 1  Herpes simplex infection of perianal skin Continue Valtrex for prophylaxis  Preexisting diabetes complicating pregnancy, antepartum Currently on Semglee  10 nightly and short acting 5 with meals Continues on metformin  twice daily CBGs are normal  Severe pre-eclampsia See above   Subjective: Feels well today. Denies HA/BV/RUQ pain.  Patient reports the fetal movement as active. Patient reports uterine contraction  activity as none. Patient reports  vaginal bleeding as none. Patient describes fluid per vagina as None.  Vitals:  Blood pressure (!) 157/68, pulse 82, temperature 98.1 F (36.7 C), temperature source Oral, resp. rate 18, height 5' 4 (1.626 m), weight (!) 152.5 kg, SpO2  98%. Physical Examination:  General appearance - alert, well appearing, and in no distress Chest - normal effort Abdomen - gravid, non-tender Fundal Height:  size equals dates Extremities: edema 2+  Membranes:intact  Fetal Monitoring:  Baseline: 130 bpm, Variability: Good {> 6 bpm), Accelerations: Reactive, and Decelerations: Absent  Labs:  Results for orders placed or performed during the hospital encounter of 03/24/24 (from the past 24 hours)  Glucose, capillary   Collection Time: 04/12/24  9:41 PM  Result Value Ref Range   Glucose-Capillary 165 (H) 70 - 99 mg/dL  Glucose, capillary   Collection Time: 04/13/24  8:14 AM  Result Value Ref Range   Glucose-Capillary 97 70 - 99 mg/dL    Imaging Studies:    US  MFM UA CORD DOPPLER Result Date: 04/12/2024 ----------------------------------------------------------------------  OBSTETRICS REPORT                       (Signed Final 04/12/2024 09:34 am) ---------------------------------------------------------------------- Patient Info  ID #:       [id number]                          D.O.B.:  07/13/1990 (33 yrs)(F)  Name:       Summer Campbell                 Visit Date: 04/12/2024 07:38 am ---------------------------------------------------------------------- Performed By  Attending:        Delora Smaller DO       Ref. Address:     [address]  [place], [place]                                                             [address]  Performed By:     Powell Breen BS       Secondary Phy.:   [facility]                    RDMS                                                             Care  Referred By:      BEBE                [facility] ---------------------------------------------------------------------- Orders  #  Description                           Code        Ordered By  1  US  MFM UA CORD  DOPPLER                76820.02    UGONNA ANYANWU  2  US  MFM FETAL BPP WO NON               76819.01    UGONNA ANYANWU     STRESS ----------------------------------------------------------------------  #  Order #                     Accession #                Episode #  1  [id number]                   [id number]                 [id number]  2  [id number]                   [id number]                 [id number] ---------------------------------------------------------------------- Indications  Maternal care for known or suspected poor      O36.5930  fetal growth, third trimester, single or  unspecified fetus IUGR  Severe preeclampsia, third trimester           O14.13  Pre-existing diabetes, type 2, in pregnancy,   O24.113  third trimester (insulin )  Pre-existing essential hypertension            O10.013  complicating pregnancy, third trimester  (procardia  and labetalol )  Obesity complicating pregnancy, third          O99.213  trimester (BMI 51.7)  Anxiety during pregnancy, third trimester      O99.343, F41.9  Encounter for antenatal screening,             Z36.9  unspecified  [redacted] weeks gestation of pregnancy                Z3A.32 ---------------------------------------------------------------------- Fetal Evaluation  Num Of Fetuses:         1  Cardiac Activity:       Observed  Presentation:           Cephalic  Placenta:               Posterior  P. Cord Insertion:      Previously seen  Amniotic Fluid  AFI FV:      Within normal limits  AFI Sum(cm)     %Tile       Largest Pocket(cm)  15.5            55          6.9  RUQ(cm)       RLQ(cm)       LUQ(cm)        LLQ(cm)  4.6           4             6.9            0 ---------------------------------------------------------------------- Biophysical Evaluation  Amniotic F.V:   Pocket => 2 cm             F. Tone:        Observed  F. Movement:    Observed                   Score:          8/8  F. Breathing:   Observed  ---------------------------------------------------------------------- OB History  Blood Type:   A+  Gravidity:    2          SAB:   1  Living:       0 ---------------------------------------------------------------------- Gestational Age  Best:          32w 6d     Det. By:  Early Ultrasound         EDD:   06/01/24                                      (10/26/23) ---------------------------------------------------------------------- Anatomy  Cranium:               Appears normal         Stomach:                Appears normal, left                                                                        sided  Ventricles:            Appears normal         Kidneys:                Appear normal  Thoracic:              Appears normal         Bladder:  Appears normal  Diaphragm:             Appears normal ---------------------------------------------------------------------- Doppler - Fetal Vessels  Umbilical Artery   S/D     %tile      RI    %tile      PI    %tile     PSV    ADFV    RDFV                                                     (cm/s)    6.7   > 97.5     0.9   > 97.5       2   > 97.5     36.4     Yes      No ---------------------------------------------------------------------- Cervix Uterus Adnexa  Cervix  Not visualized (advanced GA >24wks)  Uterus  No abnormality visualized.  Right Ovary  Not visualized.  Left Ovary  Not visualized.  Cul De Sac  No free fluid seen.  Adnexa  No abnormality visualized ---------------------------------------------------------------------- Comments  Sonographic findings  Single intrauterine pregnancy at 32w 6d.  Fetal cardiac activity:  Observed and appears normal.  Presentation: Cephalic.  Interval fetal anatomy appears normal.  Amniotic fluid: Within normal limits.  MVP: 6.9 cm.  Placenta: Posterior.  Umbilical artery dopplers findings:  -S/D:6.7 which are elevated at this gestational age.  -Absent end-diastolic flow: Yes (1 out of 3).  -Reversed end-diastolic  flow:  No.  BPP 8/8.  Recommendations  - See Epic for complete consult note to be done later today  There are limitations of prenatal ultrasound such as the  inability to detect certain abnormalities due to poor  visualization. Various factors such as fetal position,  gestational age and maternal body habitus may increase the  difficulty in visualizing the fetal anatomy. ----------------------------------------------------------------------                   Delora Smaller, DO Electronically Signed Final Report   04/12/2024 09:34 am ----------------------------------------------------------------------   US  MFM FETAL BPP WO NON STRESS Result Date: 04/12/2024 ----------------------------------------------------------------------  OBSTETRICS REPORT                       (Signed Final 04/12/2024 09:34 am) ---------------------------------------------------------------------- Patient Info  ID #:       [id number]                          D.O.B.:  08-30-1990 (33 yrs)(F)  Name:       Summer Campbell                 Visit Date: 04/12/2024 07:38 am ---------------------------------------------------------------------- Performed By  Attending:        Delora Smaller DO       Ref. Address:     [address]                                                             [place], [place]  [address]  Performed By:     Powell Breen BS       Secondary Phy.:   [facility]                    RDMS                                                             Care  Referred By:      BEBE                [facility] ---------------------------------------------------------------------- Orders  #  Description                           Code        Ordered By  1  US  MFM UA CORD DOPPLER                76820.02    UGONNA ANYANWU  2  US  MFM FETAL BPP WO NON               76819.01    UGONNA  ANYANWU     STRESS ----------------------------------------------------------------------  #  Order #                     Accession #                Episode #  1  [id number]                   [id number]                 [id number]  2  [id number]                   [id number]                 [id number] ---------------------------------------------------------------------- Indications  Maternal care for known or suspected poor      O36.5930  fetal growth, third trimester, single or  unspecified fetus IUGR  Severe preeclampsia, third trimester           O14.13  Pre-existing diabetes, type 2, in pregnancy,   O24.113  third trimester (insulin )  Pre-existing essential hypertension            O10.013  complicating pregnancy, third trimester  (procardia  and labetalol )  Obesity complicating pregnancy, third          O99.213  trimester (BMI 51.7)  Anxiety during pregnancy, third trimester      O99.343, F41.9  Encounter for antenatal screening,             Z36.9  unspecified  [redacted] weeks gestation of pregnancy                Z3A.32 ---------------------------------------------------------------------- Fetal Evaluation  Num Of Fetuses:         1  Cardiac Activity:  Observed  Presentation:           Cephalic  Placenta:               Posterior  P. Cord Insertion:      Previously seen  Amniotic Fluid  AFI FV:      Within normal limits  AFI Sum(cm)     %Tile       Largest Pocket(cm)  15.5            55          6.9  RUQ(cm)       RLQ(cm)       LUQ(cm)        LLQ(cm)  4.6           4             6.9            0 ---------------------------------------------------------------------- Biophysical Evaluation  Amniotic F.V:   Pocket => 2 cm             F. Tone:        Observed  F. Movement:    Observed                   Score:          8/8  F. Breathing:   Observed ---------------------------------------------------------------------- OB History  Blood Type:   A+  Gravidity:    2          SAB:   1  Living:       0  ---------------------------------------------------------------------- Gestational Age  Best:          32w 6d     Det. By:  Early Ultrasound         EDD:   06/01/24                                      (10/26/23) ---------------------------------------------------------------------- Anatomy  Cranium:               Appears normal         Stomach:                Appears normal, left                                                                        sided  Ventricles:            Appears normal         Kidneys:                Appear normal  Thoracic:              Appears normal         Bladder:                Appears normal  Diaphragm:             Appears normal ---------------------------------------------------------------------- Doppler - Fetal Vessels  Umbilical Artery   S/D     %tile      RI    %tile      PI    %  tile     PSV    ADFV    RDFV                                                     (cm/s)    6.7   > 97.5     0.9   > 97.5       2   > 97.5     36.4     Yes      No ---------------------------------------------------------------------- Cervix Uterus Adnexa  Cervix  Not visualized (advanced GA >24wks)  Uterus  No abnormality visualized.  Right Ovary  Not visualized.  Left Ovary  Not visualized.  Cul De Sac  No free fluid seen.  Adnexa  No abnormality visualized ---------------------------------------------------------------------- Comments  Sonographic findings  Single intrauterine pregnancy at 32w 6d.  Fetal cardiac activity:  Observed and appears normal.  Presentation: Cephalic.  Interval fetal anatomy appears normal.  Amniotic fluid: Within normal limits.  MVP: 6.9 cm.  Placenta: Posterior.  Umbilical artery dopplers findings:  -S/D:6.7 which are elevated at this gestational age.  -Absent end-diastolic flow: Yes (1 out of 3).  -Reversed end-diastolic flow:  No.  BPP 8/8.  Recommendations  - See Epic for complete consult note to be done later today  There are limitations of prenatal ultrasound such as  the  inability to detect certain abnormalities due to poor  visualization. Various factors such as fetal position,  gestational age and maternal body habitus may increase the  difficulty in visualizing the fetal anatomy. ----------------------------------------------------------------------                   Delora Smaller, DO Electronically Signed Final Report   04/12/2024 09:34 am ----------------------------------------------------------------------   US  MFM UA CORD DOPPLER Result Date: 04/10/2024 ----------------------------------------------------------------------  OBSTETRICS REPORT                        (Signed Final 04/10/2024 08:20 pm) ---------------------------------------------------------------------- Patient Info  ID #:       [id number]                          D.O.B.:  27-Aug-1990 (32 yrs)(F)  Name:       Summer Campbell                 Visit Date: 04/09/2024 08:10 am ---------------------------------------------------------------------- Performed By  Attending:        Nathanel Fetters      Ref. Address:      [address]                    [place]                                                              [place], [place]  [address]  Performed By:     Metta Ar          Secondary Phy.:    [facility]                    RDMS                                                              Care  Referred By:      BEBE                [facility] ---------------------------------------------------------------------- Orders  #  Description                           Code        Ordered By  1  US  MFM UA CORD DOPPLER                76820.02    UGONNA ANYANWU  2  US  MFM FETAL BPP WO NON               76819.01    UGONNA ANYANWU     STRESS ----------------------------------------------------------------------  #  Order #                     Accession #                 Episode #  1  [id number]                   [id number]                 [id number]  2  [id number]                   [id number]                 [id number] ---------------------------------------------------------------------- Indications  Maternal care for known or suspected poor       O36.5930  fetal growth, third trimester, single or  unspecified fetus IUGR  Severe preeclampsia, third trimester            O14.13  [redacted] weeks gestation of pregnancy                 Z3A.32  Pre-existing diabetes, type 2, in pregnancy,    O24.113  third trimester (insulin )  Pre-existing essential hypertension             O10.013  complicating pregnancy, third trimester  (procardia  and labetalol )  Obesity complicating pregnancy, third           O99.213  trimester (BMI 51.7)  Anxiety during pregnancy, third trimester       O99.343, F41.9 ---------------------------------------------------------------------- Fetal Evaluation  Num Of Fetuses:          1  Fetal Heart Rate(bpm):   132  Cardiac Activity:  Observed  Presentation:            Cephalic  Placenta:                Posterior  P. Cord Insertion:       Previously seen  Amniotic Fluid  AFI FV:      Within normal limits  AFI Sum(cm)     %Tile       Largest Pocket(cm)  15              53          5.2  RUQ(cm)       RLQ(cm)       LUQ(cm)        LLQ(cm)  1.2           5.2           3.8            4.8 ---------------------------------------------------------------------- Biophysical Evaluation  Amniotic F.V:   Pocket => 2 cm             F. Tone:         Observed  F. Movement:    Observed                   Score:           8/8  F. Breathing:   Observed ---------------------------------------------------------------------- OB History  Blood Type:   A+  Gravidity:    2          SAB:   1  Living:       0 ---------------------------------------------------------------------- Gestational Age  Best:          32w 3d     Det. By:  Early Ultrasound         EDD:   06/01/24                                       (10/26/23) ---------------------------------------------------------------------- Doppler - Fetal Vessels  Umbilical Artery    S/D    %tile      RI    %tile      PI    %tile     PSV    ADFV    RDFV                                                     (cm/s)    7.4   > 97.5     0.9   > 97.5     1.8   > 97.5     28.4     Yes      No ---------------------------------------------------------------------- Impression  Antenatal testing due to IUGR with an EFW 8th%  Biophysical profile 8/8 with good fetal movement and  amniotic fluid volume  UA Dopplers are elevated with intermittent absent EDF there  is no evidence of REDF ---------------------------------------------------------------------- Recommendations  Continue 2 x daily NST  Twice weekly UAD with BPP  Repet growth this week  Consider delivery for non-reassuring NST  Delivery by 34 weeks ----------------------------------------------------------------------              Nathanel Fetters, MD Electronically Signed Final Report   04/10/2024 08:20 pm ----------------------------------------------------------------------   US  MFM FETAL BPP  WO NON STRESS Result Date: 04/10/2024 ----------------------------------------------------------------------  OBSTETRICS REPORT                        (Signed Final 04/10/2024 08:20 pm) ---------------------------------------------------------------------- Patient Info  ID #:       [id number]                          D.O.B.:  1990-11-26 (32 yrs)(F)  Name:       Summer Campbell                 Visit Date: 04/09/2024 08:10 am ---------------------------------------------------------------------- Performed By  Attending:        Nathanel Fetters      Ref. Address:      [address]                    [place]                                                              [place], [place]                                                              [address]  Performed By:     Metta Ar          Secondary Phy.:    [facility]                     RDMS                                                              Care  Referred By:      BEBE                [facility] ---------------------------------------------------------------------- Orders  #  Description                           Code        Ordered By  1  US  MFM UA CORD DOPPLER                76820.02    UGONNA ANYANWU  2  US  MFM FETAL BPP WO NON               23180.98    UGONNA ANYANWU     STRESS ----------------------------------------------------------------------  #  Order #  Accession #                Episode #  1  [id number]                   [id number]                 [id number]  2  [id number]                   [id number]                 [id number] ---------------------------------------------------------------------- Indications  Maternal care for known or suspected poor       O36.5930  fetal growth, third trimester, single or  unspecified fetus IUGR  Severe preeclampsia, third trimester            O14.13  [redacted] weeks gestation of pregnancy                 Z3A.32  Pre-existing diabetes, type 2, in pregnancy,    O24.113  third trimester (insulin )  Pre-existing essential hypertension             O10.013  complicating pregnancy, third trimester  (procardia  and labetalol )  Obesity complicating pregnancy, third           O99.213  trimester (BMI 51.7)  Anxiety during pregnancy, third trimester       O99.343, F41.9 ---------------------------------------------------------------------- Fetal Evaluation  Num Of Fetuses:          1  Fetal Heart Rate(bpm):   132  Cardiac Activity:        Observed  Presentation:            Cephalic  Placenta:                Posterior  P. Cord Insertion:       Previously seen  Amniotic Fluid  AFI FV:      Within normal limits  AFI Sum(cm)     %Tile       Largest Pocket(cm)  15              53          5.2  RUQ(cm)       RLQ(cm)       LUQ(cm)         LLQ(cm)  1.2           5.2           3.8            4.8 ---------------------------------------------------------------------- Biophysical Evaluation  Amniotic F.V:   Pocket => 2 cm             F. Tone:         Observed  F. Movement:    Observed                   Score:           8/8  F. Breathing:   Observed ---------------------------------------------------------------------- OB History  Blood Type:   A+  Gravidity:    2          SAB:   1  Living:       0 ---------------------------------------------------------------------- Gestational Age  Best:          32w 3d     Det. By:  Early Ultrasound         EDD:   06/01/24                                      (  10/26/23) ---------------------------------------------------------------------- Doppler - Fetal Vessels  Umbilical Artery    S/D    %tile      RI    %tile      PI    %tile     PSV    ADFV    RDFV                                                     (cm/s)    7.4   > 97.5     0.9   > 97.5     1.8   > 97.5     28.4     Yes      No ---------------------------------------------------------------------- Impression  Antenatal testing due to IUGR with an EFW 8th%  Biophysical profile 8/8 with good fetal movement and  amniotic fluid volume  UA Dopplers are elevated with intermittent absent EDF there  is no evidence of REDF ---------------------------------------------------------------------- Recommendations  Continue 2 x daily NST  Twice weekly UAD with BPP  Repet growth this week  Consider delivery for non-reassuring NST  Delivery by 34 weeks ----------------------------------------------------------------------              Nathanel Fetters, MD Electronically Signed Final Report   04/10/2024 08:20 pm ----------------------------------------------------------------------   US  MFM UA CORD DOPPLER Result Date: 04/05/2024 ----------------------------------------------------------------------  OBSTETRICS REPORT                       (Signed Final 04/05/2024 05:08 pm)  ---------------------------------------------------------------------- Patient Info  ID #:       [id number]                          D.O.B.:  12-24-1990 (32 yrs)(F)  Name:       Summer Campbell                 Visit Date: 04/05/2024 07:16 am ---------------------------------------------------------------------- Performed By  Attending:        Steffan Keys MD         Ref. Address:     [address]                                                             [place], [place]                                                             [address]  Performed By:     Powell Breen BS       Secondary Phy.:   [facility]                    RDMS  Care  Referred By:      BEBE                [facility] ---------------------------------------------------------------------- Orders  #  Description                           Code        Ordered By  1  US  MFM FETAL BPP WO NON               76819.01    CHARLIE PICKENS     STRESS  2  US  MFM UA CORD DOPPLER                76820.02    CHARLIE PICKENS ----------------------------------------------------------------------  #  Order #                     Accession #                Episode #  1  [id number]                   [id number]                 [id number]  2  [id number]                   [id number]                 [id number] ---------------------------------------------------------------------- Indications  Maternal care for known or suspected poor      O36.5930  fetal growth, third trimester, single or  unspecified fetus IUGR  Severe preeclampsia, third trimester           O14.13  Pre-existing diabetes, type 2, in pregnancy,   O24.113  third trimester (insulin )  Pre-existing essential hypertension            O10.013  complicating pregnancy, third trimester  (procardia  and labetalol )  Obesity complicating pregnancy, third          O99.213   trimester (BMI 51.7)  Anxiety during pregnancy, third trimester      O99.343, F41.9  [redacted] weeks gestation of pregnancy                Z3A.31 ---------------------------------------------------------------------- Fetal Evaluation  Num Of Fetuses:         1  Fetal Heart Rate(bpm):  140  Cardiac Activity:       Observed  Presentation:           Cephalic  Placenta:               Posterior  P. Cord Insertion:      Previously seen  Amniotic Fluid  AFI FV:      Within normal limits  AFI Sum(cm)     %Tile       Largest Pocket(cm)  12.9            38          4.8  RUQ(cm)       RLQ(cm)       LUQ(cm)  LLQ(cm)  3.8           0             4.8            4.3 ---------------------------------------------------------------------- Biophysical Evaluation  Amniotic F.V:   Pocket => 2 cm             F. Tone:        Observed  F. Movement:    Observed                   Score:          8/8  F. Breathing:   Observed ---------------------------------------------------------------------- OB History  Blood Type:   A+  Gravidity:    2          SAB:   1  Living:       0 ---------------------------------------------------------------------- Gestational Age  Best:          31w 6d     Det. By:  Early Ultrasound         EDD:   06/01/24                                      (10/26/23) ---------------------------------------------------------------------- Anatomy  Cranium:               Previously seen        Aortic Arch:            Not well visualized  Cavum:                 Previously seen        Ductal Arch:            Previously seen  Ventricles:            Previously seen        Diaphragm:              Appears normal  Choroid Plexus:        Previously seen        Stomach:                Appears normal, left                                                                        sided  Cerebellum:            Not well visualized    Abdomen:                Previously seen  Posterior Fossa:       Not well visualized    Abdominal Wall:          Not well visualized  Face:                  Not well visualized    Cord Vessels:           Not well visualized  Lips:                  Not well visualized    Kidneys:  Appear normal  Thoracic:              Previously seen        Bladder:                Appears normal  Heart:                 Not well visualized    Spine:                  Previously seen  RVOT:                  Not well visualized    Upper Extremities:      Previously seen  LVOT:                  Not well visualized    Lower Extremities:      Previously seen ---------------------------------------------------------------------- Doppler - Fetal Vessels  Umbilical Artery                                                              ADFV    RDFV                                                                Yes      No ---------------------------------------------------------------------- Cervix Uterus Adnexa  Cervix  Not visualized (advanced GA >24wks)  Uterus  No abnormality visualized.  Right Ovary  Not visualized.  Left Ovary  Not visualized.  Cul De Sac  No free fluid seen.  Adnexa  No abnormality visualized ---------------------------------------------------------------------- Comments  This patient has been hospitalized due to chronic  hypertension with superimposed preeclampsia along with  IUGR with intermittent absent end-diastolic flow.  A biophysical profile performed today was 8/8.  The AFI was 12.9 cm.  Vertex presentation.  Doppler studies of the umbilical arteries performed today  showed persistent absent end-diastolic flow.  There were no  signs of reversed end-diastolic flow.  The persistent end-diastolic flow is a new finding as her prior  exam showed intermittent absent end-diastolic flow.  She should continue inpatient management with daily fetal  testing.  Delivery is recommended by 34 weeks.  Delivery is recommended prior to 34 weeks showed reversed  end-diastolic flow be noted, should her preeclampsia worsen,  or  should she have any abnormalities in her [facility] labs. ----------------------------------------------------------------------                   Steffan Keys, MD Electronically Signed Final Report   04/05/2024 05:08 pm ----------------------------------------------------------------------   US  MFM FETAL BPP WO NON STRESS Result Date: 04/05/2024 ----------------------------------------------------------------------  OBSTETRICS REPORT                       (Signed Final 04/05/2024 05:08 pm) ---------------------------------------------------------------------- Patient Info  ID #:       [id number]                          D.O.B.:  06-17-1990 (32 yrs)(F)  Name:  Summer Campbell                 Visit Date: 04/05/2024 07:16 am ---------------------------------------------------------------------- Performed By  Attending:        Steffan Keys MD         Ref. Address:     [address]                                                             [place], [place]                                                             [address]  Performed By:     Powell Breen BS       Secondary Phy.:   [facility]                    RDMS                                                             Care  Referred By:      BEBE                [facility] ---------------------------------------------------------------------- Orders  #  Description                           Code        Ordered By  1  US  MFM FETAL BPP WO NON               76819.01    CHARLIE PICKENS     STRESS  2  US  MFM UA CORD DOPPLER                23179.97    CHARLIE PICKENS ----------------------------------------------------------------------  #  Order #                     Accession #                Episode #  1  [id number]                   [id number]                 [id number]  2  [id number]                   [id number]  [id number]  ---------------------------------------------------------------------- Indications  Maternal care for known or suspected poor      O36.5930  fetal growth, third trimester, single or  unspecified fetus IUGR  Severe preeclampsia, third trimester           O14.13  Pre-existing diabetes, type 2, in pregnancy,   O24.113  third trimester (insulin )  Pre-existing essential hypertension            O10.013  complicating pregnancy, third trimester  (procardia  and labetalol )  Obesity complicating pregnancy, third          O99.213  trimester (BMI 51.7)  Anxiety during pregnancy, third trimester      O99.343, F41.9  [redacted] weeks gestation of pregnancy                Z3A.31 ---------------------------------------------------------------------- Fetal Evaluation  Num Of Fetuses:         1  Fetal Heart Rate(bpm):  140  Cardiac Activity:       Observed  Presentation:           Cephalic  Placenta:               Posterior  P. Cord Insertion:      Previously seen  Amniotic Fluid  AFI FV:      Within normal limits  AFI Sum(cm)     %Tile       Largest Pocket(cm)  12.9            38          4.8  RUQ(cm)       RLQ(cm)       LUQ(cm)        LLQ(cm)  3.8           0             4.8            4.3 ---------------------------------------------------------------------- Biophysical Evaluation  Amniotic F.V:   Pocket => 2 cm             F. Tone:        Observed  F. Movement:    Observed                   Score:          8/8  F. Breathing:   Observed ---------------------------------------------------------------------- OB History  Blood Type:   A+  Gravidity:    2          SAB:   1  Living:       0 ---------------------------------------------------------------------- Gestational Age  Best:          31w 6d     Det. By:  Early Ultrasound         EDD:   06/01/24                                      (10/26/23) ---------------------------------------------------------------------- Anatomy  Cranium:               Previously seen        Aortic Arch:             Not well visualized  Cavum:                 Previously seen        Ductal Arch:            Previously  seen  Ventricles:            Previously seen        Diaphragm:              Appears normal  Choroid Plexus:        Previously seen        Stomach:                Appears normal, left                                                                        sided  Cerebellum:            Not well visualized    Abdomen:                Previously seen  Posterior Fossa:       Not well visualized    Abdominal Wall:         Not well visualized  Face:                  Not well visualized    Cord Vessels:           Not well visualized  Lips:                  Not well visualized    Kidneys:                Appear normal  Thoracic:              Previously seen        Bladder:                Appears normal  Heart:                 Not well visualized    Spine:                  Previously seen  RVOT:                  Not well visualized    Upper Extremities:      Previously seen  LVOT:                  Not well visualized    Lower Extremities:      Previously seen ---------------------------------------------------------------------- Doppler - Fetal Vessels  Umbilical Artery                                                              ADFV    RDFV                                                                Yes      No ---------------------------------------------------------------------- Cervix Uterus Adnexa  Cervix  Not visualized (advanced GA >24wks)  Uterus  No abnormality visualized.  Right Ovary  Not visualized.  Left Ovary  Not visualized.  Cul De Sac  No free fluid seen.  Adnexa  No abnormality visualized ---------------------------------------------------------------------- Comments  This patient has been hospitalized due to chronic  hypertension with superimposed preeclampsia along with  IUGR with intermittent absent end-diastolic flow.  A biophysical profile performed today was 8/8.  The AFI was 12.9 cm.  Vertex  presentation.  Doppler studies of the umbilical arteries performed today  showed persistent absent end-diastolic flow.  There were no  signs of reversed end-diastolic flow.  The persistent end-diastolic flow is a new finding as her prior  exam showed intermittent absent end-diastolic flow.  She should continue inpatient management with daily fetal  testing.  Delivery is recommended by 34 weeks.  Delivery is recommended prior to 34 weeks showed reversed  end-diastolic flow be noted, should her preeclampsia worsen,  or should she have any abnormalities in her [facility] labs. ----------------------------------------------------------------------                   Steffan Keys, MD Electronically Signed Final Report   04/05/2024 05:08 pm ----------------------------------------------------------------------   US  MFM FETAL BPP WO NON STRESS Result Date: 04/04/2024 ----------------------------------------------------------------------  OBSTETRICS REPORT                       (Signed Final 04/04/2024 08:01 pm) ---------------------------------------------------------------------- Patient Info  ID #:       [id number]                          D.O.B.:  1990/12/23 (32 yrs)(F)  Name:       Summer Campbell                 Visit Date: 04/04/2024 03:20 pm ---------------------------------------------------------------------- Performed By  Attending:        Steffan Keys MD         Ref. Address:      [address]                                                              [place], [place]                                                              [address]  Performed By:     Metta Chang RDMS         Secondary Phy.:    [facility]                                                              Care  Referred By:      BEBE                [facility] ---------------------------------------------------------------------- Orders  #  Description                            Code        Ordered By  1  US  MFM FETAL BPP WO NON               76819.01    [facility] FORSY[facility]     STRESS ----------------------------------------------------------------------  #  Order #                     Accession #                Episode #  1  [id number]                   [id number]                 [id number] ---------------------------------------------------------------------- Indications  Non-reactive NST                                O28.9  Maternal care for known or suspected poor       O36.5930  fetal growth, third trimester, single or  unspecified fetus IUGR  Severe preeclampsia, third trimester            O14.13  Pre-existing diabetes, type 2, in pregnancy,    O24.113  third trimester (insulin )  [redacted] weeks gestation of pregnancy                 Z3A.31  Pre-existing essential hypertension             O10.013  complicating pregnancy, third trimester  (procardia  and labetalol )  Obesity complicating pregnancy, third           O99.213  trimester (BMI 51.7)  Anxiety during pregnancy, third trimester       O99.343, F41.9 ---------------------------------------------------------------------- Fetal Evaluation  Num Of Fetuses:          1  Fetal Heart Rate(bpm):   137  Cardiac Activity:        Observed  Presentation:            Cephalic  Placenta:                Posterior  Amniotic Fluid  AFI FV:      Within normal limits  AFI Sum(cm)     %Tile       Largest Pocket(cm)  10.6            20          4.7  RUQ(cm)       RLQ(cm)       LUQ(cm)        LLQ(cm)  4.1           0             1.8            4.7 ---------------------------------------------------------------------- Biophysical Evaluation  Amniotic F.V:   Pocket => 2 cm  F. Tone:         Observed  F. Movement:    Observed                   Score:           8/8  F. Breathing:   Observed ---------------------------------------------------------------------- OB History  Blood Type:   A+  Gravidity:    2          SAB:   1  Living:       0  ---------------------------------------------------------------------- Gestational Age  Best:          31w 5d     Det. By:  Early Ultrasound         EDD:   06/01/24                                      (10/26/23) ---------------------------------------------------------------------- Comments  This patient has been hospitalized due to chronic  hypertension with superimposed preeclampsia along with  IUGR with intermittent absent end-diastolic flow.  A biophysical profile performed today was 8/8.  The AFI was 10.6 cm.  Vertex presentation. ----------------------------------------------------------------------                   Steffan Keys, MD Electronically Signed Final Report   04/04/2024 08:01 pm ----------------------------------------------------------------------   US  MFM UA CORD DOPPLER Result Date: 04/02/2024 ----------------------------------------------------------------------  OBSTETRICS REPORT                       (Signed Final 04/02/2024 09:54 am) ---------------------------------------------------------------------- Patient Info  ID #:       [id number]                          D.O.B.:  Sep 26, 1990 (32 yrs)(F)  Name:       Summer Campbell                 Visit Date: 04/01/2024 03:40 pm ---------------------------------------------------------------------- Performed By  Attending:        Steffan Keys MD         Ref. Address:     [address]                                                             [place], [place]                                                             [address]  Performed By:     Burnard CROME Custard          Secondary Phy.:   [facility]                    RDMS  Care  Referred By:      BEBE                [facility] ---------------------------------------------------------------------- Orders  #  Description                            Code        Ordered By  1  US  MFM UA CORD DOPPLER                76820.02    UGONNA ANYANWU  2  US  MFM FETAL BPP WO NON               76819.01    UGONNA ANYANWU     STRESS ----------------------------------------------------------------------  #  Order #                     Accession #                Episode #  1  [id number]                   [id number]                 [id number]  2  [id number]                   [id number]                 [id number] ---------------------------------------------------------------------- Indications  Severe preeclampsia, third trimester           O14.13  Pre-existing diabetes, type 2, in pregnancy,   O24.113  third trimester (insulin )  Maternal care for known or suspected poor      O36.5930  fetal growth, third trimester, single or  unspecified fetus IUGR  Pre-existing essential hypertension            O10.013  complicating pregnancy, third trimester  (procardia  and labetalol )  Obesity complicating pregnancy, third          O99.213  trimester (BMI 51.7)  [redacted] weeks gestation of pregnancy                Z3A.31  Anxiety during pregnancy, third trimester      O99.343, F41.9 ---------------------------------------------------------------------- Fetal Evaluation  Num Of Fetuses:         1  Fetal Heart Rate(bpm):  146  Cardiac Activity:       Observed  Presentation:           Cephalic  Placenta:               Posterior  Amniotic Fluid  AFI FV:      Within normal limits  AFI Sum(cm)     %Tile       Largest Pocket(cm)  10.4            18          3.8  RUQ(cm)       RLQ(cm)       LUQ(cm)        LLQ(cm)  3.8  1.7           1.5            3.4 ---------------------------------------------------------------------- Biophysical Evaluation  Amniotic F.V:   Within normal limits       F. Tone:        Observed  F. Movement:    Observed                   Score:          8/8  F. Breathing:   Observed ---------------------------------------------------------------------- OB History  Blood Type:   A+   Gravidity:    2          SAB:   1  Living:       0 ---------------------------------------------------------------------- Gestational Age  Best:          31w 2d     Det. By:  Early Ultrasound         EDD:   06/01/24                                      (10/26/23) ---------------------------------------------------------------------- Anatomy  Stomach:               Appears normal, left   Bladder:                Appears normal                         sided ---------------------------------------------------------------------- Doppler - Fetal Vessels  Umbilical Artery   S/D     %tile      RI    %tile      PI    %tile     PSV    ADFV    RDFV                                                     (cm/s)    4.9   > 97.5     0.8   > 97.5     1.5   > 97.5     38.5     Yes      No ---------------------------------------------------------------------- Comments  This patient has been hospitalized due to preeclampsia with  severe features.  IUGR was noted on her prior ultrasound  exam.  A BPP performed today was 8/8.  The total AFI was 11.4 cm.  Vertex presentation.  Doppler studies of the umbilical arteries continues to show an  elevated S/D ratio of 4.9.  An occasional episode of absent  end-diastolic flow was noted.  There were no signs of  reversed end-diastolic flow noted today.  She should continue the plan for inpatient management,  twice weekly umbilical artery Doppler studies and BPP's, and  twice daily NSTs.  Delivery is planned for 34 weeks. ----------------------------------------------------------------------                   Steffan Keys, MD Electronically Signed Final Report   04/02/2024 09:54 am ----------------------------------------------------------------------   US  MFM FETAL BPP WO NON STRESS Result Date: 04/02/2024 ----------------------------------------------------------------------  OBSTETRICS REPORT                       (Signed Final  04/02/2024 09:54 am)  ---------------------------------------------------------------------- Patient Info  ID #:       [id number]                          D.O.B.:  1991/06/02 (32 yrs)(F)  Name:       Summer CROME Millikan                 Visit Date: 04/01/2024 03:40 pm ---------------------------------------------------------------------- Performed By  Attending:        Steffan Keys MD         Ref. Address:     [address]                                                             [place], [place]                                                             [address]  Performed By:     Burnard CROME Custard          Secondary Phy.:   [facility]                    RDMS                                                             Care  Referred By:      BEBE                [facility] ---------------------------------------------------------------------- Orders  #  Description                           Code        Ordered By  1  US  MFM UA CORD DOPPLER                76820.02    UGONNA ANYANWU  2  US  MFM FETAL BPP WO NON               23180.98    UGONNA ANYANWU     STRESS ----------------------------------------------------------------------  #  Order #                     Accession #                Episode #  1  [id number]  [id number]                 [id number]  2  [id number]                   [id number]                 [id number] ---------------------------------------------------------------------- Indications  Severe preeclampsia, third trimester           O14.13  Pre-existing diabetes, type 2, in pregnancy,   O24.113  third trimester (insulin )  Maternal care for known or suspected poor      O36.5930  fetal growth, third trimester, single or  unspecified fetus IUGR  Pre-existing essential hypertension            O10.013  complicating pregnancy, third trimester  (procardia  and labetalol )  Obesity complicating pregnancy, third          O99.213   trimester (BMI 51.7)  [redacted] weeks gestation of pregnancy                Z3A.31  Anxiety during pregnancy, third trimester      O99.343, F41.9 ---------------------------------------------------------------------- Fetal Evaluation  Num Of Fetuses:         1  Fetal Heart Rate(bpm):  146  Cardiac Activity:       Observed  Presentation:           Cephalic  Placenta:               Posterior  Amniotic Fluid  AFI FV:      Within normal limits  AFI Sum(cm)     %Tile       Largest Pocket(cm)  10.4            18          3.8  RUQ(cm)       RLQ(cm)       LUQ(cm)        LLQ(cm)  3.8           1.7           1.5            3.4 ---------------------------------------------------------------------- Biophysical Evaluation  Amniotic F.V:   Within normal limits       F. Tone:        Observed  F. Movement:    Observed                   Score:          8/8  F. Breathing:   Observed ---------------------------------------------------------------------- OB History  Blood Type:   A+  Gravidity:    2          SAB:   1  Living:       0 ---------------------------------------------------------------------- Gestational Age  Best:          31w 2d     Det. By:  Early Ultrasound         EDD:   06/01/24                                      (10/26/23) ---------------------------------------------------------------------- Anatomy  Stomach:               Appears normal, left   Bladder:                Appears normal  sided ---------------------------------------------------------------------- Doppler - Fetal Vessels  Umbilical Artery   S/D     %tile      RI    %tile      PI    %tile     PSV    ADFV    RDFV                                                     (cm/s)    4.9   > 97.5     0.8   > 97.5     1.5   > 97.5     38.5     Yes      No ---------------------------------------------------------------------- Comments  This patient has been hospitalized due to preeclampsia with  severe features.  IUGR was noted on her prior  ultrasound  exam.  A BPP performed today was 8/8.  The total AFI was 11.4 cm.  Vertex presentation.  Doppler studies of the umbilical arteries continues to show an  elevated S/D ratio of 4.9.  An occasional episode of absent  end-diastolic flow was noted.  There were no signs of  reversed end-diastolic flow noted today.  She should continue the plan for inpatient management,  twice weekly umbilical artery Doppler studies and BPP's, and  twice daily NSTs.  Delivery is planned for 34 weeks. ----------------------------------------------------------------------                   Steffan Keys, MD Electronically Signed Final Report   04/02/2024 09:54 am ----------------------------------------------------------------------   US  MFM UA CORD DOPPLER Result Date: 03/29/2024 ----------------------------------------------------------------------  OBSTETRICS REPORT                       (Signed Final 03/29/2024 05:29 pm) ---------------------------------------------------------------------- Patient Info  ID #:       [id number]                          D.O.B.:  [birth year] (32 yrs)(F)  Name:       Summer Campbell                 Visit Date: 03/29/2024 08:10 am ---------------------------------------------------------------------- Performed By  Attending:        Steffan Keys MD         Ref. Address:     [address]                                                             [place], [place]                                                             [address]  Performed By:     Powell Breen BS       Secondary Phy.:   [facility]  RDMS                                                             Care  Referred By:      BEBE                [facility] MD                               [facility] ---------------------------------------------------------------------- Orders  #  Description                           Code        Ordered By  1  US  MFM UA CORD DOPPLER                 76820.02    LAWRENCE BASS  2  US  MFM FETAL BPP WO NON               76819.01    JERILYNN BUDDLE     STRESS ----------------------------------------------------------------------  #  Order #                     Accession #                Episode #  1  [id number]                   [id number]                 [id number]  2  [id number]                   [id number]                 [id number] ---------------------------------------------------------------------- Indications  Maternal care for known or suspected poor      O36.5930  fetal growth, third trimester, single or  unspecified fetus IUGR  Severe preeclampsia, third trimester           O14.13  Pre-existing diabetes, type 2, in pregnancy,   O24.113  third trimester (insulin )  Anxiety during pregnancy, third trimester      O99.343, F41.9  Obesity complicating pregnancy, third          O99.213  trimester (BMI 51.7)  Pre-existing essential hypertension            O10.013  complicating pregnancy, third trimester  (procardia  and labetalol )  LR NIPS, neg Horizon, neg AFP  [redacted] weeks gestation of pregnancy                Z3A.30 ---------------------------------------------------------------------- Fetal Evaluation  Num Of Fetuses:         1  Fetal Heart Rate(bpm):  129  Cardiac Activity:       Observed  Presentation:           Cephalic  Placenta:               Posterior  P. Cord Insertion:      Previously seen  Amniotic Fluid  AFI FV:      Within normal limits  AFI Sum(cm)     %Tile       Largest Pocket(cm)  19.5            75          5.2  RUQ(cm)       RLQ(cm)       LUQ(cm)        LLQ(cm)  5.2           4.6           4.7            5 ---------------------------------------------------------------------- Biophysical Evaluation  Amniotic F.V:   Pocket => 2 cm             F. Tone:        Observed  F. Movement:    Observed                   Score:          8/8  F. Breathing:   Observed ---------------------------------------------------------------------- OB History   Blood Type:   A+  Gravidity:    2          SAB:   1  Living:       0 ---------------------------------------------------------------------- Gestational Age  Best:          30w 6d     Det. By:  Early Ultrasound         EDD:   06/01/24                                      (10/26/23) ---------------------------------------------------------------------- Anatomy  Cranium:               Appears normal         Stomach:                Appears normal, left                                                                        sided  Thoracic:              Appears normal         Kidneys:                Appear normal  Diaphragm:             Appears normal         Bladder:                Appears normal ---------------------------------------------------------------------- Doppler - Fetal Vessels  Umbilical Artery   S/D     %tile      RI    %tile      PI    %tile     PSV    ADFV    RDFV                                                     (  cm/s)    4.5   > 97.5     0.8   > 97.5     1.5   > 97.5     35.7     Yes      No ---------------------------------------------------------------------- Cervix Uterus Adnexa  Cervix  Not visualized (advanced GA >24wks)  Uterus  No abnormality visualized.  Right Ovary  Not visualized.  Left Ovary  Not visualized.  Cul De Sac  No free fluid seen.  Adnexa  No abnormality visualized ---------------------------------------------------------------------- Comments  This patient has been hospitalized due to preeclampsia with  severe features.  IUGR was noted on prior ultrasound exam.  A BPP performed today was 8/8.  The total AFI was 19.5 cm.  Vertex presentation.  Doppler studies of the umbilical arteries continues to show an  elevated S/D ratio of 4.5.  An occasional episode of absent  end-diastolic flow was noted.  There were no signs of  reversed end-diastolic flow noted today.  She should continue the plan for inpatient management,  twice weekly umbilical artery Doppler studies and BPP's, and   twice daily NSTs.  Delivery is planned for 34 weeks. ----------------------------------------------------------------------                   Steffan Keys, MD Electronically Signed Final Report   03/29/2024 05:29 pm ----------------------------------------------------------------------   US  MFM FETAL BPP WO NON STRESS Result Date: 03/29/2024 ----------------------------------------------------------------------  OBSTETRICS REPORT                       (Signed Final 03/29/2024 05:29 pm) ---------------------------------------------------------------------- Patient Info  ID #:       [id number]                          D.O.B.:  May 19, 1991 (32 yrs)(F)  Name:       Summer Campbell                 Visit Date: 03/29/2024 08:10 am ---------------------------------------------------------------------- Performed By  Attending:        Steffan Keys MD         Ref. Address:     [address]                                                             [place], [place]                                                             [address]  Performed By:     Powell Breen BS       Secondary Phy.:   [facility]                    RDMS  Care  Referred By:      BEBE                [facility] ---------------------------------------------------------------------- Orders  #  Description                           Code        Ordered By  1  US  MFM UA CORD DOPPLER                76820.02    LAWRENCE BASS  2  US  MFM FETAL BPP WO NON               76819.01    JERILYNN BUDDLE     STRESS ----------------------------------------------------------------------  #  Order #                     Accession #                Episode #  1  [id number]                   [id number]                 [id number]  2  [id number]                   [id number]                 [id number]  ---------------------------------------------------------------------- Indications  Maternal care for known or suspected poor      O36.5930  fetal growth, third trimester, single or  unspecified fetus IUGR  Severe preeclampsia, third trimester           O14.13  Pre-existing diabetes, type 2, in pregnancy,   O24.113  third trimester (insulin )  Anxiety during pregnancy, third trimester      O99.343, F41.9  Obesity complicating pregnancy, third          O99.213  trimester (BMI 51.7)  Pre-existing essential hypertension            O10.013  complicating pregnancy, third trimester  (procardia  and labetalol )  LR NIPS, neg Horizon, neg AFP  [redacted] weeks gestation of pregnancy                Z3A.30 ---------------------------------------------------------------------- Fetal Evaluation  Num Of Fetuses:         1  Fetal Heart Rate(bpm):  129  Cardiac Activity:       Observed  Presentation:           Cephalic  Placenta:               Posterior  P. Cord Insertion:      Previously seen  Amniotic Fluid  AFI FV:      Within normal limits  AFI Sum(cm)     %Tile       Largest Pocket(cm)  19.5            75          5.2  RUQ(cm)       RLQ(cm)  LUQ(cm)        LLQ(cm)  5.2           4.6           4.7            5 ---------------------------------------------------------------------- Biophysical Evaluation  Amniotic F.V:   Pocket => 2 cm             F. Tone:        Observed  F. Movement:    Observed                   Score:          8/8  F. Breathing:   Observed ---------------------------------------------------------------------- OB History  Blood Type:   A+  Gravidity:    2          SAB:   1  Living:       0 ---------------------------------------------------------------------- Gestational Age  Best:          30w 6d     Det. By:  Early Ultrasound         EDD:   06/01/24                                      (10/26/23) ---------------------------------------------------------------------- Anatomy  Cranium:               Appears normal          Stomach:                Appears normal, left                                                                        sided  Thoracic:              Appears normal         Kidneys:                Appear normal  Diaphragm:             Appears normal         Bladder:                Appears normal ---------------------------------------------------------------------- Doppler - Fetal Vessels  Umbilical Artery   S/D     %tile      RI    %tile      PI    %tile     PSV    ADFV    RDFV                                                     (cm/s)    4.5   > 97.5     0.8   > 97.5     1.5   > 97.5     35.7     Yes      No ---------------------------------------------------------------------- Cervix Uterus Adnexa  Cervix  Not visualized (advanced GA >24wks)  Uterus  No abnormality visualized.  Right Ovary  Not visualized.  Left Ovary  Not visualized.  Cul De Sac  No free fluid seen.  Adnexa  No abnormality visualized ---------------------------------------------------------------------- Comments  This patient has been hospitalized due to preeclampsia with  severe features.  IUGR was noted on prior ultrasound exam.  A BPP performed today was 8/8.  The total AFI was 19.5 cm.  Vertex presentation.  Doppler studies of the umbilical arteries continues to show an  elevated S/D ratio of 4.5.  An occasional episode of absent  end-diastolic flow was noted.  There were no signs of  reversed end-diastolic flow noted today.  She should continue the plan for inpatient management,  twice weekly umbilical artery Doppler studies and BPP's, and  twice daily NSTs.  Delivery is planned for 34 weeks. ----------------------------------------------------------------------                   Steffan Keys, MD Electronically Signed Final Report   03/29/2024 05:29 pm ----------------------------------------------------------------------   US  MFM OB FOLLOW UP Result Date:  03/25/2024 ----------------------------------------------------------------------  OBSTETRICS REPORT                       (Signed Final 03/25/2024 05:31 pm) ---------------------------------------------------------------------- Patient Info  ID #:       [id number]                          D.O.B.:  [birth year] (32 yrs)(F)  Name:       Summer CROME Paulhus                 Visit Date: 03/25/2024 10:29 am ---------------------------------------------------------------------- Performed By  Attending:        Nathanel Fetters      Ref. Address:     [address]                    [place]                                                             [place], [place]                                                             [address]  Performed By:     Burnard CROME Custard          Secondary Phy.:   [facility]                    RDMS                                                             Care  Referred By:      CHARLIE                [facility] ---------------------------------------------------------------------- Orders  #  Description                           Code        Ordered By  1  US  MFM OB FOLLOW UP                   76816.01    CHARLIE PICKENS  2  US  MFM UA CORD DOPPLER                76820.02    CHARLIE PICKENS  3  US  MFM FETAL BPP WO NON               76819.01    CHARLIE PICKENS     STRESS ----------------------------------------------------------------------  #  Order #                     Accession #                Episode #  1  [id number]                   [id number]                 [id number]  2  [id number]                   [id number]                 [id number]  3  [id number]                   [id number]                 [id number] ---------------------------------------------------------------------- Indications  Severe preeclampsia, third trimester           O14.13  [redacted] weeks gestation of pregnancy                Z3A.30   Pre-existing diabetes, type 2, in pregnancy,   O24.112  second trimester (Insulin )  Anxiety during pregnancy, second trimester     O99.342, F41.9  (Lexapro) ---------------------------------------------------------------------- Fetal Evaluation  Num Of Fetuses:         1  Fetal Heart Rate(bpm):  133  Cardiac Activity:       Observed  Presentation:           Cephalic  Placenta:               Posterior  Amniotic Fluid  AFI FV:      Within normal limits ---------------------------------------------------------------------- Biophysical Evaluation  Amniotic F.V:   Within normal limits       F. Tone:        Observed  F. Movement:    Observed                   Score:          8/8  F. Breathing:   Observed ---------------------------------------------------------------------- Biometry  BPD:      76.5  mm     G. Age:  30w 5d         51  %    CI:        78.66   %  70 - 86                                                          FL/HC:      19.6   %    19.2 - 21.4  HC:      272.8  mm     G. Age:  29w 5d          8  %    HC/AC:      1.10        0.99 - 1.21  AC:      248.2  mm     G. Age:  29w 0d         13  %    FL/BPD:     70.1   %    71 - 87  FL:       53.6  mm     G. Age:  28w 3d        3.5  %    FL/AC:      21.6   %    20 - 24  Est. FW:    1323  gm    2 lb 15 oz       8  % ---------------------------------------------------------------------- OB History  Blood Type:   A+  Gravidity:    2          SAB:   1  Living:       0 ---------------------------------------------------------------------- Gestational Age  U/S Today:     29w 3d                                        EDD:   06/07/24  Best:          30w 2d     Det. By:  Early Ultrasound         EDD:   06/01/24                                      (10/26/23) ---------------------------------------------------------------------- Anatomy  Cranium:               Previously seen        Aortic Arch:            Not well visualized  Cavum:                 Previously seen         Ductal Arch:            Previously seen  Ventricles:            Previously seen        Diaphragm:              Appears normal  Choroid Plexus:        Previously seen        Stomach:                Appears normal, left  sided  Cerebellum:            Not well visualized    Abdomen:                Previously seen  Posterior Fossa:       Not well visualized    Abdominal Wall:         Not well visualized  Face:                  Not well visualized    Cord Vessels:           Not well visualized  Lips:                  Not well visualized    Kidneys:                Appear normal  Thoracic:              Previously seen        Bladder:                Appears normal  Heart:                 Not well visualized    Spine:                  Previously seen  RVOT:                  Not well visualized    Upper Extremities:      Previously seen  LVOT:                  Not well visualized    Lower Extremities:      Previously seen ---------------------------------------------------------------------- Doppler - Fetal Vessels  Umbilical Artery                                                              ADFV    RDFV                                                                Yes      No ---------------------------------------------------------------------- Cervix Uterus Adnexa  Cervix  Not visualized (advanced GA >24wks)  Adnexa  No abnormality visualized ---------------------------------------------------------------------- Impression  Follow up growth due to preeclampsia with severe features  Normal interval growth with measurements consistent with  fetal growth restriction  Good fetal movement and amniotic fluid volume  BPP 8/8  The UAD are intermittently absent with >50% demonstratng  AEDF but no evidence of REDF.  I discussed this case with Dr. Zina. recommend twice weekly  UAD/BPP with two times daily NST.  Repeat growth in 3 weeks  Plan for delivery at 34  week. ----------------------------------------------------------------------               Nathanel Fetters, MD Electronically Signed Final Report   03/25/2024 05:31 pm ----------------------------------------------------------------------   US  MFM UA CORD DOPPLER Result Date: 03/25/2024 ----------------------------------------------------------------------  OBSTETRICS REPORT                       (  Signed Final 03/25/2024 05:31 pm) ---------------------------------------------------------------------- Patient Info  ID #:       [id number]                          D.O.B.:  [birth year] (32 yrs)(F)  Name:       Summer CROME Bert                 Visit Date: 03/25/2024 10:29 am ---------------------------------------------------------------------- Performed By  Attending:        Nathanel Fetters      Ref. Address:     [address]                    [place]                                                             [place], [place]                                                             [address]  Performed By:     Burnard CROME Custard          Secondary Phy.:   [facility]                    RDMS                                                             Care  Referred By:      BEBE                [facility] ---------------------------------------------------------------------- Orders  #  Description                           Code        Ordered By  1  US  MFM OB FOLLOW UP                   76816.01    CHARLIE PICKENS  2  US  MFM UA CORD DOPPLER                76820.02    CHARLIE PICKENS  3  US  MFM FETAL BPP WO NON               23180.98    CHARLIE PICKENS     STRESS ----------------------------------------------------------------------  #  Order #  Accession #                Episode #  1  [id number]                   [id number]                 [id number]  2  [id number]                   [id number]                  [id number]  3  [id number]                   [id number]                 [id number] ---------------------------------------------------------------------- Indications  Severe preeclampsia, third trimester           O14.13  [redacted] weeks gestation of pregnancy                Z3A.30  Pre-existing diabetes, type 2, in pregnancy,   O24.112  second trimester (Insulin )  Anxiety during pregnancy, second trimester     O99.342, F41.9  (Lexapro) ---------------------------------------------------------------------- Fetal Evaluation  Num Of Fetuses:         1  Fetal Heart Rate(bpm):  133  Cardiac Activity:       Observed  Presentation:           Cephalic  Placenta:               Posterior  Amniotic Fluid  AFI FV:      Within normal limits ---------------------------------------------------------------------- Biophysical Evaluation  Amniotic F.V:   Within normal limits       F. Tone:        Observed  F. Movement:    Observed                   Score:          8/8  F. Breathing:   Observed ---------------------------------------------------------------------- Biometry  BPD:      76.5  mm     G. Age:  30w 5d         51  %    CI:        78.66   %    70 - 86                                                          FL/HC:      19.6   %    19.2 - 21.4  HC:      272.8  mm     G. Age:  29w 5d          8  %    HC/AC:      1.10        0.99 - 1.21  AC:      248.2  mm     G. Age:  29w 0d         13  %    FL/BPD:     70.1   %    71 - 87  FL:       53.6  mm     G. Age:  28w 3d  3.5  %    FL/AC:      21.6   %    20 - 24  Est. FW:    1323  gm    2 lb 15 oz       8  % ---------------------------------------------------------------------- OB History  Blood Type:   A+  Gravidity:    2          SAB:   1  Living:       0 ---------------------------------------------------------------------- Gestational Age  U/S Today:     29w 3d                                        EDD:   06/07/24  Best:          30w 2d     Det. By:  Early Ultrasound         EDD:    06/01/24                                      (10/26/23) ---------------------------------------------------------------------- Anatomy  Cranium:               Previously seen        Aortic Arch:            Not well visualized  Cavum:                 Previously seen        Ductal Arch:            Previously seen  Ventricles:            Previously seen        Diaphragm:              Appears normal  Choroid Plexus:        Previously seen        Stomach:                Appears normal, left                                                                        sided  Cerebellum:            Not well visualized    Abdomen:                Previously seen  Posterior Fossa:       Not well visualized    Abdominal Wall:         Not well visualized  Face:                  Not well visualized    Cord Vessels:           Not well visualized  Lips:                  Not well visualized    Kidneys:                Appear normal  Thoracic:  Previously seen        Bladder:                Appears normal  Heart:                 Not well visualized    Spine:                  Previously seen  RVOT:                  Not well visualized    Upper Extremities:      Previously seen  LVOT:                  Not well visualized    Lower Extremities:      Previously seen ---------------------------------------------------------------------- Doppler - Fetal Vessels  Umbilical Artery                                                              ADFV    RDFV                                                                Yes      No ---------------------------------------------------------------------- Cervix Uterus Adnexa  Cervix  Not visualized (advanced GA >24wks)  Adnexa  No abnormality visualized ---------------------------------------------------------------------- Impression  Follow up growth due to preeclampsia with severe features  Normal interval growth with measurements consistent with  fetal growth restriction  Good fetal movement  and amniotic fluid volume  BPP 8/8  The UAD are intermittently absent with >50% demonstratng  AEDF but no evidence of REDF.  I discussed this case with Dr. Zina. recommend twice weekly  UAD/BPP with two times daily NST.  Repeat growth in 3 weeks  Plan for delivery at 34 week. ----------------------------------------------------------------------               Nathanel Fetters, MD Electronically Signed Final Report   03/25/2024 05:31 pm ----------------------------------------------------------------------   US  MFM FETAL BPP WO NON STRESS Result Date: 03/25/2024 ----------------------------------------------------------------------  OBSTETRICS REPORT                       (Signed Final 03/25/2024 05:31 pm) ---------------------------------------------------------------------- Patient Info  ID #:       [id number]                          D.O.B.:  04-Mar-1991 (32 yrs)(F)  Name:       Summer CROME Ericson                 Visit Date: 03/25/2024 10:29 am ---------------------------------------------------------------------- Performed By  Attending:        Nathanel Fetters      Ref. Address:     [address]                    [place]  [place], [place]                                                             [address]  Performed By:     Burnard LITTIE Custard          Secondary Phy.:   [facility]                    RDMS                                                             Care  Referred By:      BEBE                [facility] [place]                               [facility] ---------------------------------------------------------------------- Orders  #  Description                           Code        Ordered By  1  US  MFM OB FOLLOW UP                   23183.98    CHARLIE PICKENS  2  US  MFM UA CORD DOPPLER                76820.02    CHARLIE PICKENS  3  US  MFM FETAL BPP WO NON               76819.01    CHARLIE PICKENS      STRESS ----------------------------------------------------------------------  #  Order #                     Accession #                Episode #  1  [id number]                   [id number]                 [id number]  2  [id number]                   [id number]                 [id number]  3  [id number]                   [id number]                 [id number] ---------------------------------------------------------------------- Indications  Severe preeclampsia, third trimester           O14.13  [redacted] weeks gestation of pregnancy  Z3A.30  Pre-existing diabetes, type 2, in pregnancy,   O24.112  second trimester (Insulin )  Anxiety during pregnancy, second trimester     O99.342, F41.9  (Lexapro) ---------------------------------------------------------------------- Fetal Evaluation  Num Of Fetuses:         1  Fetal Heart Rate(bpm):  133  Cardiac Activity:       Observed  Presentation:           Cephalic  Placenta:               Posterior  Amniotic Fluid  AFI FV:      Within normal limits ---------------------------------------------------------------------- Biophysical Evaluation  Amniotic F.V:   Within normal limits       F. Tone:        Observed  F. Movement:    Observed                   Score:          8/8  F. Breathing:   Observed ---------------------------------------------------------------------- Biometry  BPD:      76.5  mm     G. Age:  30w 5d         51  %    CI:        78.66   %    70 - 86                                                          FL/HC:      19.6   %    19.2 - 21.4  HC:      272.8  mm     G. Age:  29w 5d          8  %    HC/AC:      1.10        0.99 - 1.21  AC:      248.2  mm     G. Age:  29w 0d         13  %    FL/BPD:     70.1   %    71 - 87  FL:       53.6  mm     G. Age:  28w 3d        3.5  %    FL/AC:      21.6   %    20 - 24  Est. FW:    1323  gm    2 lb 15 oz       8  % ---------------------------------------------------------------------- OB History  Blood Type:   A+  Gravidity:     2          SAB:   1  Living:       0 ---------------------------------------------------------------------- Gestational Age  U/S Today:     29w 3d                                        EDD:   06/07/24  Best:          30w 2d     Det. By:  Early Ultrasound         EDD:   06/01/24                                      (  10/26/23) ---------------------------------------------------------------------- Anatomy  Cranium:               Previously seen        Aortic Arch:            Not well visualized  Cavum:                 Previously seen        Ductal Arch:            Previously seen  Ventricles:            Previously seen        Diaphragm:              Appears normal  Choroid Plexus:        Previously seen        Stomach:                Appears normal, left                                                                        sided  Cerebellum:            Not well visualized    Abdomen:                Previously seen  Posterior Fossa:       Not well visualized    Abdominal Wall:         Not well visualized  Face:                  Not well visualized    Cord Vessels:           Not well visualized  Lips:                  Not well visualized    Kidneys:                Appear normal  Thoracic:              Previously seen        Bladder:                Appears normal  Heart:                 Not well visualized    Spine:                  Previously seen  RVOT:                  Not well visualized    Upper Extremities:      Previously seen  LVOT:                  Not well visualized    Lower Extremities:      Previously seen ---------------------------------------------------------------------- Doppler - Fetal Vessels  Umbilical Artery  ADFV    RDFV                                                                Yes      No ---------------------------------------------------------------------- Cervix Uterus Adnexa  Cervix  Not visualized (advanced GA >24wks)  Adnexa   No abnormality visualized ---------------------------------------------------------------------- Impression  Follow up growth due to preeclampsia with severe features  Normal interval growth with measurements consistent with  fetal growth restriction  Good fetal movement and amniotic fluid volume  BPP 8/8  The UAD are intermittently absent with >50% demonstratng  AEDF but no evidence of REDF.  I discussed this case with Dr. Zina. recommend twice weekly  UAD/BPP with two times daily NST.  Repeat growth in 3 weeks  Plan for delivery at 34 week. ----------------------------------------------------------------------               Nathanel Fetters, MD Electronically Signed Final Report   03/25/2024 05:31 pm ----------------------------------------------------------------------     Medications:  Scheduled  aspirin   162 mg Oral Daily   escitalopram  20 mg Oral Daily   insulin  aspart  0-15 Units Subcutaneous TID PC   insulin  aspart  5 Units Subcutaneous TID WC   insulin  glargine-yfgn  10 Units Subcutaneous Daily   labetalol   800 mg Oral TID   metFORMIN   1,000 mg Oral BID WC   NIFEdipine   60 mg Oral BID   pantoprazole  20 mg Oral Daily   prenatal multivitamin  1 tablet Oral Q1200   sodium chloride  flush  3 mL Intravenous Q12H   valACYclovir  500 mg Oral BID   I have reviewed the patient's current medications.   Vina Solian, MD 04/13/2024,11:17 AM

## 2024-04-13 NOTE — Plan of Care (Signed)
  Problem: Education: Goal: Knowledge of disease or condition will improve Outcome: Progressing Goal: Knowledge of the prescribed therapeutic regimen will improve Outcome: Progressing   Problem: Fluid Volume: Goal: Peripheral tissue perfusion will improve Outcome: Progressing   Problem: Clinical Measurements: Goal: Complications related to disease process, condition or treatment will be avoided or minimized Outcome: Progressing   Problem: Health Behavior/Discharge Planning: Goal: Ability to manage health-related needs will improve Outcome: Progressing   Problem: Clinical Measurements: Goal: Ability to maintain clinical measurements within normal limits will improve Outcome: Progressing Goal: Will remain free from infection Outcome: Progressing Goal: Diagnostic test results will improve Outcome: Progressing Goal: Respiratory complications will improve Outcome: Progressing Goal: Cardiovascular complication will be avoided Outcome: Progressing   Problem: Activity: Goal: Risk for activity intolerance will decrease Outcome: Progressing   Problem: Pain Managment: Goal: General experience of comfort will improve and/or be controlled Outcome: Progressing   Problem: Safety: Goal: Ability to remain free from injury will improve Outcome: Progressing   Problem: Skin Integrity: Goal: Risk for impaired skin integrity will decrease Outcome: Progressing   Problem: Education: Goal: Knowledge of disease or condition will improve Outcome: Progressing Goal: Knowledge of the prescribed therapeutic regimen will improve Outcome: Progressing Goal: Individualized Educational Video(s) Outcome: Progressing   Problem: Clinical Measurements: Goal: Complications related to the disease process, condition or treatment will be avoided or minimized Outcome: Progressing   Problem: Education: Goal: Ability to describe self-care measures that may prevent or decrease complications (Diabetes  Survival Skills Education) will improve Outcome: Progressing Goal: Individualized Educational Video(s) Outcome: Progressing   Problem: Coping: Goal: Ability to adjust to condition or change in health will improve Outcome: Progressing   Problem: Fluid Volume: Goal: Ability to maintain a balanced intake and output will improve Outcome: Progressing   Problem: Health Behavior/Discharge Planning: Goal: Ability to identify and utilize available resources and services will improve Outcome: Progressing Goal: Ability to manage health-related needs will improve Outcome: Progressing   Problem: Metabolic: Goal: Ability to maintain appropriate glucose levels will improve Outcome: Progressing   Problem: Nutritional: Goal: Maintenance of adequate nutrition will improve Outcome: Progressing Goal: Progress toward achieving an optimal weight will improve Outcome: Progressing   Problem: Skin Integrity: Goal: Risk for impaired skin integrity will decrease Outcome: Progressing   Problem: Tissue Perfusion: Goal: Adequacy of tissue perfusion will improve Outcome: Progressing

## 2024-04-13 NOTE — Progress Notes (Signed)
 Labor Progress Note Summer Campbell is a 33 y.o. G2P0010 at [redacted]w[redacted]d presented for PreE with severe features superimposed on cHTN, T2DM, FGR with end diastolic flow.   S:  Patient doing well in the room, sleeping comfortably in bed. Not having severe pain.   O:  BP (!) 159/82   Pulse 85   Temp 98.2 F (36.8 C) (Oral)   Resp 18   Ht 5' 4 (1.626 m)   Wt (!) 152 kg   LMP  (LMP Unknown)   SpO2 99%   BMI 57.52 kg/m  EFM: 125/moderate variability/+accels, - decels  CVE: Dilation: Closed Effacement (%): Thick Cervical Position: Posterior Station: Ballotable Presentation: Vertex (by US ) Exam by:: Dr. Trudy   A&P: 33 y.o. G2P0010 [redacted]w[redacted]d with PreE SF superimposed on cHTN.   #Labor: Patient closed on prior exams, will see on next exam if she is able to have a foley balloon placed.  #Pain: None for now.  #FWB: CAT I #GBS unknown, PCN prophylaxis #T2DM: Q4 hours checks #PreE: procardia  60mg  #HSV: Valtrex prophylaxis  Zareth Rippetoe E Rakwon Letourneau, MD 9:18 PM

## 2024-04-14 ENCOUNTER — Encounter (HOSPITAL_COMMUNITY): Payer: Self-pay | Admitting: Obstetrics and Gynecology

## 2024-04-14 ENCOUNTER — Inpatient Hospital Stay (HOSPITAL_COMMUNITY): Admitting: Anesthesiology

## 2024-04-14 ENCOUNTER — Encounter (HOSPITAL_COMMUNITY)
Admission: AD | Disposition: A | Discharge status: Home / Self Care | Payer: Self-pay | Source: Home / Self Care | Attending: Obstetrics and Gynecology

## 2024-04-14 DIAGNOSIS — Z3A33 33 weeks gestation of pregnancy: Secondary | ICD-10-CM

## 2024-04-14 LAB — COMPREHENSIVE METABOLIC PANEL WITH GFR
ALT: 32 U/L (ref 0–44)
ALT: 35 U/L (ref 0–44)
AST: 22 U/L (ref 15–41)
AST: 28 U/L (ref 15–41)
Albumin: 2.4 g/dL — ABNORMAL LOW (ref 3.5–5.0)
Albumin: 2.3 g/dL — ABNORMAL LOW (ref 3.5–5.0)
Alkaline Phosphatase: 74 U/L (ref 38–126)
Alkaline Phosphatase: 80 U/L (ref 38–126)
Anion gap: 13 (ref 5–15)
Anion gap: 12 (ref 5–15)
BUN: 15 mg/dL (ref 6–20)
BUN: 13 mg/dL (ref 6–20)
CO2: 20 mmol/L — ABNORMAL LOW (ref 22–32)
CO2: 18 mmol/L — ABNORMAL LOW (ref 22–32)
Calcium: 7.9 mg/dL — ABNORMAL LOW (ref 8.9–10.3)
Calcium: 7.3 mg/dL — ABNORMAL LOW (ref 8.9–10.3)
Chloride: 102 mmol/L (ref 98–111)
Chloride: 102 mmol/L (ref 98–111)
Creatinine, Ser: 0.72 mg/dL (ref 0.44–1.00)
Creatinine, Ser: 0.73 mg/dL (ref 0.44–1.00)
GFR, Estimated: >60 mL/min (ref >60)
GFR, Estimated: >60 mL/min (ref >60)
Glucose, Bld: 147 mg/dL — ABNORMAL HIGH (ref 70–99)
Glucose, Bld: 156 mg/dL — ABNORMAL HIGH (ref 70–99)
Potassium: 4.9 mmol/L (ref 3.5–5.1)
Potassium: 4.6 mmol/L (ref 3.5–5.1)
Sodium: 135 mmol/L (ref 135–145)
Sodium: 132 mmol/L — ABNORMAL LOW (ref 135–145)
Total Bilirubin: 0.6 mg/dL (ref 0.0–1.2)
Total Bilirubin: 0.6 mg/dL (ref 0.0–1.2)
Total Protein: 6.2 g/dL — ABNORMAL LOW (ref 6.5–8.1)
Total Protein: 6.1 g/dL — ABNORMAL LOW (ref 6.5–8.1)

## 2024-04-14 LAB — CBC
HCT: 38.2 % (ref 36.0–46.0)
HCT: 35.5 % — ABNORMAL LOW (ref 36.0–46.0)
Hemoglobin: 12.4 g/dL (ref 12.0–15.0)
Hemoglobin: 11.7 g/dL — ABNORMAL LOW (ref 12.0–15.0)
MCH: 28.6 pg (ref 26.0–34.0)
MCH: 29 pg (ref 26.0–34.0)
MCHC: 32.5 g/dL (ref 30.0–36.0)
MCHC: 33 g/dL (ref 30.0–36.0)
MCV: 88.2 fL (ref 80.0–100.0)
MCV: 87.9 fL (ref 80.0–100.0)
Platelets: 260 K/uL (ref 150–400)
Platelets: 260 K/uL (ref 150–400)
RBC: 4.33 MIL/uL (ref 3.87–5.11)
RBC: 4.04 MIL/uL (ref 3.87–5.11)
RDW: 13.4 % (ref 11.5–15.5)
RDW: 13.4 % (ref 11.5–15.5)
WBC: 17 K/uL — ABNORMAL HIGH (ref 4.0–10.5)
WBC: 23.4 K/uL — ABNORMAL HIGH (ref 4.0–10.5)
nRBC: 0 % (ref 0.0–0.2)
nRBC: 0 % (ref 0.0–0.2)

## 2024-04-14 LAB — GLUCOSE, CAPILLARY
Glucose-Capillary: 109 mg/dL — ABNORMAL HIGH (ref 70–99)
Glucose-Capillary: 116 mg/dL — ABNORMAL HIGH (ref 70–99)
Glucose-Capillary: 134 mg/dL — ABNORMAL HIGH (ref 70–99)
Glucose-Capillary: 125 mg/dL — ABNORMAL HIGH (ref 70–99)
Glucose-Capillary: 157 mg/dL — ABNORMAL HIGH (ref 70–99)
Glucose-Capillary: 181 mg/dL — ABNORMAL HIGH (ref 70–99)

## 2024-04-14 LAB — RPR: RPR Ser Ql: NONREACTIVE

## 2024-04-14 SURGERY — Surgical Case
Anesthesia: Epidural

## 2024-04-14 MED ORDER — LACTATED RINGERS IV SOLN: 500.0000 mL | Freq: Once | INTRAVENOUS | Status: DC

## 2024-04-14 MED ORDER — LIDOCAINE HCL (PF) 1 % IJ SOLN
INTRAMUSCULAR | Status: DC | PRN
  Administered 2024-04-14: 8 mL via EPIDURAL

## 2024-04-14 MED ORDER — EPHEDRINE 5 MG/ML INJ
10.0000 mg | INTRAVENOUS | Status: AC | PRN
  Administered 2024-04-14 (×2): 10 mg via INTRAVENOUS
  Filled 2024-04-14: qty 5

## 2024-04-14 MED ORDER — MORPHINE SULFATE (PF) 0.5 MG/ML IJ SOLN
INTRAMUSCULAR | Status: DC | PRN
  Administered 2024-04-14: 3 mg via EPIDURAL

## 2024-04-14 MED ORDER — NIFEDIPINE ER OSMOTIC RELEASE 60 MG PO TB24
60.0000 mg | ORAL_TABLET | Freq: Two times a day (BID) | ORAL | Status: DC
  Administered 2024-04-14: 60 mg via ORAL
  Filled 2024-04-14: qty 1

## 2024-04-14 MED ORDER — PHENYLEPHRINE 80 MCG/ML (10ML) SYRINGE FOR IV PUSH (FOR BLOOD PRESSURE SUPPORT)
PREFILLED_SYRINGE | INTRAVENOUS | Status: AC
Start: 2024-04-14 — End: 2024-04-14
  Filled 2024-04-14: qty 10

## 2024-04-14 MED ORDER — ACETAMINOPHEN 10 MG/ML IV SOLN
INTRAVENOUS | Status: DC | PRN
  Administered 2024-04-14: 1000 mg via INTRAVENOUS

## 2024-04-14 MED ORDER — ONDANSETRON HCL 4 MG/2ML IJ SOLN
INTRAMUSCULAR | Status: AC
  Filled 2024-04-14: qty 2

## 2024-04-14 MED ORDER — EPHEDRINE 5 MG/ML INJ: 10.0000 mg | INTRAVENOUS | Status: DC | PRN

## 2024-04-14 MED ORDER — OXYTOCIN-SODIUM CHLORIDE 30-0.9 UT/500ML-% IV SOLN
INTRAVENOUS | Status: AC
Start: 2024-04-14 — End: 2024-04-14
  Filled 2024-04-14: qty 500

## 2024-04-14 MED ORDER — SODIUM BICARBONATE 8.4 % IV SOLN
INTRAVENOUS | Status: DC | PRN
  Administered 2024-04-14: 10 mL via EPIDURAL

## 2024-04-14 MED ORDER — FENTANYL CITRATE (PF) 100 MCG/2ML IJ SOLN
INTRAMUSCULAR | Status: AC
  Administered 2024-04-14: 100 ug via INTRAVENOUS
  Filled 2024-04-14: qty 2

## 2024-04-14 MED ORDER — TRANEXAMIC ACID-NACL 1000-0.7 MG/100ML-% IV SOLN
INTRAVENOUS | Status: AC
  Filled 2024-04-14: qty 100

## 2024-04-14 MED ORDER — OXYTOCIN-SODIUM CHLORIDE 30-0.9 UT/500ML-% IV SOLN
1.0000 m[IU]/min | INTRAVENOUS | Status: DC
  Administered 2024-04-14: 2 m[IU]/min via INTRAVENOUS

## 2024-04-14 MED ORDER — STERILE WATER FOR IRRIGATION IR SOLN
Status: DC | PRN
  Administered 2024-04-14: 1000 mL

## 2024-04-14 MED ORDER — METFORMIN HCL 500 MG PO TABS
1000.0000 mg | ORAL_TABLET | Freq: Two times a day (BID) | ORAL | Status: DC
  Administered 2024-04-15 – 2024-04-18 (×7): 1000 mg via ORAL
  Filled 2024-04-14 (×7): qty 2

## 2024-04-14 MED ORDER — LACTATED RINGERS IV SOLN
500.0000 mL | Freq: Once | INTRAVENOUS | Status: AC
  Administered 2024-04-14: 500 mL via INTRAVENOUS

## 2024-04-14 MED ORDER — KETOROLAC TROMETHAMINE 30 MG/ML IJ SOLN: 30.0000 mg | Freq: Four times a day (QID) | INTRAMUSCULAR | Status: AC | PRN

## 2024-04-14 MED ORDER — METOCLOPRAMIDE HCL 5 MG/ML IJ SOLN
INTRAMUSCULAR | Status: DC | PRN
  Administered 2024-04-14: 10 mg via INTRAVENOUS

## 2024-04-14 MED ORDER — DIPHENHYDRAMINE HCL 50 MG/ML IJ SOLN: 12.5000 mg | INTRAMUSCULAR | Status: DC | PRN

## 2024-04-14 MED ORDER — KETOROLAC TROMETHAMINE 30 MG/ML IJ SOLN
INTRAMUSCULAR | Status: AC
  Filled 2024-04-14: qty 1

## 2024-04-14 MED ORDER — ONDANSETRON HCL 4 MG/2ML IJ SOLN
INTRAMUSCULAR | Status: DC | PRN
  Administered 2024-04-14: 4 mg via INTRAVENOUS

## 2024-04-14 MED ORDER — DIBUCAINE (PERIANAL) 1 % EX OINT: 1.0000 | TOPICAL_OINTMENT | CUTANEOUS | Status: DC | PRN

## 2024-04-14 MED ORDER — LABETALOL HCL 5 MG/ML IV SOLN
20.0000 mg | INTRAVENOUS | Status: DC | PRN
  Administered 2024-04-17: 20 mg via INTRAVENOUS
  Filled 2024-04-14: qty 4

## 2024-04-14 MED ORDER — ONDANSETRON HCL 4 MG/2ML IJ SOLN
4.0000 mg | Freq: Three times a day (TID) | INTRAMUSCULAR | Status: DC | PRN
Start: 2024-04-14 — End: 2024-04-18

## 2024-04-14 MED ORDER — MORPHINE SULFATE (PF) 0.5 MG/ML IJ SOLN
INTRAMUSCULAR | Status: AC
  Filled 2024-04-14: qty 10

## 2024-04-14 MED ORDER — EPHEDRINE 5 MG/ML INJ
10.0000 mg | INTRAVENOUS | Status: AC | PRN
  Administered 2024-04-14 (×3): 10 mg via INTRAVENOUS
  Filled 2024-04-14: qty 5

## 2024-04-14 MED ORDER — SCOPOLAMINE 1 MG/3DAYS TD PT72
MEDICATED_PATCH | TRANSDERMAL | Status: AC
  Filled 2024-04-14: qty 1

## 2024-04-14 MED ORDER — LACTATED RINGERS IV SOLN: INTRAVENOUS | Status: AC

## 2024-04-14 MED ORDER — POTASSIUM CHLORIDE CRYS ER 20 MEQ PO TBCR
20.0000 meq | EXTENDED_RELEASE_TABLET | Freq: Every day | ORAL | Status: DC
  Administered 2024-04-15: 20 meq via ORAL
  Filled 2024-04-14: qty 1

## 2024-04-14 MED ORDER — TRANEXAMIC ACID-NACL 1000-0.7 MG/100ML-% IV SOLN
INTRAVENOUS | Status: DC | PRN
  Administered 2024-04-14 (×2): 1000 mg via INTRAVENOUS

## 2024-04-14 MED ORDER — LACTATED RINGERS IV SOLN: INTRAVENOUS | Status: DC

## 2024-04-14 MED ORDER — MAGNESIUM SULFATE 40 GM/1000ML IV SOLN
2.0000 g/h | INTRAVENOUS | Status: AC
  Administered 2024-04-15: 2 g/h via INTRAVENOUS
  Filled 2024-04-14: qty 1000

## 2024-04-14 MED ORDER — MEPERIDINE HCL 25 MG/ML IJ SOLN: 6.2500 mg | INTRAMUSCULAR | Status: DC | PRN

## 2024-04-14 MED ORDER — PHENYLEPHRINE 80 MCG/ML (10ML) SYRINGE FOR IV PUSH (FOR BLOOD PRESSURE SUPPORT)
PREFILLED_SYRINGE | INTRAVENOUS | Status: AC
  Filled 2024-04-14: qty 10

## 2024-04-14 MED ORDER — DIPHENHYDRAMINE HCL 50 MG/ML IJ SOLN
INTRAMUSCULAR | Status: AC
Start: 2024-04-14 — End: 2024-04-14
  Filled 2024-04-14: qty 1

## 2024-04-14 MED ORDER — FENTANYL-BUPIVACAINE-NACL 0.5-0.125-0.9 MG/250ML-% EP SOLN: 12.0000 mL/h | EPIDURAL | Status: DC | PRN

## 2024-04-14 MED ORDER — LABETALOL HCL 5 MG/ML IV SOLN
80.0000 mg | INTRAVENOUS | Status: DC | PRN
Start: 2024-04-14 — End: 2024-04-18

## 2024-04-14 MED ORDER — LABETALOL HCL 5 MG/ML IV SOLN
40.0000 mg | INTRAVENOUS | Status: DC | PRN
Start: 2024-04-14 — End: 2024-04-18

## 2024-04-14 MED ORDER — INSULIN ASPART 100 UNIT/ML IJ SOLN: 0.0000 [IU] | Freq: Every day | INTRAMUSCULAR | Status: DC

## 2024-04-14 MED ORDER — SODIUM CHLORIDE 0.9% FLUSH: 3.0000 mL | INTRAVENOUS | Status: DC | PRN

## 2024-04-14 MED ORDER — ONDANSETRON HCL 4 MG/2ML IJ SOLN: 4.0000 mg | Freq: Once | INTRAMUSCULAR | Status: DC | PRN

## 2024-04-14 MED ORDER — DEXAMETHASONE SODIUM PHOSPHATE 4 MG/ML IJ SOLN
INTRAMUSCULAR | Status: DC | PRN
  Administered 2024-04-14: 10 mg via INTRAVENOUS

## 2024-04-14 MED ORDER — OXYCODONE HCL 5 MG/5ML PO SOLN: 5.0000 mg | Freq: Once | ORAL | Status: DC | PRN

## 2024-04-14 MED ORDER — FUROSEMIDE 20 MG PO TABS
20.0000 mg | ORAL_TABLET | Freq: Every day | ORAL | Status: DC
  Administered 2024-04-15: 20 mg via ORAL
  Filled 2024-04-14: qty 1

## 2024-04-14 MED ORDER — FENTANYL-BUPIVACAINE-NACL 0.5-0.125-0.9 MG/250ML-% EP SOLN
12.0000 mL/h | EPIDURAL | Status: DC | PRN
  Administered 2024-04-14: 12 mL/h via EPIDURAL
  Filled 2024-04-14: qty 250

## 2024-04-14 MED ORDER — SCOPOLAMINE 1 MG/3DAYS TD PT72
1.0000 | MEDICATED_PATCH | Freq: Once | TRANSDERMAL | Status: AC
  Administered 2024-04-14: 1 mg via TRANSDERMAL

## 2024-04-14 MED ORDER — TERBUTALINE SULFATE 1 MG/ML IJ SOLN: 0.2500 mg | Freq: Once | INTRAMUSCULAR | Status: DC | PRN

## 2024-04-14 MED ORDER — KETOROLAC TROMETHAMINE 30 MG/ML IJ SOLN
30.0000 mg | Freq: Four times a day (QID) | INTRAMUSCULAR | Status: AC
  Administered 2024-04-15 (×4): 30 mg via INTRAVENOUS
  Filled 2024-04-14 (×4): qty 1

## 2024-04-14 MED ORDER — SODIUM CHLORIDE 0.9 % IR SOLN
Status: DC | PRN
  Administered 2024-04-14 (×2): 1

## 2024-04-14 MED ORDER — FENTANYL CITRATE (PF) 100 MCG/2ML IJ SOLN
INTRAMUSCULAR | Status: DC | PRN
  Administered 2024-04-14: 100 ug via EPIDURAL

## 2024-04-14 MED ORDER — FENTANYL CITRATE (PF) 100 MCG/2ML IJ SOLN
100.0000 ug | INTRAMUSCULAR | Status: DC | PRN
  Administered 2024-04-14: 100 ug via INTRAVENOUS
  Filled 2024-04-14: qty 2

## 2024-04-14 MED ORDER — PHENYLEPHRINE 80 MCG/ML (10ML) SYRINGE FOR IV PUSH (FOR BLOOD PRESSURE SUPPORT)
80.0000 ug | PREFILLED_SYRINGE | INTRAVENOUS | Status: DC | PRN
  Administered 2024-04-14: 160 ug via INTRAVENOUS

## 2024-04-14 MED ORDER — OXYCODONE HCL 5 MG PO TABS: 5.0000 mg | ORAL_TABLET | Freq: Once | ORAL | Status: DC | PRN

## 2024-04-14 MED ORDER — FENTANYL CITRATE (PF) 100 MCG/2ML IJ SOLN
INTRAMUSCULAR | Status: AC
  Filled 2024-04-14: qty 2

## 2024-04-14 MED ORDER — ACETAMINOPHEN 10 MG/ML IV SOLN
INTRAVENOUS | Status: AC
  Filled 2024-04-14: qty 500

## 2024-04-14 MED ORDER — DIPHENHYDRAMINE HCL 25 MG PO CAPS: 25.0000 mg | ORAL_CAPSULE | ORAL | Status: DC | PRN

## 2024-04-14 MED ORDER — PHENYLEPHRINE 80 MCG/ML (10ML) SYRINGE FOR IV PUSH (FOR BLOOD PRESSURE SUPPORT)
80.0000 ug | PREFILLED_SYRINGE | INTRAVENOUS | Status: DC | PRN
  Administered 2024-04-14: 160 ug via INTRAVENOUS
  Filled 2024-04-14: qty 10

## 2024-04-14 MED ORDER — SODIUM CHLORIDE 0.9 % IV SOLN
INTRAVENOUS | Status: AC
  Filled 2024-04-14: qty 5

## 2024-04-14 MED ORDER — FENTANYL CITRATE (PF) 100 MCG/2ML IJ SOLN: 25.0000 ug | INTRAMUSCULAR | Status: DC | PRN

## 2024-04-14 MED ORDER — DIPHENHYDRAMINE HCL 50 MG/ML IJ SOLN
12.5000 mg | INTRAMUSCULAR | Status: DC | PRN
Start: 2024-04-14 — End: 2024-04-18
  Administered 2024-04-14 – 2024-04-15 (×2): 12.5 mg via INTRAVENOUS
  Filled 2024-04-14: qty 1

## 2024-04-14 MED ORDER — IBUPROFEN 600 MG PO TABS
600.0000 mg | ORAL_TABLET | Freq: Four times a day (QID) | ORAL | Status: DC
  Administered 2024-04-16 – 2024-04-18 (×9): 600 mg via ORAL
  Filled 2024-04-14 (×9): qty 1

## 2024-04-14 MED ORDER — SODIUM BICARBONATE 8.4 % IV SOLN
INTRAVENOUS | Status: AC
Start: 2024-04-14 — End: 2024-04-14
  Filled 2024-04-14: qty 50

## 2024-04-14 MED ORDER — WITCH HAZEL-GLYCERIN EX PADS: 1.0000 | MEDICATED_PAD | CUTANEOUS | Status: DC | PRN

## 2024-04-14 MED ORDER — OXYTOCIN-SODIUM CHLORIDE 30-0.9 UT/500ML-% IV SOLN
INTRAVENOUS | Status: DC | PRN
  Administered 2024-04-14: 400 mL via INTRAVENOUS

## 2024-04-14 MED ORDER — METOCLOPRAMIDE HCL 5 MG/ML IJ SOLN
INTRAMUSCULAR | Status: AC
  Filled 2024-04-14: qty 2

## 2024-04-14 MED ORDER — CEFAZOLIN SODIUM-DEXTROSE 3-4 GM/150ML-% IV SOLN
3.0000 g | INTRAVENOUS | Status: AC
  Administered 2024-04-14: 3 g via INTRAVENOUS
  Filled 2024-04-14: qty 150

## 2024-04-14 MED ORDER — NALOXONE HCL 4 MG/10ML IJ SOLN: 1.0000 ug/kg/h | INTRAVENOUS | Status: DC | PRN

## 2024-04-14 MED ORDER — PHENYLEPHRINE 80 MCG/ML (10ML) SYRINGE FOR IV PUSH (FOR BLOOD PRESSURE SUPPORT)
80.0000 ug | PREFILLED_SYRINGE | INTRAVENOUS | Status: AC | PRN
  Administered 2024-04-14 (×2): 160 ug via INTRAVENOUS

## 2024-04-14 MED ORDER — HYDRALAZINE HCL 20 MG/ML IJ SOLN: 10.0000 mg | INTRAMUSCULAR | Status: DC | PRN

## 2024-04-14 MED ORDER — SODIUM CHLORIDE 0.9 % IV SOLN
500.0000 mg | INTRAVENOUS | Status: AC
  Administered 2024-04-14: 500 mg via INTRAVENOUS
  Filled 2024-04-14: qty 5

## 2024-04-14 MED ORDER — KETOROLAC TROMETHAMINE 30 MG/ML IJ SOLN
30.0000 mg | Freq: Four times a day (QID) | INTRAMUSCULAR | Status: AC | PRN
  Administered 2024-04-14: 30 mg via INTRAVENOUS

## 2024-04-14 MED ORDER — NALOXONE HCL 0.4 MG/ML IJ SOLN: 0.4000 mg | INTRAMUSCULAR | Status: DC | PRN

## 2024-04-14 MED ORDER — DEXAMETHASONE SODIUM PHOSPHATE 4 MG/ML IJ SOLN
INTRAMUSCULAR | Status: AC
  Filled 2024-04-14: qty 1

## 2024-04-14 MED ORDER — LIDOCAINE-EPINEPHRINE (PF) 2 %-1:200000 IJ SOLN
INTRAMUSCULAR | Status: DC | PRN
  Administered 2024-04-14: 3 mL via EPIDURAL

## 2024-04-14 MED ORDER — INSULIN ASPART 100 UNIT/ML IJ SOLN
0.0000 [IU] | Freq: Three times a day (TID) | INTRAMUSCULAR | Status: DC
  Administered 2024-04-15 – 2024-04-16 (×3): 3 [IU] via SUBCUTANEOUS
  Administered 2024-04-16: 5 [IU] via SUBCUTANEOUS
  Administered 2024-04-16: 2 [IU] via SUBCUTANEOUS
  Administered 2024-04-17: 3 [IU] via SUBCUTANEOUS
  Administered 2024-04-17: 2 [IU] via SUBCUTANEOUS
  Filled 2024-04-14: qty 3
  Filled 2024-04-14: qty 2

## 2024-04-14 SURGICAL SUPPLY — 34 items
CANISTER WOUND CARE 500ML ATS (WOUND CARE) IMPLANT
CHLORAPREP W/TINT 26 (MISCELLANEOUS) ×2 IMPLANT
CLAMP UMBILICAL CORD (MISCELLANEOUS) ×1 IMPLANT
CLOTH BEACON ORANGE TIMEOUT ST (SAFETY) ×1 IMPLANT
DERMABOND ADVANCED .7 DNX12 (GAUZE/BANDAGES/DRESSINGS) IMPLANT
DRESSING PREVENA PLUS CUSTOM (GAUZE/BANDAGES/DRESSINGS) IMPLANT
DRSG OPSITE POSTOP 4X10 (GAUZE/BANDAGES/DRESSINGS) ×1 IMPLANT
ELECTRODE REM PT RTRN 9FT ADLT (ELECTROSURGICAL) ×1 IMPLANT
EXTRACTOR VACUUM KIWI (MISCELLANEOUS) IMPLANT
GLOVE BIOGEL PI IND STRL 7.0 (GLOVE) ×2 IMPLANT
GLOVE ECLIPSE 7.0 STRL STRAW (GLOVE) ×1 IMPLANT
GOWN STRL REUS W/TWL LRG LVL3 (GOWN DISPOSABLE) ×2 IMPLANT
KIT ABG SYR 3ML LUER SLIP (SYRINGE) ×1 IMPLANT
MAT PREVALON FULL STRYKER (MISCELLANEOUS) IMPLANT
NDL HYPO 25X5/8 SAFETYGLIDE (NEEDLE) ×1 IMPLANT
NEEDLE HYPO 25X5/8 SAFETYGLIDE (NEEDLE) ×1 IMPLANT
NS IRRIG 1000ML POUR BTL (IV SOLUTION) ×1 IMPLANT
PACK C SECTION WH (CUSTOM PROCEDURE TRAY) ×1 IMPLANT
PAD OB MATERNITY 4.3X12.25 (PERSONAL CARE ITEMS) ×1 IMPLANT
RETAINER VISCERAL (MISCELLANEOUS) IMPLANT
RETRACTOR TRAXI PANNICULUS (MISCELLANEOUS) IMPLANT
RTRCTR C-SECT PINK 25CM LRG (MISCELLANEOUS) ×1 IMPLANT
SUT MNCRL 0 VIOLET CTX 36 (SUTURE) ×1 IMPLANT
SUT PDS AB 0 CTX 60 (SUTURE) IMPLANT
SUT PLAIN 0 NONE (SUTURE) IMPLANT
SUT PLAIN ABS 2-0 CT1 27XMFL (SUTURE) IMPLANT
SUT VIC AB 0 CTX36XBRD ANBCTRL (SUTURE) ×1 IMPLANT
SUT VIC AB 2-0 CT1 (SUTURE) IMPLANT
SUT VIC AB 2-0 CT1 TAPERPNT 27 (SUTURE) IMPLANT
SUT VIC AB 4-0 KS 27 (SUTURE) ×1 IMPLANT
SUTURE PLAIN GUT 2.0 ETHICON (SUTURE) IMPLANT
TOWEL OR 17X24 6PK STRL BLUE (TOWEL DISPOSABLE) ×1 IMPLANT
TRAY FOLEY W/BAG SLVR 14FR LF (SET/KITS/TRAYS/PACK) IMPLANT
WATER STERILE IRR 1000ML POUR (IV SOLUTION) ×1 IMPLANT

## 2024-04-14 NOTE — Anesthesia Procedure Notes (Signed)
 Epidural Patient location during procedure: OB Start time: 04/14/2024 11:07 AM End time: 04/14/2024 11:12 AM  Staffing Anesthesiologist: Mallory Manus, MD Performed: other anesthesia staff   Preanesthetic Checklist Completed: patient identified, IV checked, site marked, risks and benefits discussed, surgical consent, monitors and equipment checked, pre-op evaluation and timeout performed  Epidural Patient position: sitting Prep: DuraPrep and site prepped and draped Patient monitoring: continuous pulse ox and blood pressure Approach: midline Location: L1-L2 Injection technique: LOR air  Needle:  Needle type: Tuohy  Needle gauge: 17 G Needle length: 9 cm and 9 Needle insertion depth: 9 cm Catheter type: closed end flexible Catheter size: 19 Gauge Catheter at skin depth: 15 cm Test dose: negative  Assessment Events: blood not aspirated, no cerebrospinal fluid, injection not painful, no injection resistance, no paresthesia and negative IV test

## 2024-04-14 NOTE — Transfer of Care (Signed)
 Immediate Anesthesia Transfer of Care Note  Patient: Summer Campbell  Procedure(s) Performed: CESAREAN DELIVERY  Patient Location: PACU  Anesthesia Type:Epidural  Level of Consciousness: awake, alert , and oriented  Airway & Oxygen Therapy: Patient Spontanous Breathing  Post-op Assessment: Report given to RN and Post -op Vital signs reviewed and stable  Post vital signs: Reviewed and stable  Last Vitals:  Vitals Value Taken Time  BP 140/74 04/14/24 18:00  Temp 36.7 C 04/14/24 17:50  Pulse 88 04/14/24 18:01  Resp 29 04/14/24 18:01  SpO2 89 % 04/14/24 18:01  Vitals shown include unfiled device data.  Last Pain:  Vitals:   04/14/24 1750  TempSrc: Oral  PainSc: 0-No pain      Patients Stated Pain Goal: 3 (04/08/24 0810)  Complications: No notable events documented.

## 2024-04-14 NOTE — Progress Notes (Signed)
 Labor Progress Note Summer Campbell is a 33 y.o. G2P0010 at [redacted]w[redacted]d presented for IOL for severe pre-eclampsia superimposed on cHTN, FGR and Absent end-diastolic flow  S:  Comfortable, no concerns at this time  O:  BP (!) 151/71   Pulse 79   Temp 97.7 F (36.5 C) (Oral)   Resp 18   Ht 5' 4 (1.626 m)   Wt (!) 152 kg   LMP  (LMP Unknown)   SpO2 96%   BMI 57.52 kg/m  EFM: baseline 115 bpm/ minimal variability/ no accels/ no decels  Toco/IUPC: ctx q4-1min SVE: Dilation: 3 Effacement (%): 80 Cervical Position: Posterior Station: -3 Presentation: Vertex Exam by:: Amy Peterman, RN Pitocin: 8 mu/min  A/P: 33 y.o. G2P0010 [redacted]w[redacted]d  1. Labor: Continues on pitocin for augmentation, EFM changing between moderate to minimal variability 2. Pain: Per patient request 3. GBS unknown, PCN for ppx 4. PEC: on magnesium, continues with mild range pressures   [name], MD 9:03 AM

## 2024-04-14 NOTE — Discharge Summary (Signed)
 Postpartum Discharge Summary  Date of Service updated: 04/18/24     Patient Name: Summer Campbell DOB: [birth year] MRN: [id number]  Date of admission: 03/24/2024 Delivery date:04/14/2024 Delivering provider: LOLA DONNICE HERO Date of discharge: 04/18/2024  Admitting diagnosis: Chronic hypertension with superimposed preeclampsia [O11.9] Severe pre-eclampsia [O14.10] Intrauterine pregnancy: [redacted]w[redacted]d     Secondary diagnosis:  Principal Problem:   Chronic hypertension with superimposed preeclampsia Active Problems:   Herpes simplex infection of perianal skin   Type 2 diabetes mellitus without complication, with long-term current use of insulin  (HCC)   Maternal morbid obesity, antepartum (HCC)   Preexisting diabetes complicating pregnancy, antepartum   Preexisting hypertension complicating pregnancy, antepartum   Severe pre-eclampsia   IUGR (intrauterine growth restriction) affecting care of mother  Additional problems: n/a    Discharge diagnosis: Preterm Pregnancy Delivered, CHTN with superimposed preeclampsia, and Type 2 DM                                              Post partum procedures:none Augmentation: AROM, Pitocin, Cytotec, and IP Foley Complications: None  Hospital course: Induction of Labor With Cesarean Section   33 y.o. yo G2P0111 at [redacted]w[redacted]d was admitted to the hospital 03/24/2024 for induction of labor. Patient had a labor course significant for fetal intolerance of labor with inability to augment. The patient went for cesarean section due to Non-Reassuring FHR and reasons listed above. Delivery details are as follows: Membrane Rupture Time/Date: 12:30 PM,04/14/2024  Delivery Method:C-Section, Low Transverse Operative Delivery:N/A Details of operation can be found in separate operative Note.  Patient had a postpartum course complicated by persistent hypertension controlled with oral meidcations. She is ambulating, tolerating a regular diet, passing flatus, and  urinating well.  Patient is discharged home in stable condition on 04/18/24.      Newborn Data: Birth date:04/14/2024 Birth time:4:04 PM Gender:Female Living status:Living Apgars:5 ,6  Weight:1530 g                               Magnesium Sulfate received: Yes: Seizure prophylaxis BMZ received: Yes Rhophylac:N/A MMR:N/A T-DaP:Given prenatally Flu: No RSV Vaccine received: Yes Transfusion:No  Immunizations received: Immunization History  Administered Date(s) Administered    sv, Bivalent, Protein Subunit Rsvpref,pf (Abrysvo) 04/06/2024   DTP 06/08/1991, 08/04/1991, 11/11/1991, 07/13/1992   HIB, Unspecified 06/08/1991, 08/04/1991, 11/11/1991, 07/13/1992   Hep B, Unspecified 06/08/1991, 08/04/1991, 07/13/1992   Influenza Inj Mdck Quad Pf 03/15/2022   Influenza, Mdck, Trivalent,PF 6+ MOS(egg free) 05/15/2023   Measles / Rubella 07/13/1992   OPV 06/08/1991, 08/04/1991, 07/13/1992   PFIZER Comirnaty(Gray Top)Covid-19 Tri-Sucrose Vaccine 10/24/2020   PFIZER(Purple Top)SARS-COV-2 Vaccination 09/25/2019, 10/16/2019   Pfizer(Comirnaty)Fall Seasonal Vaccine 12 years and older 03/15/2022, 05/15/2023   Pneumococcal Polysaccharide-23 02/24/2017   Tdap 01/16/2017, 10/24/2020, 03/08/2024    Physical exam  Vitals:   04/17/24 1725 04/17/24 2010 04/17/24 2349 04/18/24 0426  BP:  131/62 (!) 146/57 (!) 151/73  Pulse:  78 82 74  Resp:  19 20 17   Temp:  98.3 F (36.8 C) 98.3 F (36.8 C) 98 F (36.7 C)  TempSrc:  Oral Oral Oral  SpO2: 99% 98% 97%   Weight:      Height:       General: alert, cooperative, and no distress Lochia: appropriate Uterine Fundus: firm Incision: intact underneath preveena  dressing DVT Evaluation: No evidence of DVT seen on physical exam. Negative Homan's sign. No cords or calf tenderness. Labs: Lab Results  Component Value Date   WBC 19.0 (H) 04/15/2024   HGB 10.6 (L) 04/15/2024   HCT 32.0 (L) 04/15/2024   MCV 88.6 04/15/2024   PLT 251 04/15/2024       Latest Ref Rng & Units 04/14/2024    6:46 PM  CMP  Glucose 70 - 99 mg/dL 843   BUN 6 - 20 mg/dL 13   Creatinine 9.55 - 1.00 mg/dL 9.26   Sodium 864 - 854 mmol/L 132   Potassium 3.5 - 5.1 mmol/L 4.6   Chloride 98 - 111 mmol/L 102   CO2 22 - 32 mmol/L 18   Calcium 8.9 - 10.3 mg/dL 7.3   Total Protein 6.5 - 8.1 g/dL 6.1   Total Bilirubin 0.0 - 1.2 mg/dL 0.6   Alkaline Phos 38 - 126 U/L 80   AST 15 - 41 U/L 28   ALT 0 - 44 U/L 35    Edinburgh Score:    04/15/2024   12:13 PM  Edinburgh Postnatal Depression Scale Screening Tool  I have been able to laugh and see the funny side of things. 0  I have looked forward with enjoyment to things. 0  I have blamed myself unnecessarily when things went wrong. 1  I have been anxious or worried for no good reason. 0  I have felt scared or panicky for no good reason. 0  Things have been getting on top of me. 1  I have been so unhappy that I have had difficulty sleeping. 0  I have felt sad or miserable. 0  I have been so unhappy that I have been crying. 0  The thought of harming myself has occurred to me. 0  Edinburgh Postnatal Depression Scale Total 2   No data recorded  After visit meds:  Allergies as of 04/18/2024   No Known Allergies      Medication List     STOP taking these medications    aspirin  EC 81 MG tablet   Calcium 600+D3 600-20 MG-MCG Tabs Generic drug: Calcium Carb-Cholecalciferol   famotidine 20 MG tablet Commonly known as: PEPCID   Labetalol  HCl 400 MG Tabs   NIFEdipine  60 MG 24 hr tablet Commonly known as: PROCARDIA  XL/NIFEDICAL XL   ondansetron  8 MG disintegrating tablet Commonly known as: Zofran  ODT       TAKE these medications    Acetaminophen  Extra Strength 500 MG Tabs Take 2 tablets (1,000 mg total) by mouth every 8 (eight) hours as needed.   ALPHA LIPOIC ACID PO Take 1 tablet by mouth daily at 6 (six) AM.   amLODipine  10 MG tablet Commonly known as: NORVASC  Take 1 tablet (10 mg  total) by mouth daily.   cyanocobalamin 1000 MCG tablet Commonly known as: VITAMIN B12 Take 1,000 mcg by mouth daily.   Dexcom G7 Sensor Misc Use one sensor every 10 days as instructed   escitalopram 20 MG tablet Commonly known as: LEXAPRO Take 20 mg by mouth daily.   furosemide 20 MG tablet Commonly known as: LASIX Take 1 tablet (20 mg total) by mouth 2 (two) times daily for 4 days. What changed:  medication strength how much to take when to take this   ibuprofen 600 MG tablet Commonly known as: ADVIL Take 1 tablet (600 mg total) by mouth every 6 (six) hours.   insulin  degludec 200 UNIT/ML FlexTouch Pen Commonly  known as: TRESIBA Inject 6 Units into the skin daily. What changed: how much to take   lisinopril 5 MG tablet Commonly known as: ZESTRIL Take 1 tablet (5 mg total) by mouth daily.   metFORMIN  500 MG tablet Commonly known as: GLUCOPHAGE  Take 2 tablets (1,000 mg total) by mouth 2 (two) times daily with a meal.   NovoLOG  FlexPen 100 UNIT/ML FlexPen Generic drug: insulin  aspart Inject 20 Units into the skin 3 (three) times daily with meals.   oxyCODONE  5 MG immediate release tablet Commonly known as: Oxy IR/ROXICODONE  Take 1-2 tablets (5-10 mg total) by mouth every 6 (six) hours as needed for moderate pain (pain score 4-6).   Pen Needles 32G X 4 MM Misc 1 each by Does not apply route.   potassium chloride SA 20 MEQ tablet Commonly known as: KLOR-CON M Take 1 tablet (20 mEq total) by mouth 2 (two) times daily for 4 days. What changed: when to take this   PRENATAL GUMMIES PO Take 2 tablets by mouth daily at 6 (six) AM.   Vitamin D-3 125 MCG (5000 UT) Tabs Take 1 tablet by mouth daily at 6 (six) AM.         Discharge home in stable condition Infant Feeding: Breast Infant Disposition:NICU Discharge instruction: per After Visit Summary and Postpartum booklet. Activity: Advance as tolerated. Pelvic rest for 6 weeks.  Diet: carb modified  diet Future Appointments: Future Appointments  Date Time Provider Department Center  04/22/2024 10:20 AM [facility] NURSE [facility]  05/25/2024  1:35 PM Hoyle Garre, NP [facility]   Follow up Visit:  Follow-up Information     [facility] Follow up.   Specialty: Family Medicine Why: Follow up in 1wk for a BP and incision check Contact information: [address] [place]  [address] [phone]                 Please schedule this patient for a In person postpartum visit in 6 weeks with the following provider: Any provider. Additional Postpartum F/U:Incision check 1 week and BP check 1 week  High risk pregnancy complicated by: HTN and T2DM Delivery mode:  C-Section, Low Transverse Anticipated Birth Control:  Vasectomy  Message sent to [facility] 11/12  04/18/2024 Jerilynn DELENA Buddle, MD

## 2024-04-14 NOTE — Progress Notes (Signed)
 Labor Progress Note Summer Campbell is a 34 y.o. G2P0010 at [redacted]w[redacted]d presented for IOL for severe pre-eclampsia superimposed on cHTN, FGR and absent end-diastolic flow  S:  Comfortable, feeling no contractions  O:  BP 107/69   Pulse 79   Temp 97.6 F (36.4 C) (Oral)   Resp 17   Ht 5' 4 (1.626 m)   Wt (!) 152 kg   LMP  (LMP Unknown)   SpO2 98%   BMI 57.52 kg/m  EFM: baseline 120 bpm/ minimal variability/ no accels/ recurrent late decels  Toco/IUPC: ctx q3-11min SVE: Dilation: 4 Effacement (%): 60 Cervical Position: Posterior Station: -3 Presentation: Vertex Exam by:: [employer] RN Pitocin: 8 mu/min  A/P: 33 y.o. G2P0010 [redacted]w[redacted]d  1. Labor: Continues on pitocin for augmentation, planned for AROM after epidural per patient request but given hypotension and late decelerations s/p epidural, decision was made to d/c epidural and monitor blood pressures prior to AROM, patient comfortable with this plan 2. Pain: Epidural d/c'ed, see above 3. GBS unknown, PCN ppx 4. PEC: asymptomatic, currently with hypotension d/t epidural, continues on magnesium   [name], MD 12:07 PM

## 2024-04-14 NOTE — Progress Notes (Signed)
 Labor Progress Note [name] is a 33 y.o. G2P0010 at [redacted]w[redacted]d presented for  IOL for severe pre-eclampsia superimposed on cHTN, FGR and absent end-diastolic flow   S:  Comfortable, is amenable to AROM and internal monitors  O:  BP (!) 118/44   Pulse 83   Temp 97.6 F (36.4 C) (Oral)   Resp 17   Ht 5' 4 (1.626 m)   Wt (!) 152 kg   LMP  (LMP Unknown)   SpO2 97%   BMI 57.52 kg/m  EFM: baseline 130 bpm/ minimal variability/ - accels/ recurrent late decels  Toco/IUPC: ctx q53min SVE: Dilation: 4 Effacement (%): 60 Cervical Position: Posterior Station: -3 Presentation: Vertex Exam by:: Dr. Jomarie Pitocin: 8 mu/min  A/P: 33 y.o. G2P0010 [redacted]w[redacted]d  1. Labor: AROM for clear fluid, continues on pitocin for augmentation, FSE and IUPC placed  2. Pain: Epidural when patient ready 3. GBS unknown, PCN ppx 4. PEC: asymptomatic, blood pressures improving after d/c epidural and ephedrine   [name], MD 12:39 PM

## 2024-04-14 NOTE — Progress Notes (Signed)
 Patient transferred from PACU to NICU to visit newborn, then to [facility] room.

## 2024-04-14 NOTE — Anesthesia Preprocedure Evaluation (Signed)
 Anesthesia Evaluation  Patient identified by MRN, date of birth, ID band Patient awake    Reviewed: Allergy & Precautions, H&P , NPO status , Patient's Chart, lab work & pertinent test results, reviewed documented beta blocker date and time   Airway Mallampati: II  TM Distance: >3 FB Neck ROM: full    Dental no notable dental hx.    Pulmonary neg pulmonary ROS   Pulmonary exam normal breath sounds clear to auscultation       Cardiovascular hypertension, Pt. on medications and Pt. on home beta blockers negative cardio ROS Normal cardiovascular exam Rhythm:regular Rate:Normal     Neuro/Psych  PSYCHIATRIC DISORDERS Anxiety Depression Bipolar Disorder   negative neurological ROS  negative psych ROS   GI/Hepatic negative GI ROS, Neg liver ROS,,,  Endo/Other  negative endocrine ROSdiabetes    Renal/GU negative Renal ROS  negative genitourinary   Musculoskeletal   Abdominal   Peds  Hematology negative hematology ROS (+)   Anesthesia Other Findings   Reproductive/Obstetrics (+) Pregnancy                              Anesthesia Physical Anesthesia Plan  ASA: 3  Anesthesia Plan: Epidural   Post-op Pain Management: Minimal or no pain anticipated   Induction:   PONV Risk Score and Plan: 2  Airway Management Planned: Natural Airway and Simple Face Mask  Additional Equipment: None  Intra-op Plan:   Post-operative Plan:   Informed Consent: I have reviewed the patients History and Physical, chart, labs and discussed the procedure including the risks, benefits and alternatives for the proposed anesthesia with the patient or authorized representative who has indicated his/her understanding and acceptance.       Plan Discussed with: Anesthesiologist and CRNA  Anesthesia Plan Comments:         Anesthesia Quick Evaluation

## 2024-04-14 NOTE — Plan of Care (Signed)
  Problem: Education: Goal: Knowledge of disease or condition will improve Outcome: Progressing Goal: Knowledge of the prescribed therapeutic regimen will improve Outcome: Progressing   Problem: Fluid Volume: Goal: Peripheral tissue perfusion will improve Outcome: Progressing   Problem: Clinical Measurements: Goal: Complications related to disease process, condition or treatment will be avoided or minimized Outcome: Progressing   Problem: Health Behavior/Discharge Planning: Goal: Ability to manage health-related needs will improve Outcome: Progressing   Problem: Clinical Measurements: Goal: Ability to maintain clinical measurements within normal limits will improve Outcome: Progressing Goal: Will remain free from infection Outcome: Progressing Goal: Diagnostic test results will improve Outcome: Progressing Goal: Respiratory complications will improve Outcome: Progressing Goal: Cardiovascular complication will be avoided Outcome: Progressing   Problem: Activity: Goal: Risk for activity intolerance will decrease Outcome: Progressing   Problem: Pain Managment: Goal: General experience of comfort will improve and/or be controlled Outcome: Progressing   Problem: Safety: Goal: Ability to remain free from injury will improve Outcome: Progressing   Problem: Skin Integrity: Goal: Risk for impaired skin integrity will decrease Outcome: Progressing   Problem: Education: Goal: Knowledge of disease or condition will improve Outcome: Progressing Goal: Knowledge of the prescribed therapeutic regimen will improve Outcome: Progressing Goal: Individualized Educational Video(s) Outcome: Progressing   Problem: Clinical Measurements: Goal: Complications related to the disease process, condition or treatment will be avoided or minimized Outcome: Progressing   Problem: Education: Goal: Ability to describe self-care measures that may prevent or decrease complications (Diabetes  Survival Skills Education) will improve Outcome: Progressing Goal: Individualized Educational Video(s) Outcome: Progressing   Problem: Coping: Goal: Ability to adjust to condition or change in health will improve Outcome: Progressing   Problem: Fluid Volume: Goal: Ability to maintain a balanced intake and output will improve Outcome: Progressing   Problem: Health Behavior/Discharge Planning: Goal: Ability to identify and utilize available resources and services will improve Outcome: Progressing Goal: Ability to manage health-related needs will improve Outcome: Progressing   Problem: Metabolic: Goal: Ability to maintain appropriate glucose levels will improve Outcome: Progressing   Problem: Nutritional: Goal: Maintenance of adequate nutrition will improve Outcome: Progressing Goal: Progress toward achieving an optimal weight will improve Outcome: Progressing   Problem: Skin Integrity: Goal: Risk for impaired skin integrity will decrease Outcome: Progressing   Problem: Tissue Perfusion: Goal: Adequacy of tissue perfusion will improve Outcome: Progressing   Problem: Education: Goal: Knowledge of Childbirth will improve Outcome: Progressing Goal: Ability to make informed decisions regarding treatment and plan of care will improve Outcome: Progressing Goal: Ability to state and carry out methods to decrease the pain will improve Outcome: Progressing Goal: Individualized Educational Video(s) Outcome: Progressing   Problem: Coping: Goal: Ability to verbalize concerns and feelings about labor and delivery will improve Outcome: Progressing   Problem: Life Cycle: Goal: Ability to make normal progression through stages of labor will improve Outcome: Progressing Goal: Ability to effectively push during vaginal delivery will improve Outcome: Progressing   Problem: Role Relationship: Goal: Will demonstrate positive interactions with the child Outcome: Progressing    Problem: Safety: Goal: Risk of complications during labor and delivery will decrease Outcome: Progressing   Problem: Pain Management: Goal: Relief or control of pain from uterine contractions will improve Outcome: Progressing

## 2024-04-14 NOTE — Op Note (Addendum)
 Cesarean Section Operative Note   Patient: Summer Campbell  Date of Procedure: 04/14/2024  Procedure: Primary Low Transverse Cesarean   Indications: non-reassuring fetal status and fetal intolerance of labor, unable to augment  Pre-operative Diagnosis: Primary Cesarean section for nonreassuring fetal heart tracing.   Post-operative Diagnosis: Same  TOLAC Candidate: Yes  Surgeon: Surgeons and Role:    * Lola Summer HERO, MD - Primary  Assistants: [name], MD  An experienced assistant was required given the standard of surgical care given the complexity of the case.  This assistant was needed for exposure, dissection, suctioning, retraction, instrument exchange, assisting with delivery with administration of fundal pressure, and for overall help during the procedure.   Anesthesia: epidural  Anesthesiologist: Mallory Manus, MD   Antibiotics: Cefazolin and Azithromycin   Estimated Blood Loss: 463  ml   Total IV Fluids: 1000 ml  Urine Output: 200 cc OF clear urine  Specimens: None   Complications: no complications   Indications: Summer Campbell is a 33 y.o. G2P0111 with an IUP [redacted]w[redacted]d presenting for unscheduled, urgent cesarean secondary to the indications listed above.  The risks of cesarean section discussed with the patient included but were not limited to: bleeding which may require transfusion or reoperation; infection which may require antibiotics; injury to bowel, bladder, ureters or other surrounding organs; injury to the fetus; need for additional procedures including hysterectomy in the event of a life-threatening hemorrhage; placental abnormalities with subsequent pregnancies, incisional problems, thromboembolic phenomenon and other postoperative/anesthesia complications. The patient concurred with the proposed plan, giving informed written consent for the procedure. Patient NPO status waived given urgency of case. Anesthesia and OR aware. Preoperative  prophylactic antibiotics and SCDs ordered on call to the OR.   Findings: Technically very challenging surgery due to habitus. Viable infant in cephalic presentation, no nuchal cord present. Apgars 5, 6, 7. Weight 1530 g. Clear amniotic fluid. Normal placenta, three vessel cord. Normal uterus, Normal bilateral fallopian tubes, Normal bilateral ovaries. No adhesive disease was encountered.  Procedure Details: A Time Out was held and the above information confirmed. The patient received intravenous antibiotics and had sequential compression devices applied to her lower extremities preoperatively. The patient was taken back to the operative suite where epidural anesthesia was administered. After induction of anesthesia, the patient was draped and prepped in the usual sterile manner and placed in a dorsal supine position with a leftward tilt. A low transverse skin incision was made with scalpel and carried down through a large layer of subcutaneous tissue measuring about 8-9 cm until the fascia was reached. Fascial incision was made and extended transversely, initially bluntly but due to difficulty with space was then done sharply with Mayo scissors. The fascia was separated from the underlying rectus tissue superiorly and inferiorly. The rectus muscles were separated in the midline bluntly and the peritoneum was entered bluntly. An Alexis retractor was placed to aid in visualization of the uterus. Due to inadequate window the skin incision was then extended bilaterally, the fascial incisions were extended bilaterally, and a partial Maylard incision was made on the R side, at which point the window was not generous but was adequate for gestational age. A bladder flap was not developed. A low transverse uterine incision was made. The infant was successfully delivered from cephalic presentation, the umbilical cord was clamped immediately. Cord ph was sent for both venous and arterial blood, and cord blood was obtained  for evaluation. The placenta was removed Intact and appeared normal. The  uterine incision was closed with a single layer running unlocked suture of 0-Monocryl. Overall, excellent hemostasis was noted. The abdomen and the pelvis were cleared of all clot and debris and the Thersia was removed. Hemostasis was confirmed on all surfaces.  The peritoneum was re-approximated with a remainder of 0 Monocryl suture in a purse string fashion, using a Fish device until the last moment. The fascia was then closed using 0 PDS in a running fashion. The subcutaneous layer was reapproximated with 4 layers of suture including vicryl for the first three layers and plain gut for the last. The skin was closed with a 4-0 vicryl subcuticular stitch. Given patient comorbidities a Prevena wound vac was placed. The patient tolerated the procedure well. Sponge, lap, instrument and needle counts were correct x 2. She was taken to the recovery room in stable condition.  Disposition: PACU - hemodynamically stable.    Signed: [name], MD OB Fellow, [facility] for [facility], [facility]     Attestation of Attending Supervision of Obstetric Fellow During Surgery: An experienced assistant was required given the standard of surgical care given the complexity of the case.  This assistant was needed for exposure, dissection, suctioning, retraction, instrument exchange, assisting with delivery with administration of fundal pressure, and for overall help during the procedure. Surgery was performed with the Obstetric Fellow under my supervision and collaboration.  I was present and scrubbed for the entire procedure.   I have reviewed the Obstetric Fellow's operative report, and I agree with the documentation except as noted below.  I have edited the above note for accuracy and clarity  Summer CHRISTELLA Carolus, MD/MPH Attending Family Medicine Physician, [facility] for [facility], [facility]

## 2024-04-14 NOTE — Lactation Note (Signed)
 This note was copied from a baby's chart. Lactation Consultation Note  Patient Name: Summer Campbell Date: 04/14/2024 Age:33 hours Reason for consult: Initial assessment;Primapara;1st time breastfeeding;NICU baby;Preterm <34wks;Infant < 5lbs;Maternal endocrine disorder  P1- Infant was born at [redacted]w[redacted]d GA weighing 1530g. Infant is admitted to the NICU due to prematurity. MOB is admitted to room 105. MOB reports feeding plan is to offer breast milk and DBM as needed. LC set up the hospital DEBP with size 18 mm flanges. LC reviewed how to use the pump and how to clean the parts. LC reviewed lactogenesis with the parents and stressed the importance of frequent breast stimulation. LC warned MOB that due to infant's prematurity, MOB may not collect any colostrum at first. [facility] reviewed the importance of still pumping every 3 hrs even if no colostrum is noted. MOB declined pumping at this time because she had just taken Benadryl and was starting to fall asleep. LC sent [facility] referral today. LC encouraged MOB to call for further assistance as needed.  Maternal Data Has patient been taught Hand Expression?: No Does the patient have breastfeeding experience prior to this delivery?: No  Feeding Mother's Current Feeding Choice: Breast Milk   Lactation Tools Discussed/Used Tools: Pump;Flanges Flange Size: 18 Breast pump type: Double-Electric Breast Pump;Manual Pump Education: Setup, frequency, and cleaning;Milk Storage Reason for Pumping: NICU admission, premature infant of [redacted]w[redacted]d GA Pumping frequency: 15-20 min every 3 hrs  Interventions Interventions: Breast feeding basics reviewed;Hand pump;DEBP;Education;LC Services brochure  Discharge Discharge Education: Engorgement and breast care;Warning signs for feeding baby;Outpatient recommendation Pump: Referral sent for [facility] Pump [facility] Program: Yes  Consult Status Consult Status: NICU follow-up Date: 04/15/24 Follow-up type:  In-patient    Recardo Hoit BS, IBCLC 04/14/2024, 9:22 PM

## 2024-04-14 NOTE — Progress Notes (Signed)
 Labor Progress Note TELECIA LAROCQUE is a 33 y.o. G2P0010 at [redacted]w[redacted]d presented for PreE with SF on cHTN, T2DM, FGR with end diastolic flow.   S:  Patient resting in the room with her jellyfish lamp. Starting to feel contractions more consistently.   O:  BP (!) 123/54   Pulse 79   Temp 97.9 F (36.6 C) (Oral)   Resp 16   Ht 5' 4 (1.626 m)   Wt (!) 152 kg   LMP  (LMP Unknown)   SpO2 99%   BMI 57.52 kg/m  EFM: 120/moderate variability/+accels, -decels  CVE: Dilation: Fingertip Effacement (%): 80 Cervical Position: Posterior Station: Ballotable Presentation: Vertex Exam by:: Greig Meyers, RN   A&P: 33 y.o. G2P0010 [redacted]w[redacted]d for PreE with SF on cHTN, T2DM, and FGR with end diastolic flow.   #Labor: Patient was fingertip on exam, but we were still able to place cooks catheter. Cervix has thinned out to 80. Started the patient on low-dose pitocin and will keep under 6 while foley bulb is in place.  #Pain: Controlled at this time.  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

## 2024-04-14 NOTE — Progress Notes (Signed)
 To bedside to discuss clinical situation with patient and partner.   At present patient has minimal variability and recurrent late decelerations while on pitocin, currently 4 cm and AROM'd, in setting of known IUGR with abnormal UAD and cHTN/Type 2 DM.  Discussed with patient that given she is remote from delivery with non-reassuring fetal status and inability to augment along with known placental insufficiency, I recommend proceeding with primary cesarean delivery.   Patient in agreement with plan.   The risks of cesarean section discussed with the patient included but were not limited to: bleeding which may require transfusion or reoperation; infection which may require antibiotics; injury to bowel, bladder, ureters or other surrounding organs; injury to the fetus; need for additional procedures including hysterectomy in the event of a life-threatening hemorrhage; placental abnormalities with subsequent pregnancies, incisional problems, thromboembolic phenomenon and other postoperative/anesthesia complications. The patient concurred with the proposed plan, giving informed written consent for the procedure. Patient NPO status waived given urgency of case. Anesthesia and OR aware. Preoperative prophylactic antibiotics and SCDs ordered on call to the OR.

## 2024-04-15 ENCOUNTER — Encounter (HOSPITAL_COMMUNITY): Payer: Self-pay | Admitting: Family Medicine

## 2024-04-15 ENCOUNTER — Other Ambulatory Visit (HOSPITAL_COMMUNITY)

## 2024-04-15 LAB — CBC
HCT: 32 % — ABNORMAL LOW (ref 36.0–46.0)
Hemoglobin: 10.6 g/dL — ABNORMAL LOW (ref 12.0–15.0)
MCH: 29.4 pg (ref 26.0–34.0)
MCHC: 33.1 g/dL (ref 30.0–36.0)
MCV: 88.6 fL (ref 80.0–100.0)
Platelets: 251 K/uL (ref 150–400)
RBC: 3.61 MIL/uL — ABNORMAL LOW (ref 3.87–5.11)
RDW: 13.7 % (ref 11.5–15.5)
WBC: 19 K/uL — ABNORMAL HIGH (ref 4.0–10.5)
nRBC: 0 % (ref 0.0–0.2)

## 2024-04-15 LAB — CULTURE, BETA STREP (GROUP B ONLY)

## 2024-04-15 LAB — GLUCOSE, CAPILLARY
Glucose-Capillary: 117 mg/dL — ABNORMAL HIGH (ref 70–99)
Glucose-Capillary: 179 mg/dL — ABNORMAL HIGH (ref 70–99)

## 2024-04-15 MED ORDER — COCONUT OIL OIL
1.0000 | TOPICAL_OIL | Status: DC | PRN
Start: 2024-04-15 — End: 2024-04-18

## 2024-04-15 MED ORDER — GABAPENTIN 300 MG PO CAPS
300.0000 mg | ORAL_CAPSULE | Freq: Two times a day (BID) | ORAL | Status: DC
  Administered 2024-04-15 – 2024-04-18 (×7): 300 mg via ORAL
  Filled 2024-04-15 (×7): qty 1

## 2024-04-15 MED ORDER — FERROUS SULFATE 325 (65 FE) MG PO TABS
325.0000 mg | ORAL_TABLET | ORAL | Status: DC
  Administered 2024-04-15 – 2024-04-17 (×2): 325 mg via ORAL
  Filled 2024-04-15 (×2): qty 1

## 2024-04-15 MED ORDER — AMLODIPINE BESYLATE 5 MG PO TABS
10.0000 mg | ORAL_TABLET | Freq: Every day | ORAL | Status: DC
  Administered 2024-04-15 – 2024-04-17 (×3): 10 mg via ORAL
  Filled 2024-04-15 (×3): qty 2

## 2024-04-15 MED ORDER — OXYTOCIN-SODIUM CHLORIDE 30-0.9 UT/500ML-% IV SOLN: 2.5000 [IU]/h | INTRAVENOUS | Status: AC

## 2024-04-15 MED ORDER — MEASLES, MUMPS & RUBELLA VAC ~~LOC~~ SUSR: 0.5000 mL | Freq: Once | SUBCUTANEOUS | Status: DC

## 2024-04-15 MED ORDER — ENOXAPARIN SODIUM 80 MG/0.8ML IJ SOSY
75.0000 mg | PREFILLED_SYRINGE | INTRAMUSCULAR | Status: DC
  Administered 2024-04-15 – 2024-04-16 (×2): 75 mg via SUBCUTANEOUS
  Filled 2024-04-15 (×3): qty 0.8

## 2024-04-15 MED ORDER — PRENATAL MULTIVITAMIN CH
1.0000 | ORAL_TABLET | Freq: Every day | ORAL | Status: DC
  Administered 2024-04-15 – 2024-04-17 (×3): 1 via ORAL
  Filled 2024-04-15 (×3): qty 1

## 2024-04-15 MED ORDER — ZOLPIDEM TARTRATE 5 MG PO TABS: 5.0000 mg | ORAL_TABLET | Freq: Every evening | ORAL | Status: DC | PRN

## 2024-04-15 MED ORDER — OXYCODONE HCL 5 MG PO TABS
5.0000 mg | ORAL_TABLET | ORAL | Status: DC | PRN
  Administered 2024-04-15 – 2024-04-18 (×6): 5 mg via ORAL
  Filled 2024-04-15 (×6): qty 1

## 2024-04-15 MED ORDER — SIMETHICONE 80 MG PO CHEW: 80.0000 mg | CHEWABLE_TABLET | ORAL | Status: DC | PRN

## 2024-04-15 MED ORDER — MENTHOL 3 MG MT LOZG
1.0000 | LOZENGE | OROMUCOSAL | Status: DC | PRN
  Filled 2024-04-15: qty 9

## 2024-04-15 MED ORDER — ACETAMINOPHEN 500 MG PO TABS
1000.0000 mg | ORAL_TABLET | Freq: Four times a day (QID) | ORAL | Status: DC
  Administered 2024-04-15 – 2024-04-18 (×12): 1000 mg via ORAL
  Filled 2024-04-15 (×12): qty 2

## 2024-04-15 MED ORDER — MAGNESIUM HYDROXIDE 400 MG/5ML PO SUSP: 30.0000 mL | ORAL | Status: DC | PRN

## 2024-04-15 MED ORDER — SENNOSIDES-DOCUSATE SODIUM 8.6-50 MG PO TABS
2.0000 | ORAL_TABLET | Freq: Every day | ORAL | Status: DC
  Administered 2024-04-15 – 2024-04-16 (×2): 2 via ORAL
  Filled 2024-04-15 (×2): qty 2

## 2024-04-15 MED ORDER — HYDROMORPHONE HCL 2 MG PO TABS: 2.0000 mg | ORAL_TABLET | ORAL | Status: DC | PRN

## 2024-04-15 MED ORDER — DIPHENHYDRAMINE HCL 25 MG PO CAPS: 25.0000 mg | ORAL_CAPSULE | Freq: Four times a day (QID) | ORAL | Status: DC | PRN

## 2024-04-15 MED ORDER — LACTATED RINGERS IV SOLN: INTRAVENOUS | Status: DC

## 2024-04-15 NOTE — Anesthesia Postprocedure Evaluation (Signed)
 Anesthesia Post Note  Patient: Summer Campbell  Procedure(s) Performed: CESAREAN DELIVERY     Patient location during evaluation: Mother Baby Anesthesia Type: Epidural Level of consciousness: awake and alert Pain management: pain level controlled Vital Signs Assessment: post-procedure vital signs reviewed and stable Respiratory status: spontaneous breathing, nonlabored ventilation and respiratory function stable Cardiovascular status: stable Postop Assessment: no headache, no backache and epidural receding Anesthetic complications: no   No notable events documented.  Last Vitals:  Vitals:   04/15/24 1632 04/15/24 2000  BP: 135/71 134/70  Pulse: 88 86  Resp: 18 18  Temp: 36.6 C 36.9 C  SpO2: 94% 95%    Last Pain:  Vitals:   04/15/24 2003  TempSrc:   PainSc: 4                  [name]

## 2024-04-15 NOTE — Lactation Note (Signed)
 This note was copied from a baby's chart. Lactation Consultation Note  Patient Name: Summer Campbell Date: 04/15/2024 Age:33 hours   LC was told by baby's RN, that FOB mentioned Mom was discarding her colostrum after pumping.  LC went in to consult with Mom, but she was sound asleep.  LC spoke with OBSC RN and she reassured LC that she had spoke with Mom regarding saving every drop for oral swabbing.    LC to f/u at a later time.  [name] 04/15/2024, 3:09 PM

## 2024-04-15 NOTE — Progress Notes (Signed)
 POSTPARTUM PROGRESS NOTE  Post op Day 1 Subjective:  Summer Campbell is a 33 y.o. [id number] [redacted]w[redacted]d s/p pltcs for fetal indications.  No acute events overnight.  Pt denies problems with ambulating, voiding or po intake.  She denies nausea or vomiting.  Pain is well controlled.  She has had flatus. She has not had bowel movement.  Lochia Small. No ha or vision change  Objective: Blood pressure 137/68, pulse 79, temperature 98.3 F (36.8 C), temperature source Oral, resp. rate 18, height 5' 4 (1.626 m), weight (!) 152 kg, SpO2 93%, unknown if currently breastfeeding.  Physical Exam:  General: alert, cooperative and no distress Lochia:normal flow Chest: CTAB Heart: RRR no m/r/g Abdomen: +BS, soft, nontender,  Uterine Fundus: firm, preveena in place DVT Evaluation: No calf swelling or tenderness Extremities: trace edema  Recent Labs    04/14/24 1846 04/15/24 0756  HGB 11.7* 10.6*  HCT 35.5* 32.0*    Assessment/Plan:  ASSESSMENT: Summer Campbell is a 33 y.o. [id number] [redacted]w[redacted]d s/p pltcs, doing well.  # s/p pltcs - lovenox - hgb and lochia appropriate - continue preveena - foley to d/c today  # chtn with superimposed severe preE By BPs. BPs well controlled currently, asymptomatic. On valsartan pre-pregnancy but birth control plans are iffy (maybe pill, maybe condoms, maybe vasectomy) - start amlodipine , is a safe and effective long-term med - continue lasix/k - continue mag 24 hours pp  # T2DM On glp-1 and metformin  and sglt2i pre-preg, insulin  during pregnancy. Current glucose well controlled - continue metformin  - continue sliding scale to determine if significant insulin  need, if so patient is interested in discharging on insulin   # contraception Undecided, considering pill option - consider pop at discharge  # feeding - breast and bottle    LOS: 22 days   Summer Campbell 04/15/2024, 8:38 AM

## 2024-04-16 ENCOUNTER — Ambulatory Visit: Admitting: Cardiology

## 2024-04-16 LAB — GLUCOSE, CAPILLARY: Glucose-Capillary: 183 mg/dL — ABNORMAL HIGH (ref 70–99)

## 2024-04-16 MED ORDER — FUROSEMIDE 20 MG PO TABS
20.0000 mg | ORAL_TABLET | Freq: Two times a day (BID) | ORAL | Status: DC
  Administered 2024-04-16 – 2024-04-18 (×5): 20 mg via ORAL
  Filled 2024-04-16 (×5): qty 1

## 2024-04-16 MED ORDER — POTASSIUM CHLORIDE CRYS ER 20 MEQ PO TBCR
40.0000 meq | EXTENDED_RELEASE_TABLET | Freq: Every day | ORAL | Status: DC
  Administered 2024-04-16 – 2024-04-17 (×2): 40 meq via ORAL
  Filled 2024-04-16 (×2): qty 2

## 2024-04-16 NOTE — Clinical Social Work Maternal (Signed)
 CLINICAL SOCIAL WORK MATERNAL/CHILD NOTE  Patient Details  Name: Summer Campbell MRN: [id number] Date of Birth: 12/06/1990  Date:  2024-03-19  Clinical Social Worker Initiating Note:  Summer Campbell, [place] Date/Time: Initiated:  04/16/24/1020     Child's Name:  Summer Campbell   Biological Parents:  Mother, Father (Father: Summer Campbell)   Need for Interpreter:  None   Reason for Referral:  Parental Support of Premature Babies < 32 weeks/or Critically Ill babies, Behavioral Health Concerns   Address:  [address] [place] [address]    Phone number:  [phone] (home)     Additional phone number: [phone] Summer Campbell  Household Members/Support Persons (HM/SP):   Household Member/Support Person 1   HM/SP Name Relationship DOB or Age  HM/SP -1 Summer Campbell FOB    HM/SP -2        HM/SP -3        HM/SP -4        HM/SP -5        HM/SP -6        HM/SP -7        HM/SP -8          Natural Supports (not living in the home):  Immediate Family, Extended Family, Friends   Herbalist: None   Employment: Environmental Education Officer   Type of Work: Mudlogger   Education:  Nature Conservation Officer in Psychology   Homebound arranged:    Surveyor, Quantity Resources:  [employer], Media Planner    Other Resources:  [facility]   Cultural/Religious Considerations Which May Impact Care:    Strengths:  Ability to meet basic needs  , Home prepared for child  , Understanding of illness, Psychotropic Medications   Psychotropic Medications:  Lexapro      Pediatrician:       Pediatrician List:   Radiographer, Therapeutic    [place]    [place] [facility]      Pediatrician Fax Number:    Risk Factors/Current Problems:  Mental Health Concerns     Cognitive State:  Able to Concentrate  , Alert  , Linear Thinking  , Goal Oriented     Mood/Affect:  Calm  , Comfortable  , Interested  , Happy     CSW Assessment: CSW met  with MOB at bedside to complete psychosocial assessment. CSW introduced self and explained role. MOB was welcoming, pleasant, and remained engaged during assessment. FOB was present and MOB granted CSW verbal permission to speak in front of FOB about anything. MOB reported that she resides with FOB and works full time as a Mudlogger at [employer]. MOB shared that her maternity leave was initially denied and she is awaiting appeal paperwork to complete in hopes of the leave being approved. MOB reported that they have most items needed to care for infant including a playpen that transitions into a crib, 2 bassinets, 2 car seats, lots of clothes, a stroller, a baby monitor, a bathtub, a breast pump, and cameras in the home. MOB shared that she needs assistance with diapers and bath items for infant. CSW informed MOB about [facility] [employer] [facility]'s [employer] if any assistance is needed obtaining items for infant, MOB reported that a referral for needed items would be helpful. CSW agreed to complete a referral, MOB thanked CSW. MOB shared that she also has to get backup medicine for infant. CSW inquired about MOB's support system, MOB reported  that she has more supports than she anticipated including her mom, grandmother, step sisters, step mom, Summer Campbell, FOB's cousin Summer Campbell, and friends.   CSW inquired about MOB's mental health history. MOB reported that she was diagnosed with Bipolar, Depression, and Anxiety at 33 years old. MOB denied any current symptoms. MOB shared that she is currently taking Lexapro, noting it is amazing and helpful. MOB reported that her PCP Summer Campbell Patient at [facility]) manages her Lexapro prescription and she plans to continue taking it. CSW inquired about any additional coping skills aside from medication, MOB reported that support from FOB is helpful. MOB reported that she is not participating in therapy and shared that it wasn't helpful in the past. CSW inquired about how MOB was  feeling emotionally since giving birth, MOB reported that she was feeling conflicted and elaborated stating she feels nervous, excited, and worried about being a first time mom. CSW acknowledged, normalized, and validated MOB's feelings. CSW and MOB discussed how a NICU admission can impact her mood. MOB asked what signs of PPD can FOB look for.   CSW provided education regarding the baby blues period vs. perinatal mood disorders, discussed treatment and gave resources for mental health follow up if concerns arise.  CSW recommends self-evaluation during the postpartum time period using the New Mom Checklist from Postpartum Progress and encouraged MOB to contact a medical professional if symptoms are noted at any time.    MOB presented calm and possessed insight about her mental health. MOB did not demonstrate any acute mental health signs/symptoms. CSW assessed for safety, MOB denied SI and HI.   CSW provided review of Sudden Infant Death Syndrome (SIDS) precautions.    CSW and MOB discussed infant's NICU admission. CSW informed parents about the NICU, what to expect, and resources/supports available while infant is admitted to the NICU. MOB reported that they feel well informed about infant's care and denied any transportation barriers with visiting infant in the NICU. MOB denied any questions/concerns regarding the NICU. CSW inquired about MOB's interest in community support for parenting education. MOB reported that she was interested and agreeable to a referral. MOB explained that she is nervous about being a first time mom. CSW informed MOB about resources in the NICU and encouraged MOB to engage with resources. CSW informed MOB about the Healthy Start program and agreed to complete a referral, MOB agreeable.   MOB was an appointment with [facility] 05-04-24. CSW inquired about any additional needs for resources/supports, MOB reported none.   CSW will continue to offer resources/supports while  infant is admitted to the NICU as MOB opted for CSW to check in weekly.   CSW will complete Healthy Start Referral.  CSW make referral to [facility]SN for requested items.  CSW will follow up with Financial Counselor to inquire about MOB's questions about infant's insurance.   CSW Plan/Description:  Sudden Infant Death Syndrome (SIDS) Education, Perinatal Mood and Anxiety Disorder (PMADs) Education, Psychosocial Support and Ongoing Assessment of Needs, Other Information/Referral to [employer], Other Patient/Family Education    [employer], LCSW May 18, 2024, 10:24 AM

## 2024-04-16 NOTE — Progress Notes (Signed)
 POSTPARTUM PROGRESS NOTE  Post op Day 2  Subjective:  Summer Campbell is a 33 y.o. H7E9888 [redacted]w[redacted]d s/p pltcs for FGR, pAEDF in the setting   No acute events overnight.  Pt denies problems with ambulating, voiding or po intake.  She denies nausea or vomiting.  Pain is well controlled.  She has had flatus. She has not had bowel movement.  Lochia Small. No ha or vision change  Objective: Blood pressure (!) 147/62, pulse 84, temperature 98.5 F (36.9 C), temperature source Oral, resp. rate 20, height 5' 4 (1.626 m), weight (!) 152 kg, SpO2 97%, unknown if currently breastfeeding.  Physical Exam:  General: alert, cooperative and no distress Lochia:normal flow Chest: CTAB Heart: RRR no m/r/g Abdomen: +BS, soft, nontender,  Uterine Fundus: firm, preveena in place DVT Evaluation: No calf swelling or tenderness Extremities: trace edema  Recent Labs    04/14/24 1846 04/15/24 0756  HGB 11.7* 10.6*  HCT 35.5* 32.0*    Assessment/Plan:  ASSESSMENT: Summer Campbell is a 33 y.o. H7E9888 POD#2 [redacted]w[redacted]d s/p pltcs, doing well.  # s/p pltcs - lovenox - hgb and lochia appropriate - continue preveena  # chtn with superimposed severe preE By BPs. BPs stable, she is asymptomatic. On valsartan pre-pregnancy but birth control plans are iffy (maybe pill, maybe condoms, maybe vasectomy) -  On amlodipine  10 mg daily, will continue this for BP control. - continue lasix/k - s/p mag 24 hours pp  # T2DM On glp-1 and metformin  and sglt2i pre-preg, insulin  during pregnancy. Current glucose well controlled - continue metformin  - continue sliding scale to determine if significant insulin  need, if so patient is interested in discharging on insulin   # contraception Undecided, considering pill option - consider pop at discharge  # feeding - breast and bottle  # dispo - hopeful for discharge tomorrow if BP remains stable    LOS: 23 days   Trachelle Low 04/16/2024, 4:39 PM

## 2024-04-17 ENCOUNTER — Other Ambulatory Visit (HOSPITAL_COMMUNITY): Payer: Self-pay

## 2024-04-17 LAB — GLUCOSE, CAPILLARY: Glucose-Capillary: 107 mg/dL — ABNORMAL HIGH (ref 70–99)

## 2024-04-17 MED ORDER — INSULIN GLARGINE-YFGN 100 UNIT/ML ~~LOC~~ SOLN
4.0000 [IU] | Freq: Every day | SUBCUTANEOUS | Status: DC
  Administered 2024-04-17: 4 [IU] via SUBCUTANEOUS
  Filled 2024-04-17 (×2): qty 0.04

## 2024-04-17 MED ORDER — LISINOPRIL 5 MG PO TABS
5.0000 mg | ORAL_TABLET | Freq: Every day | ORAL | 0 refills | Status: DC
  Filled 2024-04-17: qty 30, 30d supply, fill #0

## 2024-04-17 MED ORDER — LISINOPRIL 10 MG PO TABS: 5.0000 mg | ORAL_TABLET | Freq: Every day | ORAL | Status: DC

## 2024-04-17 MED ORDER — FUROSEMIDE 20 MG PO TABS
20.0000 mg | ORAL_TABLET | Freq: Two times a day (BID) | ORAL | 0 refills | Status: DC
  Filled 2024-04-17: qty 8, 4d supply, fill #0

## 2024-04-17 MED ORDER — IBUPROFEN 600 MG PO TABS
600.0000 mg | ORAL_TABLET | Freq: Four times a day (QID) | ORAL | 0 refills | Status: AC
  Filled 2024-04-17: qty 30, 8d supply, fill #0

## 2024-04-17 MED ORDER — ENALAPRIL MALEATE 5 MG PO TABS
5.0000 mg | ORAL_TABLET | Freq: Every day | ORAL | 0 refills | Status: DC
  Filled 2024-04-17: qty 30, 30d supply, fill #0

## 2024-04-17 MED ORDER — AMLODIPINE BESYLATE 10 MG PO TABS
10.0000 mg | ORAL_TABLET | Freq: Every day | ORAL | 0 refills | Status: AC
  Filled 2024-04-17: qty 30, 30d supply, fill #0

## 2024-04-17 MED ORDER — ENALAPRIL MALEATE 2.5 MG PO TABS
5.0000 mg | ORAL_TABLET | Freq: Every day | ORAL | Status: DC
  Administered 2024-04-17: 5 mg via ORAL
  Filled 2024-04-17: qty 2

## 2024-04-17 MED ORDER — OXYCODONE HCL 5 MG PO TABS
5.0000 mg | ORAL_TABLET | Freq: Four times a day (QID) | ORAL | 0 refills | Status: AC | PRN
  Filled 2024-04-17: qty 20, 3d supply, fill #0

## 2024-04-17 MED ORDER — ACETAMINOPHEN 500 MG PO TABS
1000.0000 mg | ORAL_TABLET | Freq: Three times a day (TID) | ORAL | 0 refills | Status: AC | PRN
  Filled 2024-04-17: qty 30, 5d supply, fill #0

## 2024-04-17 MED ORDER — POTASSIUM CHLORIDE CRYS ER 20 MEQ PO TBCR
20.0000 meq | EXTENDED_RELEASE_TABLET | Freq: Two times a day (BID) | ORAL | 0 refills | Status: DC
  Filled 2024-04-17: qty 8, 4d supply, fill #0
  Filled 2024-04-17: qty 4, 4d supply, fill #0

## 2024-04-17 MED ORDER — INSULIN DEGLUDEC 200 UNIT/ML ~~LOC~~ SOPN
6.0000 [IU] | PEN_INJECTOR | SUBCUTANEOUS | 0 refills | Status: AC
  Filled 2024-04-17: qty 3, 90d supply, fill #0

## 2024-04-17 NOTE — Lactation Note (Signed)
 This note was copied from a baby's chart.  NICU Lactation Consultation Note  Patient Name: Summer Campbell Date: 04/17/2024 Age:33 hours  Reason for consult: Follow-up assessment; Primapara; 1st time breastfeeding; NICU baby; Preterm <34wks; Infant < 5lbs; Maternal endocrine disorder; Other (Comment); Maternal discharge (cHTN, Pre-E, IUGR) Type of Endocrine Disorder?: Diabetes (T2DM (insulin ))  SUBJECTIVE Visited with family of 82 34/57 weeks old AGA NICU female; baby Summer Campbell got admitted due to prematurity. Summer Campbell is a P1 and reported she's been pumping and already getting enough colostrum to collect, praised her for all her efforts. Noticed that pumping hasn't been consistent. She was expecting to be discharged from [facility] but her D/C got postponed due to severe BP ranges.   She has been using the # 18 flanges but they felt uncomfortable, resized flange size due to areolar edema. She inquired about prevention/treatment of sore nipples, both nipples are intact, she was just curious about it. Reviewed discharge education, pumping schedule, pumping log, pump settings, secretory activation, lactogenesis III and anticipatory guidelines.   OBJECTIVE Infant data: Mother's Current Feeding Choice: Breast Milk and Donor Milk  O2 Device: CPAP FiO2 (%): 21 %  Infant feeding assessment IDFTS - Readiness: 3   Maternal data: H7E9888 C-Section, Low Transverse Pumping frequency: 3 times/24 hours Pumped volume: 10 mL Flange Size: 21 (Resized to # 21 flanges on 04/17/2024) Risk factor for low/delayed milk supply:: primipara, prematurity, T2DM, IUGR, infant separation  WIC Program: Yes WIC Referral Sent?: Yes What county?: [place] Pump: Received Stork Pump (Spectra  S2 Plus)  ASSESSMENT Infant: Feeding Status: Scheduled 9-12-3-6 Feeding method: Tube/Gavage (Bolus)  Maternal: Milk volume: Normal  INTERVENTIONS/PLAN Interventions: Interventions: Breast feeding basics  reviewed; Coconut oil; DEBP; Education; NICU Pumping Log Discharge Education: Engorgement and breast care Tools: Flanges; Pump; Coconut oil Pump Education: Setup, frequency, and cleaning; Milk Storage  Plan: STS around care times  Pump both breasts on initiate mode every 3 hours for 15 minutes, ideally 8 pumping sessions/24 hours Switch to maintain mode once expressing +20 ml of EBM combined Bring all pump pieces to baby's room after her discharge  No other support person at this time. All questions and concerns answered, family to contact [facility] services PRN.  Consult Status: NICU follow-up   Summer Campbell 04/17/2024, 5:34 PM

## 2024-04-17 NOTE — Progress Notes (Signed)
 POSTPARTUM PROGRESS NOTE  POD #3  Subjective:  Summer Campbell is a 33 y.o. H7E9888 s/p pLTCS at [redacted]w[redacted]d. Today she notes she is doing well and feeling ready for discharge. She denies any problems with ambulating, voiding or po intake. Denies nausea or vomiting. She has passed flatus, +BM.  Pain is well controlled.  Lochia minimal Denies fever/chills/chest pain/SOB.  no HA, no blurry vision, noRUQ pain  Objective: Blood pressure (!) 147/73, pulse 72, temperature 99.2 F (37.3 C), temperature source Oral, resp. rate 19, height 5' 4 (1.626 m), weight (!) 152 kg, SpO2 99%, unknown if currently breastfeeding.  Physical Exam:  General: alert, cooperative and no distress Chest: no respiratory distress, CTAB Heart: regular rate and rhythm Abdomen: obese, soft, nontender, +BS Uterine Fundus: firm, appropriately tender Incision: prevena in place- clean and dry DVT Evaluation: No calf swelling or tenderness Extremities: 1+ edema Skin: warm, dry  Results for orders placed or performed during the hospital encounter of 03/24/24 (from the past 24 hours)  Glucose, capillary     Status: Abnormal   Collection Time: 04/17/24  8:43 AM  Result Value Ref Range   Glucose-Capillary 107 (H) 70 - 99 mg/dL    Assessment/Plan: Summer Campbell is a 33 y.o. G2P0111 s/p pLTCS at [redacted]w[redacted]d POD#3 complicated by: 1) chronic HTN with superimposed preeclampsia -s/p Mag x24h -currently on zotepine 10 mg daily, will add enalapril 5 -Patient currently asymptomatic  2) type 2 diabetes -currently on metformin  and SSI -will restart Tresiba 6units daily -f/u with PCP as outpatient  3) postop -meeting milestones appropriately -Lovenox for DVT prophylaxis  Contraception: Considering vasectomy Feeding: Baby in NICU, plans on breast-feeding  Dispo: Postop care as outlined above, possible discharge home later today if BP stable   LOS: 24 days   Joss Mcdill, DO Faculty Attending, [facility] 04/17/2024, 12:18 PM

## 2024-04-18 LAB — GLUCOSE, CAPILLARY: Glucose-Capillary: 101 mg/dL — ABNORMAL HIGH (ref 70–99)

## 2024-04-19 LAB — SURGICAL PATHOLOGY

## 2024-04-19 NOTE — Lactation Note (Signed)
 This note was copied from a baby's chart.  NICU Lactation Consultation Note  Patient Name: Summer Campbell Date: 04/19/2024 Age:33 days  Reason for consult: Follow-up assessment; Primapara; 1st time breastfeeding; NICU baby; Preterm <34wks; Infant < 5lbs; Maternal endocrine disorder; Other (Comment) (cHTN, Pre-E, IUGR) Type of Endocrine Disorder?: Diabetes (T2DM (insulin ))  SUBJECTIVE Visited with family of 68 62/46 weeks old AGA NICU female Summer Campbell; Summer Campbell is a P1 and reported she's now pumping more consistently and her supply remained stable; praised her for all her efforts. She voiced she wanted to speak with lactation but couldn't find our phone number. Provided another [facility] brochure and let her know that we are here at least 5 days/week. Asked her to call out via baby's nurse if at the hospital for faster service.   She has her [facility] appt schedule for 04/21/2024 to pick up her Symphony pump. Reviewed pump settings and strategies to increase supply such as power pumping, STS care and the use of a hospital grade pump.   OBJECTIVE Infant data: Mother's Current Feeding Choice: Breast Milk and Donor Milk  O2 Device: Room Air FiO2 (%): 21 %  Infant feeding assessment IDFTS - Readiness: 3   Maternal data: G2P0111 C-Section, Low Transverse Pumping frequency: 6 times/24 hours Pumped volume: 10 mL  WIC Program: Yes WIC Referral Sent?: Yes What county?: [place] Pump: Received Stork Pump (Spectra  S2 Plus)  ASSESSMENT Infant: Feeding Status: Scheduled 9-12-3-6 Feeding method: Tube/Gavage (Bolus)  Maternal: Milk volume: Low  INTERVENTIONS/PLAN Interventions: Interventions: Breast feeding basics reviewed; Coconut oil; DEBP; LC Services brochure; Education  Plan: STS around care times; pump right after Pump both breasts on maintain mode every 3 hours for 30 minutes, ideally 8 pumping sessions/24 hours Power pump twice daily    FOB present. All questions and concerns  answered, family to contact [facility] services PRN.  Consult Status: NICU follow-up NICU Follow-up type: Weekly NICU follow up   Summer Campbell S Miriam 04/19/2024, 7:03 PM

## 2024-04-20 ENCOUNTER — Encounter: Admitting: Family Medicine

## 2024-04-22 ENCOUNTER — Other Ambulatory Visit

## 2024-04-22 ENCOUNTER — Ambulatory Visit: Admitting: *Deleted

## 2024-04-22 ENCOUNTER — Other Ambulatory Visit: Payer: Self-pay

## 2024-04-22 ENCOUNTER — Ambulatory Visit

## 2024-04-22 VITALS — BP 160/85 | HR 89 | Ht 64.0 in | Wt 324.9 lb

## 2024-04-22 DIAGNOSIS — E1159 Type 2 diabetes mellitus with other circulatory complications: Secondary | ICD-10-CM

## 2024-04-22 DIAGNOSIS — I152 Hypertension secondary to endocrine disorders: Secondary | ICD-10-CM

## 2024-04-22 MED ORDER — LISINOPRIL 20 MG PO TABS: 20.0000 mg | ORAL_TABLET | Freq: Every day | ORAL | 0 refills | Status: DC

## 2024-04-22 NOTE — Progress Notes (Addendum)
 Here for BP check and prevena removal s/p C/S 04/14/24 for NRFHR at 33.1weeks. BP today 159/ 89 and repeat 160/85. Denies HA took Lisinopril 5mg   about 10 this am. Still has 1+ edema in feet/ ankles/ states did not change since discharge. Wound vac removed slowly and easily. Noted dressing saturated with dark red and fresh red drainage. Wound closed and intact except middle 1inch slightly open with bloody red discharge noted. Dr. Zina in to assess wound. He measured at 1 inch wide and 0.5 inch deep. He packed iodoform gauze into wound on each side of a stitch- so 2 pieces of gauze. Covered with 4x4 and abd pad. Advised to go to [facility] Saturday for wound / dressing /pack change and then made office visit for Tuesday 11/25 for wound dressing /pack change. Reviewed wound care. Give extra 4x4's and abd pads to use at home. Dr. Zina advised she increase LIsinopril to  20 mg daily . New RX sent to pharamcy. Also will check BP at wound check appointment. Patient voices understanding. I called [facility] and notified of plan for patient to come for wound repack.  Rock Skip PEAK

## 2024-04-24 ENCOUNTER — Other Ambulatory Visit: Payer: Self-pay

## 2024-04-24 ENCOUNTER — Inpatient Hospital Stay (HOSPITAL_COMMUNITY)
Admission: AD | Admit: 2024-04-24 | Discharge: 2024-04-24 | Disposition: A | Discharge status: Home / Self Care | Attending: Obstetrics & Gynecology | Admitting: Obstetrics & Gynecology

## 2024-04-24 DIAGNOSIS — O900 Disruption of cesarean delivery wound: Secondary | ICD-10-CM | POA: Diagnosis present

## 2024-04-24 DIAGNOSIS — Z79899 Other long term (current) drug therapy: Secondary | ICD-10-CM | POA: Insufficient documentation

## 2024-04-24 DIAGNOSIS — T8149XA Infection following a procedure, other surgical site, initial encounter: Secondary | ICD-10-CM

## 2024-04-24 DIAGNOSIS — O8601 Infection of obstetric surgical wound, superficial incisional site: Secondary | ICD-10-CM | POA: Diagnosis not present

## 2024-04-24 DIAGNOSIS — O1003 Pre-existing essential hypertension complicating the puerperium: Secondary | ICD-10-CM | POA: Insufficient documentation

## 2024-04-24 DIAGNOSIS — Z5189 Encounter for other specified aftercare: Secondary | ICD-10-CM

## 2024-04-24 DIAGNOSIS — Z98891 History of uterine scar from previous surgery: Secondary | ICD-10-CM

## 2024-04-24 DIAGNOSIS — Z4801 Encounter for change or removal of surgical wound dressing: Secondary | ICD-10-CM

## 2024-04-24 MED ORDER — ACETAMINOPHEN 325 MG PO TABS: 650.0000 mg | ORAL_TABLET | Freq: Four times a day (QID) | ORAL | 0 refills | Status: AC | PRN

## 2024-04-24 MED ORDER — IBUPROFEN 600 MG PO TABS: 600.0000 mg | ORAL_TABLET | Freq: Four times a day (QID) | ORAL | 0 refills | Status: AC | PRN

## 2024-04-24 MED ORDER — SULFAMETHOXAZOLE-TRIMETHOPRIM 800-160 MG PO TABS
1.0000 | ORAL_TABLET | Freq: Two times a day (BID) | ORAL | 0 refills | Status: AC
Start: 2024-04-24 — End: 2024-05-04

## 2024-04-24 NOTE — Discharge Instructions (Signed)
 You were seen today for an incision check and for a removal of packing of surgical incision.  The incision was repacked with iodoform dressing which was provided to you along with additional dressing material.  Please keep the area clean and dry and change the dressing daily follow-up as scheduled in the office.  I have started you on an antibiotic called Bactrim  double strength.  Please take all the medication as prescribed along with your pain medication as needed.  I have sent cultures off today and we will contact you if any changes in antibiotics are needed and/or if we can stop the antibiotics.  Please contact us  with any questions or concerns or you can call the office.  It was a pleasure seeing you today. I hope you start feeling well soon !  Olam Dalton, MSN, [facility], Center for [employer]

## 2024-04-24 NOTE — MAU Provider Note (Signed)
 S/HPI  [name] is a 33 y.o. [phone] patient who presents to MAU today with complaint of patient reports she is here today for a wound cement.  She was in the office and had a wound VAC removed on 04/23/23 and the wound had a mid incision area that was open with serosanguineous drainage.   The area was packed with iodoform packing and a new sterile dressing was applied by OB Attending. The  patient was told  to come in for a dressing change and repacking of the wound.    She denies any abdominal pain, foul-smelling drainage, fever or chills and offers no complaints overall.  The patient is a chronic hypertensive on medication that has recently been increased which she states she is she is compliant with.  Her prenatal care is received at [facility] and she is scheduled for follow-up on 04/28/2023    O BP (!) (P) 144/77 (BP Location: Right Arm)   Pulse 98   Temp 98.1 F (36.7 C) (Oral)   Resp 16   SpO2 99%  Physical Exam Vitals and nursing note reviewed. Exam conducted with a chaperone present.  Constitutional:      General: She is not in acute distress.    Appearance: Normal appearance. She is obese. She is not ill-appearing.  Cardiovascular:     Rate and Rhythm: Normal rate.  Abdominal:     General: A surgical scar is present.     Palpations: Abdomen is soft.     Tenderness: There is no abdominal tenderness. There is no guarding or rebound.      Comments: Serosanguineous discharge with a foul odor after removal of the dressing and the packing guaze was removed. CX obtained . Disruption of wound is ~ 1 mm across and 0.5 mm in depth, fascia is intact. New packing strips were placed and sterile dressing was applied. The patient was given additional supplies for home dressing changes and has f/u scheduled in the office. Procedure was tolerated well.   Musculoskeletal:        General: Normal range of motion.     Cervical back: Normal range of motion.  Skin:    General: Skin is  warm.  Neurological:     Mental Status: She is alert and oriented to person, place, and time.  Psychiatric:        Mood and Affect: Mood normal.        Behavior: Behavior normal.     MDM  MODERATE/Procedure   Wound Assessment and  Dressing change assisted by Rhoda Keys , RN  Anaerobic and aerobic CX obtained Bactrim  DS started for suspected infection due to serosanguineous discharge with foul odor   Orders Placed This Encounter  Procedures   Anaerobic culture w Gram Stain    Standing Status:   Standing    Number of Occurrences:   1   Aerobic Culture w Gram Stain (superficial specimen)    Standing Status:   Standing    Number of Occurrences:   1   Discharge patient Discharge disposition: 01-Home or Self Care; Discharge patient date: 04/24/2024    Standing Status:   Standing    Number of Occurrences:   1    Discharge disposition:   01-Home or Self Care [1]    Discharge patient date:   04/24/2024      I have reviewed the patient chart and performed the physical exam  A/P as described below.  Counseling and education provided and  patient agreeable  with plan as described below. Verbalized understanding.    ASSESSMENT Medical screening exam complete Wound infection after surgery  H/O cesarean section  Visit for wound care  Dressing change or removal, surgical wound    PLAN Future Appointments  Date Time Provider Department Center  04/27/2024  8:30 AM [facility] NURSE [facility]  05/25/2024  1:35 PM Hoyle Garre, NP [facility]    Discharge from MAU in stable condition See AVS for full description of educational information and instructions provided to the patient at time of discharge  Warning signs for worsening condition that would warrant emergency follow-up discussed Patient may return to MAU as needed   Littie Olam LABOR, NP 04/24/2024 2:38 PM   Allergies as of 04/24/2024   No Known Allergies      Medication List     TAKE these medications     Acetaminophen  Extra Strength 500 MG Tabs Take 2 tablets (1,000 mg total) by mouth every 8 (eight) hours as needed. What changed: Another medication with the same name was added. Make sure you understand how and when to take each.   acetaminophen  325 MG tablet Commonly known as: Tylenol  Take 2 tablets (650 mg total) by mouth every 6 (six) hours as needed. What changed: You were already taking a medication with the same name, and this prescription was added. Make sure you understand how and when to take each.   ALPHA LIPOIC ACID PO Take 1 tablet by mouth daily at 6 (six) AM.   amLODipine  10 MG tablet Commonly known as: NORVASC  Take 1 tablet (10 mg total) by mouth daily.   cyanocobalamin 1000 MCG tablet Commonly known as: VITAMIN B12 Take 1,000 mcg by mouth daily.   Dexcom G7 Sensor Misc Use one sensor every 10 days as instructed   escitalopram  20 MG tablet Commonly known as: LEXAPRO  Take 20 mg by mouth daily.   ibuprofen  600 MG tablet Commonly known as: ADVIL  Take 1 tablet (600 mg total) by mouth every 6 (six) hours. What changed: Another medication with the same name was added. Make sure you understand how and when to take each.   ibuprofen  600 MG tablet Commonly known as: ADVIL  Take 1 tablet (600 mg total) by mouth every 6 (six) hours as needed. What changed: You were already taking a medication with the same name, and this prescription was added. Make sure you understand how and when to take each.   insulin  degludec 200 UNIT/ML FlexTouch Pen Commonly known as: TRESIBA  Inject 6 Units into the skin daily.   lisinopril  5 MG tablet Commonly known as: ZESTRIL  Take 1 tablet (5 mg total) by mouth daily.   lisinopril  20 MG tablet Commonly known as: ZESTRIL  Take 1 tablet (20 mg total) by mouth daily.   metFORMIN  500 MG tablet Commonly known as: GLUCOPHAGE  Take 2 tablets (1,000 mg total) by mouth 2 (two) times daily with a meal.   NovoLOG  FlexPen 100 UNIT/ML  FlexPen Generic drug: insulin  aspart Inject 20 Units into the skin 3 (three) times daily with meals.   oxyCODONE  5 MG immediate release tablet Commonly known as: Oxy IR/ROXICODONE  Take 1-2 tablets (5-10 mg total) by mouth every 6 (six) hours as needed for moderate pain (pain score 4-6).   Pen Needles 32G X 4 MM Misc 1 each by Does not apply route.   PRENATAL GUMMIES PO Take 2 tablets by mouth daily at 6 (six) AM.   sulfamethoxazole -trimethoprim  800-160 MG tablet Commonly known as: BACTRIM  DS  Take 1 tablet by mouth 2 (two) times daily for 10 days.   Vitamin D-3 125 MCG (5000 UT) Tabs Take 1 tablet by mouth daily at 6 (six) AM.        Olam Dalton, MSN, [facility], Center for [facility]   This chart was dictated using voice recognition software, Dragon. Despite the best efforts of this provider to proofread and correct errors, errors may still occur which can change documentation meaning.

## 2024-04-24 NOTE — MAU Note (Signed)
 Summer Campbell is a 33 y.o. at Unknown here in MAU reporting: postpartum c/s on 04/14/24 Wound vac removed on 11/20 and wound on mid incision was packed and dressing reapplied. Patient here today for wound assessment and packing of wound. Denies any new incisional issues, denies any new incisional drainage, pain or redness. States things feel sore but it is not bad. Denies any foul odor.   Pain score: 2 Vitals:   04/24/24 1339  BP: (!) 145/58  Pulse: 99  Resp: 16  Temp: 98.1 F (36.7 C)     Lab orders placed from triage:

## 2024-04-27 ENCOUNTER — Other Ambulatory Visit: Payer: Self-pay

## 2024-04-27 ENCOUNTER — Ambulatory Visit: Admitting: Family Medicine

## 2024-04-27 VITALS — BP 157/94 | HR 91 | Ht 64.0 in | Wt 329.6 lb

## 2024-04-27 DIAGNOSIS — Z0130 Encounter for examination of blood pressure without abnormal findings: Secondary | ICD-10-CM

## 2024-04-27 DIAGNOSIS — Z4889 Encounter for other specified surgical aftercare: Secondary | ICD-10-CM

## 2024-04-27 NOTE — Progress Notes (Signed)
 Summer Campbell is here in office today for a wound check and BP check.   Brittan delivered 04/14/24 C/S.  Last dressing change at MAU 04/24/24 Wound Vac removal 04/22/24  Patient denies headache, dizziness, and vision changes.   BP- 157/94 Patient states I have been taking my medications, but not this morning. Patient was prescribed Norvasc  10 mg and Lisinopril  20 mg. Patient has edema +1 feet and ankles. Patient states incision is sore; would say pain is 1/10. Patient states the wound packing came out. I removed the abd pad old drainage on dressing. There is some clear drainage at the site. The wound is mostly intact and no edema. There is a 1-inch opening, and the skin is pink- (patients left).   Provider in to see patient. Advised patient to take her medications as prescribed. Silver nitrate applied to the site. Reviewed daily wound care with patient and she voiced understanding. Reviewed MAU precautions & pre-eclampsia s/s patient verbalized understanding. Patient needs to follow-up in 1 week for incision/BP check. Patient has no further questions or concerns.   [name], RN 04/27/24

## 2024-04-28 ENCOUNTER — Ambulatory Visit: Payer: Self-pay | Admitting: Family Medicine

## 2024-04-28 LAB — COMPREHENSIVE METABOLIC PANEL WITH GFR
ALT: 19 IU/L (ref 0–32)
AST: 16 IU/L (ref 0–40)
Albumin: 3.4 g/dL — ABNORMAL LOW (ref 3.9–4.9)
Alkaline Phosphatase: 83 IU/L (ref 41–116)
BUN/Creatinine Ratio: 33 — ABNORMAL HIGH (ref 9–23)
BUN: 21 mg/dL — ABNORMAL HIGH (ref 6–20)
Bilirubin Total: <0.2 mg/dL (ref 0.0–1.2)
CO2: 23 mmol/L (ref 20–29)
Calcium: 9.5 mg/dL (ref 8.7–10.2)
Chloride: 102 mmol/L (ref 96–106)
Creatinine, Ser: 0.64 mg/dL (ref 0.57–1.00)
Globulin, Total: 2.8 g/dL (ref 1.5–4.5)
Glucose: 138 mg/dL — ABNORMAL HIGH (ref 70–99)
Potassium: 5.2 mmol/L (ref 3.5–5.2)
Sodium: 139 mmol/L (ref 134–144)
Total Protein: 6.2 g/dL (ref 6.0–8.5)
eGFR: 120 mL/min/1.73 (ref >59)

## 2024-04-29 LAB — AEROBIC/ANAEROBIC CULTURE W GRAM STAIN (SURGICAL/DEEP WOUND)

## 2024-04-30 ENCOUNTER — Other Ambulatory Visit: Payer: Self-pay

## 2024-04-30 ENCOUNTER — Inpatient Hospital Stay (HOSPITAL_COMMUNITY)
Admission: AD | Admit: 2024-04-30 | Discharge: 2024-04-30 | Disposition: A | Discharge status: Home / Self Care | Attending: Obstetrics and Gynecology | Admitting: Obstetrics and Gynecology

## 2024-04-30 DIAGNOSIS — Y92410 Unspecified street and highway as the place of occurrence of the external cause: Secondary | ICD-10-CM | POA: Diagnosis not present

## 2024-04-30 DIAGNOSIS — M542 Cervicalgia: Secondary | ICD-10-CM | POA: Insufficient documentation

## 2024-04-30 DIAGNOSIS — Z4800 Encounter for change or removal of nonsurgical wound dressing: Secondary | ICD-10-CM | POA: Insufficient documentation

## 2024-04-30 DIAGNOSIS — Z5189 Encounter for other specified aftercare: Secondary | ICD-10-CM
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
## 2024-05-03 ENCOUNTER — Ambulatory Visit: Payer: Self-pay | Admitting: Obstetrics & Gynecology

## 2024-05-04 ENCOUNTER — Encounter: Admitting: Obstetrics & Gynecology

## 2024-05-05 ENCOUNTER — Ambulatory Visit: Admitting: Family Medicine

## 2024-05-05 ENCOUNTER — Other Ambulatory Visit: Payer: Self-pay

## 2024-05-05 VITALS — BP 159/103 | HR 99 | Ht 64.0 in | Wt 320.3 lb

## 2024-05-05 DIAGNOSIS — Z0130 Encounter for examination of blood pressure without abnormal findings: Secondary | ICD-10-CM | POA: Diagnosis not present

## 2024-05-05 DIAGNOSIS — Z5189 Encounter for other specified aftercare: Secondary | ICD-10-CM

## 2024-05-05 NOTE — Progress Notes (Signed)
 Summer Campbell is here in office today for a wound check and Blood Pressure check. Summer Campbell delivered 04/14/24 C/S.  Patient denies headache, vision changes, and dizziness. Patient states she is taking her BP medications (lisinopril  20 mg & Norvasc  10 mg) at night. Patient has pre-existing hypertension.  BP-159/103  I removed the pad from the incision and there is a small amount of brown drainage on the pad. Reviewed the chart with provider. Provider in to assess the site.The area of dehiscence is 1 cm in depth and 3 cm in width on the patient's left. Silver nitrate applied to the area. Patient needs to follow-up in 1 week for wound check.   Advised patient to follow-up with PCP regarding Blood pressure. Patient has PCP [facility]. Patient states she sent PCP a message.   Reviewed daily wound care with patient; patient verbalizes understanding. Reviewed s/s of preeclampsia and patient verbalized understanding. Patient states she has no further questions or concerns.   [name], RN 05/05/24

## 2024-05-06 ENCOUNTER — Encounter: Payer: Self-pay | Admitting: Family Medicine

## 2024-05-07 NOTE — Lactation Note (Signed)
 This note was copied from a baby's chart.  NICU Lactation Consultation Note  Patient Name: Summer Campbell Date: 05/07/2024 Age:33 wk.o.  Reason for consult: Follow-up assessment; Mother's request; Maternal endocrine disorder; Late-preterm 47-36.6wks; NICU baby; Primapara; 1st time breastfeeding; Breastfeeding assistance Type of Endocrine Disorder?: Diabetes; PCOS  SUBJECTIVE  LC in to assist with 2pm feeding at the breast.  Mom had pumped 3 hrs prior.  Mom with a low milk supply.  Encouraged to use the Medela Symphony pump, LC set it up.  Mom in recliner, baby showing feeding cues.  Baby positioned at right breast in football hold.  Several attempts to latch with LC assistance.  Baby opened her mouth and just sat there not sucking.  LC initiated the 16 mm NS and  baby able to attain a deep latch to areola with lips flanged.  Baby sucking with NNS pattern but quickly tired after 5 minutes.  RN to gavage full feeding.  Baby stayed latched, without sucking for 15 mins, LC noted slight tachypnea RR of 70's.  LC placed baby prone STS on Mom's chest where she fell asleep.  Mom encouraged to pump every 3 hrs.  Goal of 8 times per 24 hrs reviewed.  LC provided a handout on Ways to increase milk supply when baby is in the NICU.  LER resource.   OBJECTIVE Infant data: No data recorded O2 Device: Room Air  Infant feeding assessment IDFTS - Readiness: 2 IDFTS - Quality: 3   Maternal data: [id number] C-Section, Low Transverse Pumping frequency: 6 times per 24 hrs Pumped volume: 30 mL (30-45 ml) Flange Size: 18 Hands-free pumping top sizes: Large [name])  WIC Program: Yes WIC Referral Sent?: Yes What county?: [place] Pump: Received Stork Pump (Spectra  S2 Plus)  ASSESSMENT Infant: Latch: Repeated attempts needed to sustain latch, nipple held in mouth throughout feeding, stimulation needed to elicit sucking reflex. Audible Swallowing: None Type of Nipple: Everted at rest and  after stimulation Comfort (Breast/Nipple): Soft / non-tender Hold (Positioning): Assistance needed to correctly position infant at breast and maintain latch. LATCH Score: 6  Feeding Status: Scheduled 8-11-2-5 Feeding method: Breast Nipple Type: Dr. Jonna Fling Preemie  Maternal: Milk volume: Low  INTERVENTIONS/PLAN Interventions: Interventions: Breast feeding basics reviewed; Assisted with latch; Skin to skin; Breast massage; Hand express; Breast compression; Adjust position; Support pillows; Position options; DEBP; Education Tools: Pump; Flanges; Hands-free pumping top; Nipple Delene; Bottle Pump Education: Setup, frequency, and cleaning; Milk Storage Nipple shield size: 16  Plan: Consult Status: NICU follow-up NICU Follow-up type: Weekly NICU follow up   [name] 05/07/2024, 2:42 PM

## 2024-05-11 ENCOUNTER — Encounter: Admitting: Obstetrics and Gynecology

## 2024-05-11 ENCOUNTER — Ambulatory Visit

## 2024-05-12 ENCOUNTER — Ambulatory Visit (HOSPITAL_COMMUNITY)

## 2024-05-12 ENCOUNTER — Encounter (HOSPITAL_COMMUNITY): Payer: Self-pay

## 2024-05-12 ENCOUNTER — Ambulatory Visit (HOSPITAL_COMMUNITY): Admission: EM | Admit: 2024-05-12 | Discharge: 2024-05-12 | Disposition: A | Discharge status: Home / Self Care

## 2024-05-12 DIAGNOSIS — M546 Pain in thoracic spine: Secondary | ICD-10-CM

## 2024-05-12 DIAGNOSIS — M25642 Stiffness of left hand, not elsewhere classified: Secondary | ICD-10-CM | POA: Diagnosis not present

## 2024-05-12 DIAGNOSIS — M25674 Stiffness of right foot, not elsewhere classified: Secondary | ICD-10-CM | POA: Diagnosis not present

## 2024-05-12 DIAGNOSIS — M5431 Sciatica, right side: Secondary | ICD-10-CM
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
## 2024-05-16 ENCOUNTER — Other Ambulatory Visit: Payer: Self-pay | Admitting: Obstetrics and Gynecology

## 2024-05-16 DIAGNOSIS — E1159 Type 2 diabetes mellitus with other circulatory complications: Secondary | ICD-10-CM

## 2024-05-18 ENCOUNTER — Encounter

## 2024-05-18 NOTE — Lactation Note (Signed)
 This note was copied from a baby's chart.  NICU Lactation Consultation Note  Patient Name: Summer Campbell Date: 05/18/2024 Age:33 wk.o.  Reason for consult: Follow-up assessment; Breastfeeding assistance; Early term 29-38.6wks; NICU baby; 1st time breastfeeding; Primapara; Infant < 6lbs Type of Endocrine Disorder?: Diabetes; PCOS (T2DM)  SUBJECTIVE  LC in to visit with P1 Mom of baby Summer Campbell.  Baby had a swallow study today due to persistent tachypnea during PO feeds.  Baby is aspirating both thin and thickened liquid.  LC offered to assist Mom with latching baby to the breast as baby was actively showing feeding cues.  Mom has a low milk supply, but possibly a good flow rate for baby to handle without stressing.    Mom seated in chair and baby placed in football hold at the left breast.  Mom hand expressed milk onto nipple.  Pillow support under baby and assisted Mom to support baby's head from ear to ear, while supporting her breast with other hand.  Nipple to top lip and baby opened her mouth widely.  Several attempts after baby opening her mouth widely, but baby unable to sustain depth on the breast.  LC initiated a 16 mm NS and baby able to settle into a non-nutritive suck pattern without any overt signs of stress.  No swallowing identified.  After 8 minutes, baby noted to have slight tachypnea.  Baby taken off and placed prone on Mom's chest where she settled in looking contented.  Some cueing noted after a few minutes.  RN warmed baby's formula for Mom to offer the bottle.  LC provided another pumping band and assisted her to pump while pace bottle feeding.  Talked about the benefit of baby going to the breast and pumping after, in regards to boosting her milk supply.    Plan recommended- 1- STS with Summer Campbell 2- Offer the breast with strong feeding cues, using the nipple shield to aid in a deeper latch to the breast, ask for help prn 3- Pump both breasts after STS  and/or breastfeeding.  OBJECTIVE Infant data: No data recorded O2 Device: Room Air  Infant feeding assessment IDFS - Readiness: 1 IDFS - Quality: 5   Maternal data: [id number] C-Section, Low Transverse Pumping frequency: 7-8 times per 24 hrs Pumped volume: 30 mL (30-60 ml) Flange Size: 18 Hands-free pumping top sizes: Large Summer Campbell)  WIC Program: Yes WIC Referral Sent?: Yes What county?: [place] Pump: Received Stork Pump (Spectra  S2 Plus)  ASSESSMENT Infant: Latch: Repeated attempts needed to sustain latch, nipple held in mouth throughout feeding, stimulation needed to elicit sucking reflex. (use of nipple shield needed to sustain deeper latch) Audible Swallowing: None Type of Nipple: Everted at rest and after stimulation Comfort (Breast/Nipple): Soft / non-tender Hold (Positioning): Assistance needed to correctly position infant at breast and maintain latch. LATCH Score: 6  Feeding Status: Ad lib Feeding method: Breast Nipple Type: Dr. Jonna Campbell Preemie  Maternal: Milk volume: Low  INTERVENTIONS/PLAN Interventions: Interventions: Breast feeding basics reviewed; Assisted with latch; Skin to skin; Breast massage; Hand express; Adjust position; Breast compression; Support pillows; Position options; DEBP; Education; Pace feeding Tools: Flanges; Pump; Hands-free pumping top Pump Education: Setup, frequency, and cleaning; Milk Storage Nipple shield size: 16  Plan: Consult Status: NICU follow-up NICU Follow-up type: Weekly NICU follow up   Summer Campbell 05/18/2024, 4:49 PM

## 2024-05-19 ENCOUNTER — Ambulatory Visit (INDEPENDENT_AMBULATORY_CARE_PROVIDER_SITE_OTHER): Admitting: Obstetrics and Gynecology

## 2024-05-19 VITALS — BP 157/99 | HR 94 | Ht 64.0 in | Wt 329.0 lb

## 2024-05-19 DIAGNOSIS — Z5189 Encounter for other specified aftercare: Secondary | ICD-10-CM

## 2024-05-19 NOTE — Progress Notes (Addendum)
 Summer Campbell is here for a incision check. Alberto delivered C/S 04/14/24 (5wks PP).   [name] has a history of Hypertension. [name] is taking Lisinopril  40 mg & Norvasc  10 mg at night. Patient states [name], MD told her to take Nifedipine  for hypertension, but she hasn't started taking the medication. [place], [name] is currently managing her hypertension while [name], MD is on vacation. Patient denies Headache, vision changes and peripheral edema.   BP 157/99  The area of dehiscence is 3 cm long and healing appropriately. Provider in to assess wound. Patient needs to return in week for wound check. Patient aware of scheduled Postpartum visit 05/24/24.   Reviewed daily wound care and patient verbalizes understanding. Reviewed MAU precautions. Reviewed s/s pre-eclampsia and patient verbalizes understanding. Patient states no further questions or concerns.  [name], RN 05/19/24  Patient was assessed and managed by nursing staff during this encounter. I have reviewed the chart and agree with the documentation and plan. I have also made any necessary editorial changes.  Incision healing well.  No obvious drainage or induration.  Wound breakdown likely 2-3 weeks from full healing.  Jerilynn DELENA Buddle, MD 05/19/2024 11:51 AM

## 2024-05-23 NOTE — Lactation Note (Signed)
 This note was copied from a baby's chart.  NICU Lactation Consultation Note  Patient Name: Summer Campbell Date: 05/23/2024 Age:33 wk.o.  Reason for consult: Weekly NICU follow-up; Early term 37-38.6wks; Primapara; 1st time breastfeeding; Infant < 6lbs; Other (Comment) (cHTN, Pre-E, IUGR) Type of Endocrine Disorder?: Diabetes; PCOS (T2DM)  SUBJECTIVE Visited with family of 76 52/35 weeks old AGA NICU female Summer Campbell; Summer Campbell is a P1 and reported she continues pumping consistently and has been taking baby to breast occasionally using a NS # 16 she does better in cross cradle Vs football hold; her supply has not increased but remains stable, praised her for all her efforts.   Parents are taking baby Summer Campbell home today. Reviewed discharge education and the importance of consistent pumping even after feedings/attempts at the breast to protect her supply. Referral to [facility] sent for [facility] OP F/U. Provided additional NS # 16 per patient's request. FOB present. All questions and concerns answered, family to contact [facility] services PRN.  OBJECTIVE Infant data: Mother's Current Feeding Choice: Breast Milk and Formula  O2 Device: Room Air  Infant feeding assessment IDFS - Readiness: 1 IDFS - Quality: 2   Maternal data: [id number] C-Section, Low Transverse Pumping frequency: 7-8 times/24 hours Pumped volume: 10 mL (10-30 ml; up to 40 ml in the AM)  WIC Program: Yes WIC Referral Sent?: Yes What county?: [place] Pump: Received Stork Pump (Spectra  S2 Plus)  ASSESSMENT Infant: Feeding Status: Ad lib Feeding method: Bottle Nipple Type: Dr. Jonna Fling Preemie  Maternal: Milk volume: Low  INTERVENTIONS/PLAN Interventions: Interventions: Breast feeding basics reviewed; Coconut oil; DEBP; Education Discharge Education: Outpatient recommendation; Outpatient Epic message sent  Plan: Consult Status: Complete   Keylee Shrestha GORMAN Crate 05/23/2024, 4:13 PM

## 2024-05-24 ENCOUNTER — Other Ambulatory Visit: Payer: Self-pay

## 2024-05-24 ENCOUNTER — Encounter: Payer: Self-pay | Admitting: Family Medicine

## 2024-05-24 ENCOUNTER — Ambulatory Visit: Admitting: Family Medicine

## 2024-05-24 ENCOUNTER — Encounter: Payer: Self-pay | Admitting: General Practice

## 2024-05-24 VITALS — BP 145/91 | HR 98 | Wt 327.4 lb

## 2024-05-24 DIAGNOSIS — Z30017 Encounter for initial prescription of implantable subdermal contraceptive: Secondary | ICD-10-CM

## 2024-05-24 MED ORDER — ETONOGESTREL 68 MG ~~LOC~~ IMPL
68.0000 mg | DRUG_IMPLANT | Freq: Once | SUBCUTANEOUS | Status: AC
  Administered 2024-05-24: 68 mg via SUBCUTANEOUS

## 2024-05-24 NOTE — Progress Notes (Unsigned)
 "   Post Partum Visit Note  Summer Campbell is a 33 y.o. [phone] female who presents for a postpartum visit. She is 5 weeks postpartum following a primary cesarean section.  I have fully reviewed the prenatal and intrapartum course. The delivery was at [redacted]w[redacted]d gestational weeks.  Anesthesia: epidural. Postpartum course has been overall well. 12/21 was the first night home with baby so feels a little overwhelmed, but physically feels good. Baby is doing well. Was in NICU for 5 weeks, but discharged on 12/21. Baby is feeding by both breast and bottle - Similac Special Care Premature. Bleeding no bleeding. Bowel function is normal. Bladder function is normal. Patient is not sexually active. Contraception method is OCP (estrogen/progesterone), condoms and potential vasectomy. Postpartum depression screening: negative.   The pregnancy intention screening data noted above was reviewed. Potential methods of contraception were discussed. The patient elected to proceed with No data recorded.   Edinburgh Postnatal Depression Scale - 05/24/24 1010       Edinburgh Postnatal Depression Scale:  In the Past 7 Days   I have been able to laugh and see the funny side of things. 0    I have looked forward with enjoyment to things. 0    I have blamed myself unnecessarily when things went wrong. 0    I have been anxious or worried for no good reason. 0    I have felt scared or panicky for no good reason. 0    Things have been getting on top of me. 0    I have been so unhappy that I have had difficulty sleeping. 0    I have felt sad or miserable. 0    I have been so unhappy that I have been crying. 0    The thought of harming myself has occurred to me. 0    Edinburgh Postnatal Depression Scale Total 0          Health Maintenance Due  Topic Date Due   FOOT EXAM  Never done   OPHTHALMOLOGY EXAM  Never done   Diabetic kidney evaluation - Urine ACR  Never done   Pneumococcal Vaccine (2 of 2 - PCV) 02/24/2018    HPV VACCINES (1 - 3-dose SCDM series) Never done   COVID-19 Vaccine (6 - 2025-26 season) 02/02/2024    {Common ambulatory SmartLinks:19316}  Review of Systems {ros; complete:30496}  Objective:  BP (!) 146/95   Pulse 99   Wt (!) 327 lb 7 oz (148.5 kg)   Breastfeeding Yes   BMI 56.20 kg/m    General:  {gen appearance:16600}   Breasts:  {desc; normal/abnormal/not indicated:14647}  Lungs: {lung exam:16931}  Heart:  {heart exam:5510}  Abdomen: {abdomen exam:16834}   Wound {Wound assessment:11097}  GU exam:  {desc; normal/abnormal/not indicated:14647}       Assessment:    There are no diagnoses linked to this encounter.  *** postpartum exam.   Plan:   Essential components of care per ACOG recommendations:  1.  Mood and well being: Patient with {gen negative/positive:315881} depression screening today. Reviewed local resources for support.  - Patient tobacco use? {tobacco use:25506}  - hx of drug use? {yes/no:25505}    2. Infant care and feeding:  -Patient currently breastmilk feeding? {yes/no:25502}  -Social determinants of health (SDOH) reviewed in EPIC. No concerns***The following needs were identified***  3. Sexuality, contraception and birth spacing - Patient does not want a pregnancy in the next year.  Desired family size is 1 children.  -  Reviewed reproductive life planning. Reviewed contraceptive methods based on pt preferences and effectiveness.  Patient desired Hormonal Implant today.   - Discussed birth spacing of 18 months  4. Sleep and fatigue -Encouraged family/partner/community support of 4 hrs of uninterrupted sleep to help with mood and fatigue  5. Physical Recovery  - Discussed patients delivery and complications. She describes her labor as {description:25511} - Patient had a {CHL AMB [phone]}. Patient had a {laceration:25518} laceration. Perineal healing reviewed. Patient expressed understanding - Patient has urinary incontinence?  {yes/no:25515} - Patient {ACTION; IS/IS WNU:[id number]} safe to resume physical and sexual activity  6.  Health Maintenance - HM due items addressed {Yes or If no, why not?:20788} - Last pap smear No results found for: DIAGPAP Pap smear {done:10129} at today's visit.  -Breast Cancer screening indicated? {indicated:25516}  7. Chronic Disease/Pregnancy Condition follow up: {Follow up:25499}  - PCP follow up  Norleen LULLA Rover, MD [facility]  "

## 2024-05-25 ENCOUNTER — Ambulatory Visit: Admitting: Student

## 2024-05-25 ENCOUNTER — Encounter

## 2024-05-25 NOTE — Progress Notes (Signed)
 Nexplanon Insertion Procedure Patient identified, informed consent performed, consent signed.   Patient does understand that irregular bleeding is a very common side effect of this medication. She was advised to have backup contraception for one week after placement. Pregnancy test in clinic today was negative.  Appropriate time out taken.  Patient's left arm was prepped and draped in the usual sterile fashion. The ruler used to measure and mark insertion area.  Patient was prepped with alcohol swab and then injected with 3 ml of 1% lidocaine.  She was prepped with betadine, Nexplanon removed from packaging,  Device confirmed in needle, then inserted full length of needle and withdrawn per handbook instructions. Nexplanon was able to palpated in the patient's arm; patient palpated the insert herself. There was minimal blood loss.  Patient insertion site covered with guaze and a pressure bandage to reduce any bruising.  The patient tolerated the procedure well and was given post procedure instructions.

## 2024-06-01 ENCOUNTER — Inpatient Hospital Stay (HOSPITAL_COMMUNITY): Admit: 2024-06-01

## 2024-06-01 ENCOUNTER — Encounter: Admitting: Obstetrics and Gynecology

## 2024-06-07 ENCOUNTER — Other Ambulatory Visit: Payer: Self-pay

## 2024-06-07 ENCOUNTER — Ambulatory Visit (INDEPENDENT_AMBULATORY_CARE_PROVIDER_SITE_OTHER): Payer: Self-pay

## 2024-06-07 VITALS — BP 137/89 | HR 94 | Ht 64.0 in | Wt 333.1 lb

## 2024-06-07 DIAGNOSIS — Z0130 Encounter for examination of blood pressure without abnormal findings: Secondary | ICD-10-CM

## 2024-06-07 DIAGNOSIS — I152 Hypertension secondary to endocrine disorders: Secondary | ICD-10-CM

## 2024-06-07 NOTE — Progress Notes (Signed)
 Summer Campbell is here for a Blood Pressure Check. Patient with Hx of Chronic Hypertension. Patient is taking Procardia  60 mg at night and Lisinopril . Patient denies headache, vision changes and peripheral edema.   BP-148/75 Recheck- 137/89  Patient states no further questions or concerns. I explained if she has any questions or concerns to call the office.  [name], RN 06/07/24

## 2024-06-20 ENCOUNTER — Other Ambulatory Visit: Payer: Self-pay | Admitting: Nurse Practitioner

## 2024-06-20 NOTE — Telephone Encounter (Signed)
OTC medication
